# Patient Record
Sex: Female | Born: 1945 | Race: White | Hispanic: No | Smoking: Never smoker
Health system: Southern US, Community
[De-identification: ages and names within clinical notes are randomized; demographics above are authoritative.]

## PROBLEM LIST (undated history)

## (undated) DIAGNOSIS — M1990 Unspecified osteoarthritis, unspecified site: Secondary | ICD-10-CM

## (undated) DIAGNOSIS — I517 Cardiomegaly: Secondary | ICD-10-CM

## (undated) DIAGNOSIS — I10 Essential (primary) hypertension: Secondary | ICD-10-CM

## (undated) DIAGNOSIS — I509 Heart failure, unspecified: Secondary | ICD-10-CM

## (undated) DIAGNOSIS — G4730 Sleep apnea, unspecified: Secondary | ICD-10-CM

## (undated) DIAGNOSIS — T7840XA Allergy, unspecified, initial encounter: Secondary | ICD-10-CM

## (undated) DIAGNOSIS — E785 Hyperlipidemia, unspecified: Secondary | ICD-10-CM

## (undated) DIAGNOSIS — D649 Anemia, unspecified: Secondary | ICD-10-CM

## (undated) HISTORY — DX: Hyperlipidemia, unspecified: E78.5

## (undated) HISTORY — DX: Anemia, unspecified: D64.9

## (undated) HISTORY — DX: Allergy, unspecified, initial encounter: T78.40XA

## (undated) HISTORY — DX: Heart failure, unspecified: I50.9

## (undated) HISTORY — DX: Essential (primary) hypertension: I10

---

## 1974-09-23 HISTORY — PX: BREAST MASS EXCISION: SHX1267

## 1974-09-23 HISTORY — PX: BREAST EXCISIONAL BIOPSY: SUR124

## 2005-03-28 ENCOUNTER — Ambulatory Visit: Payer: Self-pay | Admitting: Family Medicine

## 2005-03-29 ENCOUNTER — Ambulatory Visit: Payer: Self-pay | Admitting: General Surgery

## 2005-03-29 HISTORY — PX: COLONOSCOPY: SHX174

## 2005-04-09 ENCOUNTER — Ambulatory Visit: Payer: Self-pay | Admitting: Family Medicine

## 2005-07-25 HISTORY — PX: OTHER SURGICAL HISTORY: SHX169

## 2006-04-10 ENCOUNTER — Ambulatory Visit: Payer: Self-pay | Admitting: Family Medicine

## 2007-02-19 DIAGNOSIS — R9431 Abnormal electrocardiogram [ECG] [EKG]: Secondary | ICD-10-CM | POA: Insufficient documentation

## 2007-04-14 ENCOUNTER — Ambulatory Visit: Payer: Self-pay | Admitting: Family Medicine

## 2007-04-16 ENCOUNTER — Ambulatory Visit: Payer: Self-pay | Admitting: Family Medicine

## 2007-06-12 DIAGNOSIS — G4700 Insomnia, unspecified: Secondary | ICD-10-CM | POA: Insufficient documentation

## 2008-04-15 DIAGNOSIS — D649 Anemia, unspecified: Secondary | ICD-10-CM | POA: Insufficient documentation

## 2008-04-26 ENCOUNTER — Ambulatory Visit: Payer: Self-pay | Admitting: Family Medicine

## 2009-02-21 ENCOUNTER — Ambulatory Visit: Payer: Self-pay | Admitting: Internal Medicine

## 2009-03-07 ENCOUNTER — Ambulatory Visit: Payer: Self-pay | Admitting: Internal Medicine

## 2009-03-23 ENCOUNTER — Ambulatory Visit: Payer: Self-pay | Admitting: Internal Medicine

## 2009-04-23 ENCOUNTER — Ambulatory Visit: Payer: Self-pay | Admitting: Internal Medicine

## 2009-04-25 ENCOUNTER — Ambulatory Visit: Payer: Self-pay | Admitting: Internal Medicine

## 2009-05-18 ENCOUNTER — Ambulatory Visit: Payer: Self-pay | Admitting: Family Medicine

## 2009-05-24 ENCOUNTER — Ambulatory Visit: Payer: Self-pay | Admitting: Internal Medicine

## 2009-06-23 ENCOUNTER — Ambulatory Visit: Payer: Self-pay | Admitting: Internal Medicine

## 2009-07-24 ENCOUNTER — Ambulatory Visit: Payer: Self-pay | Admitting: Internal Medicine

## 2010-08-01 ENCOUNTER — Ambulatory Visit: Payer: Self-pay | Admitting: Family Medicine

## 2011-10-29 ENCOUNTER — Ambulatory Visit: Payer: Self-pay | Admitting: Family Medicine

## 2012-09-28 LAB — HM PAP SMEAR: HM PAP: NEGATIVE

## 2012-10-01 ENCOUNTER — Ambulatory Visit: Payer: Self-pay | Admitting: Family Medicine

## 2012-10-13 ENCOUNTER — Ambulatory Visit: Payer: Self-pay | Admitting: Family Medicine

## 2012-10-29 ENCOUNTER — Ambulatory Visit: Payer: Self-pay | Admitting: Family Medicine

## 2012-11-04 ENCOUNTER — Ambulatory Visit: Payer: Self-pay | Admitting: Family Medicine

## 2013-12-16 ENCOUNTER — Ambulatory Visit: Payer: Self-pay | Admitting: Family Medicine

## 2014-05-02 DIAGNOSIS — E1169 Type 2 diabetes mellitus with other specified complication: Secondary | ICD-10-CM | POA: Insufficient documentation

## 2014-05-02 DIAGNOSIS — I152 Hypertension secondary to endocrine disorders: Secondary | ICD-10-CM | POA: Insufficient documentation

## 2014-05-02 DIAGNOSIS — I10 Essential (primary) hypertension: Secondary | ICD-10-CM | POA: Insufficient documentation

## 2014-05-02 DIAGNOSIS — G4730 Sleep apnea, unspecified: Secondary | ICD-10-CM | POA: Insufficient documentation

## 2014-05-02 DIAGNOSIS — E785 Hyperlipidemia, unspecified: Secondary | ICD-10-CM | POA: Insufficient documentation

## 2014-05-02 DIAGNOSIS — E1159 Type 2 diabetes mellitus with other circulatory complications: Secondary | ICD-10-CM | POA: Insufficient documentation

## 2014-05-02 DIAGNOSIS — E782 Mixed hyperlipidemia: Secondary | ICD-10-CM | POA: Insufficient documentation

## 2014-05-02 DIAGNOSIS — I429 Cardiomyopathy, unspecified: Secondary | ICD-10-CM | POA: Insufficient documentation

## 2014-09-06 DIAGNOSIS — I5022 Chronic systolic (congestive) heart failure: Secondary | ICD-10-CM | POA: Insufficient documentation

## 2014-12-20 ENCOUNTER — Ambulatory Visit: Payer: Self-pay | Admitting: Family Medicine

## 2014-12-21 ENCOUNTER — Ambulatory Visit: Admit: 2014-12-21 | Disposition: A | Discharge status: Home / Self Care | Payer: Self-pay | Admitting: Family Medicine

## 2015-01-17 ENCOUNTER — Ambulatory Visit (INDEPENDENT_AMBULATORY_CARE_PROVIDER_SITE_OTHER): Payer: Commercial Managed Care - HMO | Admitting: General Surgery

## 2015-01-17 ENCOUNTER — Encounter: Payer: Self-pay | Admitting: General Surgery

## 2015-01-17 VITALS — BP 122/70 | HR 68 | Resp 12 | Ht 66.0 in | Wt 139.0 lb

## 2015-01-17 DIAGNOSIS — Z1211 Encounter for screening for malignant neoplasm of colon: Secondary | ICD-10-CM | POA: Diagnosis not present

## 2015-01-17 MED ORDER — POLYETHYLENE GLYCOL 3350 17 GM/SCOOP PO POWD: 1.0000 | Freq: Once | ORAL | Status: DC

## 2015-01-17 NOTE — Progress Notes (Signed)
Patient ID: Dawn Roberts, female   DOB: 24-Feb-1946, 69 y.o.   MRN: [id number]  Chief Complaint  Patient presents with  . Colonoscopy    HPI Dawn Roberts is a 69 y.o. female. Here today for colonoscopy discussion. Her last colonoscopy was 2006. Denies family history of colon cancer. Bowels move daily and are regular. Denies any gastrointestinal issues.    HPI  Past Medical History  Diagnosis Date  . Hypertension   . Hyperlipidemia     Past Surgical History  Procedure Laterality Date  . Colonoscopy  03-29-05    Dr [name]  . Breast mass excision Right 1976    benign  . Venous closure procedure Right 07-25-05    Dr. [name]    Family History  Problem Relation Age of Onset  . Diabetes Father     Social History History  Substance Use Topics  . Smoking status: Never Smoker   . Smokeless tobacco: Never Used  . Alcohol Use: No    No Known Allergies  Current Outpatient Prescriptions  Medication Sig Dispense Refill  . Calcium-Vitamin D 600-200 MG-UNIT per tablet Take by mouth.    . carvedilol (COREG) 6.25 MG tablet Take 6.25 mg by mouth 2 (two) times daily.  5  . losartan (COZAAR) 50 MG tablet     . Multiple Vitamin (MULTI-VITAMINS) TABS Take by mouth.    . spironolactone (ALDACTONE) 25 MG tablet Take 25 mg by mouth daily.  5   No current facility-administered medications for this visit.    Review of Systems Review of Systems  Constitutional: Negative.   Respiratory: Negative.   Cardiovascular: Negative.   Gastrointestinal: Negative for diarrhea, constipation and blood in stool.    Blood pressure 122/70, pulse 68, resp. rate 12, height 5\' 6"  (1.676 m), weight 63.05 kg (139 lb).  Physical Exam Physical Exam  Constitutional: She is oriented to person, place, and time. She appears well-developed and well-nourished.  Eyes: Conjunctivae are normal. No scleral icterus.  Neck: Neck supple.  Cardiovascular: Normal rate, regular rhythm and normal heart sounds.    Pulmonary/Chest: Effort normal and breath sounds normal.  Abdominal: Soft. Normal appearance. There is no hepatomegaly. There is no tenderness.  Lymphadenopathy:    She has no cervical adenopathy.  Neurological: She is alert and oriented to person, place, and time.  Skin: Skin is warm and dry.    Data Reviewed Office notes, labs and prior colonoscopy.  Assessment    Stable physical exam.    Plan    Colonoscopy with possible biopsy/polypectomy prn: Information regarding the procedure, including its potential risks and complications (including but not limited to perforation of the bowel, which may require emergency surgery to repair, and bleeding) was verbally given to the patient. Educational information regarding lower instestinal endoscopy was given to the patient. Written instructions for how to complete the bowel prep using Miralax were provided. The importance of drinking ample fluids to avoid dehydration as a result of the prep emphasized.  Patient understands and is agreeable.     Patient is scheduled for a colonoscopy at [facility] on 02/01/15. She is aware to pre register with the hospital at least 2 days prior. She will only take her Carvedilol at 6 am with a small sip of water the morning of. Miralax prescription has been sent into her pharmacy. The patient is aware of date and all instructions.   PCP:  Otho Darner 01/17/2015, 9:50 AM

## 2015-01-17 NOTE — Patient Instructions (Addendum)
Colonoscopy A colonoscopy is an exam to look at the entire large intestine (colon). This exam can help find problems such as tumors, polyps, inflammation, and areas of bleeding. The exam takes about 1 hour.  LET [facility] CARE PROVIDER KNOW ABOUT:   Any allergies you have.  All medicines you are taking, including vitamins, herbs, eye drops, creams, and over-the-counter medicines.  Previous problems you or members of your family have had with the use of anesthetics.  Any blood disorders you have.  Previous surgeries you have had.  Medical conditions you have. RISKS AND COMPLICATIONS  Generally, this is a safe procedure. However, as with any procedure, complications can occur. Possible complications include:  Bleeding.  Tearing or rupture of the colon wall.  Reaction to medicines given during the exam.  Infection (rare). BEFORE THE PROCEDURE   Ask your health care provider about changing or stopping your regular medicines.  You may be prescribed an oral bowel prep. This involves drinking a large amount of medicated liquid, starting the day before your procedure. The liquid will cause you to have multiple loose stools until your stool is almost clear or light green. This cleans out your colon in preparation for the procedure.  Do not eat or drink anything else once you have started the bowel prep, unless your health care provider tells you it is safe to do so.  Arrange for someone to drive you home after the procedure. PROCEDURE   You will be given medicine to help you relax (sedative).  You will lie on your side with your knees bent.  A long, flexible tube with a light and camera on the end (colonoscope) will be inserted through the rectum and into the colon. The camera sends video back to a computer screen as it moves through the colon. The colonoscope also releases carbon dioxide gas to inflate the colon. This helps your health care provider see the area better.  During  the exam, your health care provider may take a small tissue sample (biopsy) to be examined under a microscope if any abnormalities are found.  The exam is finished when the entire colon has been viewed. AFTER THE PROCEDURE   Do not drive for 24 hours after the exam.  You may have a small amount of blood in your stool.  You may pass moderate amounts of gas and have mild abdominal cramping or bloating. This is caused by the gas used to inflate your colon during the exam.  Ask when your test results will be ready and how you will get your results. Make sure you get your test results. Document Released: 09/06/2000 Document Revised: 06/30/2013 Document Reviewed: 05/17/2013 [facility] Patient Information 2015 [place], [place]. This information is not intended to replace advice given to you by your health care provider. Make sure you discuss any questions you have with your health care provider.  Patient is scheduled for a colonoscopy at [facility] on 02/01/15. She is aware to pre register with the hospital at least 2 days prior. She will only take her Carvedilol at 6 am with a small sip of water the morning of. Miralax prescription has been sent into her pharmacy. The patient is aware of date and all instructions.

## 2015-01-18 DIAGNOSIS — J309 Allergic rhinitis, unspecified: Secondary | ICD-10-CM | POA: Insufficient documentation

## 2015-01-18 DIAGNOSIS — R7309 Other abnormal glucose: Secondary | ICD-10-CM | POA: Insufficient documentation

## 2015-01-18 DIAGNOSIS — Z6823 Body mass index (BMI) 23.0-23.9, adult: Secondary | ICD-10-CM | POA: Insufficient documentation

## 2015-01-18 DIAGNOSIS — R748 Abnormal levels of other serum enzymes: Secondary | ICD-10-CM | POA: Insufficient documentation

## 2015-01-23 ENCOUNTER — Other Ambulatory Visit: Payer: Self-pay | Admitting: General Surgery

## 2015-01-23 DIAGNOSIS — Z1211 Encounter for screening for malignant neoplasm of colon: Secondary | ICD-10-CM

## 2015-02-01 ENCOUNTER — Ambulatory Visit: Payer: Commercial Managed Care - HMO | Admitting: Anesthesiology

## 2015-02-01 ENCOUNTER — Ambulatory Visit
Admission: RE | Admit: 2015-02-01 | Discharge: 2015-02-01 | Disposition: A | Discharge status: Home / Self Care | Payer: Commercial Managed Care - HMO | Source: Ambulatory Visit | Attending: General Surgery | Admitting: General Surgery

## 2015-02-01 ENCOUNTER — Encounter
Admission: RE | Disposition: A | Discharge status: Home / Self Care | Payer: Self-pay | Source: Ambulatory Visit | Attending: General Surgery

## 2015-02-01 ENCOUNTER — Encounter: Payer: Self-pay | Admitting: General Surgery

## 2015-02-01 DIAGNOSIS — E785 Hyperlipidemia, unspecified: Secondary | ICD-10-CM | POA: Insufficient documentation

## 2015-02-01 DIAGNOSIS — Z1211 Encounter for screening for malignant neoplasm of colon: Secondary | ICD-10-CM | POA: Insufficient documentation

## 2015-02-01 DIAGNOSIS — Z79899 Other long term (current) drug therapy: Secondary | ICD-10-CM | POA: Diagnosis not present

## 2015-02-01 DIAGNOSIS — Z833 Family history of diabetes mellitus: Secondary | ICD-10-CM | POA: Insufficient documentation

## 2015-02-01 DIAGNOSIS — K5730 Diverticulosis of large intestine without perforation or abscess without bleeding: Secondary | ICD-10-CM | POA: Insufficient documentation

## 2015-02-01 DIAGNOSIS — I10 Essential (primary) hypertension: Secondary | ICD-10-CM | POA: Insufficient documentation

## 2015-02-01 DIAGNOSIS — Z9889 Other specified postprocedural states: Secondary | ICD-10-CM | POA: Insufficient documentation

## 2015-02-01 HISTORY — PX: COLONOSCOPY: SHX5424

## 2015-02-01 SURGERY — COLONOSCOPY
Anesthesia: General

## 2015-02-01 MED ORDER — PROPOFOL INFUSION 10 MG/ML OPTIME
INTRAVENOUS | Status: DC | PRN
  Administered 2015-02-01: 200 ug/kg/min via INTRAVENOUS

## 2015-02-01 MED ORDER — LACTATED RINGERS IV SOLN
INTRAVENOUS | Status: DC
  Administered 2015-02-01: 1000 mL via INTRAVENOUS

## 2015-02-01 MED ORDER — LIDOCAINE HCL (CARDIAC) 20 MG/ML IV SOLN
INTRAVENOUS | Status: DC | PRN
  Administered 2015-02-01: 60 mg via INTRAVENOUS

## 2015-02-01 MED ORDER — PROPOFOL 10 MG/ML IV BOLUS
INTRAVENOUS | Status: DC | PRN
  Administered 2015-02-01: 60 mg via INTRAVENOUS

## 2015-02-01 NOTE — Anesthesia Preprocedure Evaluation (Signed)
Anesthesia Evaluation  Patient identified by MRN, date of birth, ID band Patient awake    Reviewed: Allergy & Precautions, NPO status , Patient's Chart, lab work & pertinent test results  Airway Mallampati: II  TM Distance: >3 FB Neck ROM: Full    Dental  (+) Teeth Intact   Pulmonary sleep apnea (Pt Denies) ,          Cardiovascular hypertension, Pt. on medications and Pt. on home beta blockers +CHF (Pt with history of cardiomiopathy. Back to normal now.)     Neuro/Psych    GI/Hepatic   Endo/Other  diabetes (Pt denies)  Renal/GU      Musculoskeletal   Abdominal   Peds  Hematology  (+) anemia ,   Anesthesia Other Findings   Reproductive/Obstetrics                             Anesthesia Physical Anesthesia Plan  ASA: III  Anesthesia Plan: General   Post-op Pain Management:    Induction:   Airway Management Planned: Nasal Cannula  Additional Equipment:   Intra-op Plan:   Post-operative Plan:   Informed Consent: I have reviewed the patients History and Physical, chart, labs and discussed the procedure including the risks, benefits and alternatives for the proposed anesthesia with the patient or authorized representative who has indicated his/her understanding and acceptance.     Plan Discussed with: Anesthesiologist and Surgeon  Anesthesia Plan Comments:         Anesthesia Quick Evaluation

## 2015-02-01 NOTE — Interval H&P Note (Signed)
History and Physical Interval Note:  02/01/2015 8:02 AM  Dawn Roberts  has presented today for surgery, with the diagnosis of SCREENING  The various methods of treatment have been discussed with the patient and family. After consideration of risks, benefits and other options for treatment, the patient has consented to  Procedure(s): COLONOSCOPY (N/A) as a screening procedure.  The patient's history has been reviewed, patient examined, no change in status, stable for surgery.  I have reviewed the patient's chart and labs.  Questions were answered to the patient's satisfaction.     SANKAR,SEEPLAPUTHUR G

## 2015-02-01 NOTE — Op Note (Signed)
[facility] Patient Name: Dawn Roberts Procedure Date: 02/01/2015 8:08 AM MRN: [id number] Account #: [id number] Date of Birth: 04-28-1946 Admit Type: Outpatient Age: 69 Room: [facility] ENDO ROOM 1 Gender: Female Note Status: Finalized Procedure:         Colonoscopy Indications:       Screening for colorectal malignant neoplasm Providers:         Orlie Pollen, MD Referring MD:      Jerrell Belfast, MD (Referring MD) Medicines:         General Anesthesia Complications:     No immediate complications. Procedure:         Pre-Anesthesia Assessment:                    - General anesthesia under the supervision of an                     anesthesiologist was determined to be medically necessary                     for this procedure based on review of the patient's                     medical history, medications, and prior anesthesia history.                    After obtaining informed consent, the colonoscope was                     passed under direct vision. Throughout the procedure, the                     patient's blood pressure, pulse, and oxygen saturations                     were monitored continuously. The Colonoscope was                     introduced through the anus and advanced to the the cecum,                     identified by the ileocecal valve. The colonoscopy was                     performed without difficulty. The patient tolerated the                     procedure well. The quality of the bowel preparation was                     good. Findings:      The perianal and digital rectal examinations were normal.      Many small and large-mouthed diverticula were found in the sigmoid colon       and in the ascending colon.      The exam was otherwise without abnormality. Impression:        - Diverticulosis in the sigmoid colon and in the ascending                     colon.                    - The examination was otherwise  normal.                    -  No specimens collected. Recommendation:    - Use fiber, for example Citrucel, Fibercon, Konsyl or                     Metamucil. Procedure Code(s): --- Professional ---                    [phone], Colonoscopy, flexible; diagnostic, including                     collection of specimen(s) by brushing or washing, when                     performed (separate procedure) Diagnosis Code(s): --- Professional ---                    Z12.11, Encounter for screening for malignant neoplasm of                     colon                    K57.30, Diverticulosis of large intestine without                     perforation or abscess without bleeding CPT copyright 2014 American Medical Association. All rights reserved. The codes documented in this report are preliminary and upon coder review may  be revised to meet current compliance requirements. Orlie Pollen, MD 02/01/2015 8:53:30 AM This report has been signed electronically. Number of Addenda: 0 Note Initiated On: 02/01/2015 8:08 AM Scope Withdrawal Time: 0 hours 3 minutes 27 seconds  Total Procedure Duration: 0 hours 31 minutes 21 seconds       [facility]

## 2015-02-01 NOTE — H&P (View-Only) (Signed)
Patient ID: Dawn Roberts, female   DOB: 11-19-1945, 69 y.o.   MRN: [id number]  Chief Complaint  Patient presents with  . Colonoscopy    HPI Dawn Roberts is a 69 y.o. female. Here today for colonoscopy discussion. Her last colonoscopy was 2006. Denies family history of colon cancer. Bowels move daily and are regular. Denies any gastrointestinal issues.    HPI  Past Medical History  Diagnosis Date  . Hypertension   . Hyperlipidemia     Past Surgical History  Procedure Laterality Date  . Colonoscopy  03-29-05    Dr [name]  . Breast mass excision Right 1976    benign  . Venous closure procedure Right 07-25-05    Dr. [name]    Family History  Problem Relation Age of Onset  . Diabetes Father     Social History History  Substance Use Topics  . Smoking status: Never Smoker   . Smokeless tobacco: Never Used  . Alcohol Use: No    No Known Allergies  Current Outpatient Prescriptions  Medication Sig Dispense Refill  . Calcium-Vitamin D 600-200 MG-UNIT per tablet Take by mouth.    . carvedilol (COREG) 6.25 MG tablet Take 6.25 mg by mouth 2 (two) times daily.  5  . losartan (COZAAR) 50 MG tablet     . Multiple Vitamin (MULTI-VITAMINS) TABS Take by mouth.    . spironolactone (ALDACTONE) 25 MG tablet Take 25 mg by mouth daily.  5   No current facility-administered medications for this visit.    Review of Systems Review of Systems  Constitutional: Negative.   Respiratory: Negative.   Cardiovascular: Negative.   Gastrointestinal: Negative for diarrhea, constipation and blood in stool.    Blood pressure 122/70, pulse 68, resp. rate 12, height 5\' 6"  (1.676 m), weight 63.05 kg (139 lb).  Physical Exam Physical Exam  Constitutional: She is oriented to person, place, and time. She appears well-developed and well-nourished.  Eyes: Conjunctivae are normal. No scleral icterus.  Neck: Neck supple.  Cardiovascular: Normal rate, regular rhythm and normal heart sounds.    Pulmonary/Chest: Effort normal and breath sounds normal.  Abdominal: Soft. Normal appearance. There is no hepatomegaly. There is no tenderness.  Lymphadenopathy:    She has no cervical adenopathy.  Neurological: She is alert and oriented to person, place, and time.  Skin: Skin is warm and dry.    Data Reviewed Office notes, labs and prior colonoscopy.  Assessment    Stable physical exam.    Plan    Colonoscopy with possible biopsy/polypectomy prn: Information regarding the procedure, including its potential risks and complications (including but not limited to perforation of the bowel, which may require emergency surgery to repair, and bleeding) was verbally given to the patient. Educational information regarding lower instestinal endoscopy was given to the patient. Written instructions for how to complete the bowel prep using Miralax were provided. The importance of drinking ample fluids to avoid dehydration as a result of the prep emphasized.  Patient understands and is agreeable.     Patient is scheduled for a colonoscopy at [facility] on 02/01/15. She is aware to pre register with the hospital at least 2 days prior. She will only take her Carvedilol at 6 am with a small sip of water the morning of. Miralax prescription has been sent into her pharmacy. The patient is aware of date and all instructions.   PCP:  Otho Darner 01/17/2015, 9:50 AM

## 2015-02-01 NOTE — Anesthesia Postprocedure Evaluation (Signed)
  Anesthesia Post-op Note  Patient: Dawn Roberts  Procedure(s) Performed: Procedure(s): COLONOSCOPY (N/A)  Anesthesia type:General  Patient location: PACU  Post pain: Pain level controlled  Post assessment: Post-op Vital signs reviewed, Patient's Cardiovascular Status Stable, Respiratory Function Stable, Patent Airway and No signs of Nausea or vomiting  Post vital signs: Reviewed and stable  Last Vitals:  Filed Vitals:   02/01/15 0930  BP: 140/64  Pulse: 58  Temp:   Resp: 11    Level of consciousness: awake, alert  and patient cooperative  Complications: No apparent anesthesia complications

## 2015-02-01 NOTE — Transfer of Care (Signed)
Immediate Anesthesia Transfer of Care Note  Patient: Dawn Roberts  Procedure(s) Performed: Procedure(s): COLONOSCOPY (N/A)  Patient Location: PACU  Anesthesia Type:General  Level of Consciousness: sedated and responds to stimulation  Airway & Oxygen Therapy: Patient Spontanous Breathing and Patient connected to nasal cannula oxygen  Post-op Assessment: Report given to RN and Post -op Vital signs reviewed and stable  Post vital signs: stable  Last Vitals:  Filed Vitals:   02/01/15 0856  BP: 122/51  Pulse:   Temp: 36 C  Resp: 14    Complications: No apparent anesthesia complications

## 2015-02-01 NOTE — Anesthesia Postprocedure Evaluation (Signed)
  Anesthesia Post-op Note  Patient: Dawn Roberts  Procedure(s) Performed: Procedure(s): COLONOSCOPY (N/A)  Anesthesia type:General  Patient location: PACU  Post pain: Pain level controlled  Post assessment: Post-op Vital signs reviewed, Patient's Cardiovascular Status Stable, Respiratory Function Stable, Patent Airway and No signs of Nausea or vomiting  Post vital signs: Reviewed and stable  Last Vitals:  Filed Vitals:   02/01/15 0856  BP: 122/51  Pulse:   Temp: 36 C  Resp: 14    Level of consciousness: awake, alert  and patient cooperative  Complications: No apparent anesthesia complications

## 2015-02-15 ENCOUNTER — Other Ambulatory Visit: Payer: Self-pay

## 2015-03-06 ENCOUNTER — Encounter: Payer: Self-pay | Admitting: Family Medicine

## 2015-03-06 ENCOUNTER — Ambulatory Visit (INDEPENDENT_AMBULATORY_CARE_PROVIDER_SITE_OTHER): Payer: Commercial Managed Care - HMO | Admitting: Family Medicine

## 2015-03-06 VITALS — BP 108/60 | HR 60 | Temp 98.3°F | Resp 16 | Ht 65.0 in | Wt 131.0 lb

## 2015-03-06 DIAGNOSIS — E119 Type 2 diabetes mellitus without complications: Secondary | ICD-10-CM | POA: Diagnosis not present

## 2015-03-06 DIAGNOSIS — I10 Essential (primary) hypertension: Secondary | ICD-10-CM | POA: Diagnosis not present

## 2015-03-06 NOTE — Progress Notes (Signed)
Subjective:    Patient ID: Dawn Roberts, female    DOB: 10-17-1945, 69 y.o.   MRN: [id number]  HPI Comments: Pt's last A1C was on 12/07/2014, and the result was 6.7%.  Has given up her sweet tea.   Diabetes She presents for her follow-up diabetic visit. She has type 2 diabetes mellitus. The initial diagnosis of diabetes was made 3 months ago. There are no hypoglycemic associated symptoms. Pertinent negatives for hypoglycemia include no headaches or sweats. Pertinent negatives for diabetes include no blurred vision, no chest pain, no fatigue, no foot ulcerations, no polydipsia, no polyphagia, no polyuria, no visual change, no weakness and no weight loss. Current diabetic treatment includes diet (exercise). She is compliant with treatment all of the time (Pt has lost almost 20 lbs). She participates in exercise three times a week (walking). There is no compliance with monitoring of blood glucose. She does not see a podiatrist.Eye exam is not current.  Hypertension Pertinent negatives include no anxiety, blurred vision, chest pain, headaches, malaise/fatigue, neck pain, orthopnea, palpitations, peripheral edema, shortness of breath or sweats. Past treatments include alpha 1 blockers and angiotensin blockers (Carvedilol 6.25mg  bid, Cozaar 50mg ). Hypertensive end-organ damage includes heart failure.      Review of Systems  Constitutional: Negative for weight loss, malaise/fatigue and fatigue.  Eyes: Negative for blurred vision.  Respiratory: Negative for shortness of breath.   Cardiovascular: Negative for chest pain, palpitations and orthopnea.  Endocrine: Negative for polydipsia, polyphagia and polyuria.  Musculoskeletal: Negative for neck pain.  Neurological: Negative for weakness and headaches.     Patient Active Problem List   Diagnosis Date Noted  . Abnormal blood sugar 01/18/2015  . Allergic rhinitis 01/18/2015  . Body mass index (BMI) of 23.0-23.9 in adult 01/18/2015  .  Controlled diabetes mellitus type II without complication 09/47/0962  . Abnormal liver enzymes 01/18/2015  . Chronic systolic heart failure 83/66/2947  . Cardiomyopathy 05/02/2014  . Essential (primary) hypertension 05/02/2014  . Apnea, sleep 05/02/2014  . Combined fat and carbohydrate induced hyperlipemia 05/02/2014  . Absolute anemia 04/15/2008  . Cannot sleep 06/12/2007  . CCF (congestive cardiac failure) 05/11/2007  . Abnormal ECG 02/19/2007   Past Medical History  Diagnosis Date  . Hypertension   . Hyperlipidemia   . CHF (congestive heart failure)   . Allergy   . Anemia    Current Outpatient Prescriptions on File Prior to Visit  Medication Sig  . Calcium-Vitamin D 600-200 MG-UNIT per tablet Take 1 tablet by mouth 2 (two) times daily.   . carvedilol (COREG) 6.25 MG tablet Take 6.25 mg by mouth 2 (two) times daily.  . cyanocobalamin 1000 MCG tablet Take 1,000 mcg by mouth daily.   Marland Kitchen losartan (COZAAR) 50 MG tablet Take 50 mg by mouth daily.   . Multiple Vitamin (MULTI-VITAMINS) TABS Take 1 tablet by mouth daily.   Marland Kitchen spironolactone (ALDACTONE) 25 MG tablet Take 25 mg by mouth daily.   No current facility-administered medications on file prior to visit.   No Known Allergies Past Surgical History  Procedure Laterality Date  . Colonoscopy  03-29-05    Dr [name]  . Breast mass excision Right 1976    benign  . Venous closure procedure Right 07-25-05    Dr. [name]  . Colonoscopy N/A 02/01/2015    Procedure: COLONOSCOPY;  Surgeon: [name], MD;  Location: [facility] ENDOSCOPY;  Service: Endoscopy;  Laterality: N/A;   History   Social History  . Marital Status: Divorced  Spouse Name: N/A  . Number of Children: N/A  . Years of Education: N/A   Occupational History  . Not on file.   Social History Main Topics  . Smoking status: Never Smoker   . Smokeless tobacco: Never Used  . Alcohol Use: No  . Drug Use: No  . Sexual Activity: Not on file   Other Topics  Concern  . Not on file   Social History Narrative   Family History  Problem Relation Age of Onset  . Diabetes Father   . Ovarian cancer Sister           Objective:   Physical Exam  Constitutional: She is oriented to person, place, and time. She appears well-developed and well-nourished.  Neurological: She is alert and oriented to person, place, and time.  Psychiatric: She has a normal mood and affect. Her behavior is normal. Judgment and thought content normal.    BP 108/60 mmHg  Pulse 60  Temp(Src) 98.3 F (36.8 C) (Oral)  Resp 16  Ht 5\' 5"  (1.651 m)  Wt 131 lb (59.421 kg)  BMI 21.80 kg/m2       Assessment & Plan:  1. Controlled diabetes mellitus type II without complication Has lost 20 pounds. Too early for A1c.  Continue current lifestyle changes and recheck in 3 months.   2. Essential (primary) hypertension Condition is stable. Please continue current medication and  plan of care as noted.  Follow up with cardiology tomorrow.   Margarita Rana, MD

## 2015-03-07 DIAGNOSIS — R001 Bradycardia, unspecified: Secondary | ICD-10-CM | POA: Insufficient documentation

## 2015-03-16 ENCOUNTER — Encounter: Payer: Self-pay | Admitting: General Surgery

## 2015-06-12 ENCOUNTER — Encounter: Payer: Self-pay | Admitting: Family Medicine

## 2015-06-12 ENCOUNTER — Ambulatory Visit (INDEPENDENT_AMBULATORY_CARE_PROVIDER_SITE_OTHER): Payer: Commercial Managed Care - HMO | Admitting: Family Medicine

## 2015-06-12 VITALS — BP 116/60 | HR 60 | Temp 97.9°F | Resp 16 | Ht 66.0 in | Wt 129.0 lb

## 2015-06-12 DIAGNOSIS — Z23 Encounter for immunization: Secondary | ICD-10-CM | POA: Diagnosis not present

## 2015-06-12 DIAGNOSIS — E119 Type 2 diabetes mellitus without complications: Secondary | ICD-10-CM | POA: Diagnosis not present

## 2015-06-12 DIAGNOSIS — R7309 Other abnormal glucose: Secondary | ICD-10-CM | POA: Diagnosis not present

## 2015-06-12 DIAGNOSIS — I10 Essential (primary) hypertension: Secondary | ICD-10-CM | POA: Diagnosis not present

## 2015-06-12 LAB — POCT UA - MICROALBUMIN: MICROALBUMIN (UR) POC: 20 mg/L

## 2015-06-12 LAB — POCT GLYCOSYLATED HEMOGLOBIN (HGB A1C): HEMOGLOBIN A1C: 6.2

## 2015-06-12 NOTE — Progress Notes (Signed)
Patient ID: Dawn Roberts, female   DOB: 11-11-1945, 69 y.o.   MRN: [id number]        Patient: Dawn Roberts Female    DOB: [birth year]   69 y.o.   MRN: [id number] Visit Date: 06/12/2015  Today's Provider: Margarita Rana, MD   Chief Complaint  Patient presents with  . Diabetes  . Hypertension   Subjective:    HPI  Diabetes Mellitus Type II, Follow-up:   Lab Results  Component Value Date   HGBA1C 6.2 06/12/2015    Last seen for diabetes 3 months ago.  Management since then includes checking A1c 6.7% on 12/07/2014. She reports excellent compliance with treatment. She is not having side effects.  Current symptoms include none and have been stable. Home blood sugar records: are not being checked.  Episodes of hypoglycemia? no   Current Insulin Regimen: none Most Recent Eye Exam: over a yr ago. AEC Weight trend: decreasing steadily Prior visit with dietician: no Current diet: in general, a "healthy" diet   Current exercise: walking  Pertinent Labs: No results found for: CHOL, TRIG, CHOLHDL, CREATININE  Wt Readings from Last 3 Encounters:  06/12/15 129 lb (58.514 kg)  03/06/15 131 lb (59.421 kg)  02/01/15 136 lb (61.689 kg)    ------------------------------------------------------------------------      Hypertension, follow-up:  BP Readings from Last 3 Encounters:  06/12/15 116/60  03/06/15 108/60  02/01/15 140/64    She was last seen for hypertension 3 months ago.  BP at that visit was 108/60. Management changes since that visit include none. She reports excellent compliance with treatment. She is not having side effects.  She is exercising. She is adherent to low salt diet.   Outside blood pressures are are not being checked. She is experiencing none.  Patient denies chest pain.   Cardiovascular risk factors include none.   ------------------------------------------------------------------------       No Known Allergies Previous  Medications   CALCIUM-VITAMIN D 600-200 MG-UNIT PER TABLET    Take 1 tablet by mouth 2 (two) times daily.    CARVEDILOL (COREG) 6.25 MG TABLET    Take 6.25 mg by mouth 2 (two) times daily.   CYANOCOBALAMIN 1000 MCG TABLET    Take 1,000 mcg by mouth daily.    LOSARTAN (COZAAR) 50 MG TABLET    Take 50 mg by mouth daily.    MULTIPLE VITAMIN (MULTI-VITAMINS) TABS    Take 1 tablet by mouth daily.    SPIRONOLACTONE (ALDACTONE) 25 MG TABLET    Take 25 mg by mouth daily.   TRETINOIN (RETIN-A) 0.1 % CREAM    APPLY 1 APPLICATION ON THE SKIN AT BEDTIME    Review of Systems  Constitutional: Negative.   HENT: Negative.   Respiratory: Negative.   Cardiovascular: Negative.   Gastrointestinal: Negative.   Endocrine: Negative.   Genitourinary: Negative.   Skin: Negative.   Neurological: Negative.   Psychiatric/Behavioral: Negative.     Social History  Substance Use Topics  . Smoking status: Never Smoker   . Smokeless tobacco: Never Used  . Alcohol Use: No   Objective:   BP 116/60 mmHg  Pulse 60  Temp(Src) 97.9 F (36.6 C) (Oral)  Resp 16  Ht 5\' 6"  (1.676 m)  Wt 129 lb (58.514 kg)  BMI 20.83 kg/m2  Physical Exam  Constitutional: She appears well-developed and well-nourished.  Cardiovascular: Normal rate, regular rhythm, normal heart sounds and intact distal pulses.   Pulmonary/Chest: Effort normal and breath sounds normal.  Diabetic Foot Exam - Simple   Simple Foot Form  Diabetic Foot exam was performed with the following findings:  Yes 06/12/2015 11:58 AM  Visual Inspection  No deformities, no ulcerations, no other skin breakdown bilaterally:  Yes  Sensation Testing  Pulse Check  Posterior Tibialis and Dorsalis pulse intact bilaterally:  Yes  Comments           Assessment & Plan:     1. Abnormal blood sugar As below.   - POCT glycosylated hemoglobin (Hb A1C)  2. Essential (primary) hypertension Stable. Continue current medication.    3. Diabetes mellitus with no  complication Greatly improved. Continue current plan of care.  Monitor for signs and symptoms of type 1. Recheck in 4 months.  - POCT UA - Microalbumin  4. Need for influenza vaccination - Flu vaccine HIGH DOSE PF     Patient was seen and examined by Jerrell Belfast, MD, and note scribed by Lynford Humphrey, [place].   I have reviewed the document for accuracy and completeness and I agree with above. - Jerrell Belfast, MD     Margarita Rana, MD  [facility]

## 2015-10-17 ENCOUNTER — Ambulatory Visit: Payer: Commercial Managed Care - HMO | Admitting: Family Medicine

## 2015-10-24 ENCOUNTER — Encounter: Payer: Self-pay | Admitting: Family Medicine

## 2015-10-24 ENCOUNTER — Ambulatory Visit (INDEPENDENT_AMBULATORY_CARE_PROVIDER_SITE_OTHER): Payer: PPO | Admitting: Family Medicine

## 2015-10-24 VITALS — BP 136/60 | HR 68 | Temp 98.1°F | Resp 16 | Wt 129.0 lb

## 2015-10-24 DIAGNOSIS — I10 Essential (primary) hypertension: Secondary | ICD-10-CM

## 2015-10-24 DIAGNOSIS — E119 Type 2 diabetes mellitus without complications: Secondary | ICD-10-CM

## 2015-10-24 LAB — POCT GLYCOSYLATED HEMOGLOBIN (HGB A1C)
Est. average glucose Bld gHb Est-mCnc: 137
Hemoglobin A1C: 6.4

## 2015-10-24 NOTE — Progress Notes (Signed)
Subjective:    Patient ID: Dawn Roberts, female    DOB: 17-Dec-1945, 70 y.o.   MRN: [id number]  Diabetes She presents for her follow-up (Last A1C 06/12/2015 and was 6.2%) diabetic visit. There are no hypoglycemic associated symptoms. Pertinent negatives for diabetes include no blurred vision, no chest pain, no fatigue, no foot paresthesias, no foot ulcerations, no polydipsia, no polyphagia, no polyuria, no visual change and no weakness. Risk factors for coronary artery disease include diabetes mellitus, hypertension, post-menopausal and family history. When asked about current treatments, none were reported. She is following a generally healthy diet. She participates in exercise three times a week (joined a gym). Home blood sugar record trend: BS not being checked. An ACE inhibitor/angiotensin II receptor blocker is being taken. Eye exam is not current.  Hypertension This is a chronic problem. The problem is controlled. Pertinent negatives include no anxiety, blurred vision, chest pain, malaise/fatigue, neck pain, orthopnea, palpitations, peripheral edema or shortness of breath. Treatments tried: Carvedilol 6.25 mg BID, Losartan 50 mg, Spironolactone 25 mg. There are no compliance problems.     Review of Systems  Constitutional: Negative for malaise/fatigue and fatigue.  Eyes: Negative for blurred vision.  Respiratory: Negative for shortness of breath.   Cardiovascular: Negative for chest pain, palpitations and orthopnea.  Endocrine: Negative for polydipsia, polyphagia and polyuria.  Musculoskeletal: Negative for neck pain.  Neurological: Negative for weakness.   BP 136/60 mmHg  Pulse 68  Temp(Src) 98.1 F (36.7 C) (Oral)  Resp 16  Wt 129 lb (58.514 kg)   Patient Active Problem List   Diagnosis Date Noted  . Diabetes mellitus with no complication ([place]) [id number]  . Bradycardia 03/07/2015  . Abnormal blood sugar 01/18/2015  . Allergic rhinitis 01/18/2015  . Body mass index (BMI)  of 23.0-23.9 in adult 01/18/2015  . Controlled diabetes mellitus type II without complication ([place]) [id number]  . Abnormal liver enzymes 01/18/2015  . Chronic systolic heart failure ([place]) 09/06/2014  . Cardiomyopathy ([place]) 05/02/2014  . Essential (primary) hypertension 05/02/2014  . Apnea, sleep 05/02/2014  . Combined fat and carbohydrate induced hyperlipemia 05/02/2014  . Absolute anemia 04/15/2008  . Cannot sleep 06/12/2007  . CCF (congestive cardiac failure) ([place]) 05/11/2007  . Abnormal ECG 02/19/2007   Past Medical History  Diagnosis Date  . Hypertension   . Hyperlipidemia   . CHF (congestive heart failure) ([place])   . Allergy   . Anemia    Current Outpatient Prescriptions on File Prior to Visit  Medication Sig  . Calcium-Vitamin D 600-200 MG-UNIT per tablet Take 1 tablet by mouth 2 (two) times daily.   . carvedilol (COREG) 6.25 MG tablet Take 6.25 mg by mouth 2 (two) times daily.  . cyanocobalamin 1000 MCG tablet Take 1,000 mcg by mouth daily.   Marland Kitchen losartan (COZAAR) 50 MG tablet Take 50 mg by mouth daily.   . Multiple Vitamin (MULTI-VITAMINS) TABS Take 1 tablet by mouth daily.   Marland Kitchen spironolactone (ALDACTONE) 25 MG tablet Take 25 mg by mouth daily.  Marland Kitchen tretinoin (RETIN-A) 0.1 % cream APPLY 1 APPLICATION ON THE SKIN AT BEDTIME   No current facility-administered medications on file prior to visit.   No Known Allergies Past Surgical History  Procedure Laterality Date  . Colonoscopy  03-29-05    Dr [name]  . Breast mass excision Right 1976    benign  . Venous closure procedure Right 07-25-05    Dr. [name]  . Colonoscopy N/A 02/01/2015    Procedure: COLONOSCOPY;  Surgeon:  Seeplaputhur Robinette Haines, MD;  Location: [facility] ENDOSCOPY;  Service: Endoscopy;  Laterality: N/A;   Social History   Social History  . Marital Status: Divorced    Spouse Name: N/A  . Number of Children: N/A  . Years of Education: N/A   Occupational History  . Not on file.   Social History Main Topics  .  Smoking status: Never Smoker   . Smokeless tobacco: Never Used  . Alcohol Use: No  . Drug Use: No  . Sexual Activity: Not on file   Other Topics Concern  . Not on file   Social History Narrative   Family History  Problem Relation Age of Onset  . Diabetes Father   . Ovarian cancer Sister       Objective:   Physical Exam  Constitutional: She appears well-developed and well-nourished.  Cardiovascular: Normal rate and regular rhythm.   Pulmonary/Chest: Effort normal and breath sounds normal.  Psychiatric: She has a normal mood and affect. Her behavior is normal.  BP 136/60 mmHg  Pulse 68  Temp(Src) 98.1 F (36.7 C) (Oral)  Resp 16  Wt 129 lb (58.514 kg)     Assessment & Plan:  1. Controlled type 2 diabetes mellitus without complication, without long-term current use of insulin (HCC) Controlled. Continue current plan of care. Will recheck at CPE in May. - POCT glycosylated hemoglobin (Hb A1C) Results for orders placed or performed in visit on 10/24/15  POCT glycosylated hemoglobin (Hb A1C)  Result Value Ref Range   Hemoglobin A1C 6.4    Est. average glucose Bld gHb Est-mCnc 137     2. Essential (primary) hypertension Stable. Continue current medications and plan of care.   Patient seen and examined by Jerrell Belfast, MD, and note scribed by Renaldo Fiddler, CMA.  I have reviewed the document for accuracy and completeness and I agree with above. Jerrell Belfast, MD   Margarita Rana, MD

## 2015-12-25 ENCOUNTER — Encounter: Payer: Commercial Managed Care - HMO | Admitting: Family Medicine

## 2016-01-24 ENCOUNTER — Ambulatory Visit (INDEPENDENT_AMBULATORY_CARE_PROVIDER_SITE_OTHER): Payer: PPO | Admitting: Family Medicine

## 2016-01-24 ENCOUNTER — Encounter: Payer: PPO | Admitting: Family Medicine

## 2016-01-24 ENCOUNTER — Encounter: Payer: Self-pay | Admitting: Family Medicine

## 2016-01-24 VITALS — BP 120/60 | HR 60 | Temp 97.5°F | Resp 16 | Ht 65.0 in | Wt 127.0 lb

## 2016-01-24 DIAGNOSIS — E782 Mixed hyperlipidemia: Secondary | ICD-10-CM

## 2016-01-24 DIAGNOSIS — I5022 Chronic systolic (congestive) heart failure: Secondary | ICD-10-CM

## 2016-01-24 DIAGNOSIS — E119 Type 2 diabetes mellitus without complications: Secondary | ICD-10-CM

## 2016-01-24 DIAGNOSIS — Z0000 Encounter for general adult medical examination without abnormal findings: Secondary | ICD-10-CM | POA: Diagnosis not present

## 2016-01-24 DIAGNOSIS — I10 Essential (primary) hypertension: Secondary | ICD-10-CM

## 2016-01-24 DIAGNOSIS — I429 Cardiomyopathy, unspecified: Secondary | ICD-10-CM

## 2016-01-24 DIAGNOSIS — Z1239 Encounter for other screening for malignant neoplasm of breast: Secondary | ICD-10-CM

## 2016-01-24 NOTE — Progress Notes (Signed)
Patient ID: ELLYSE FAILLE, female   DOB: 12-26-1945, 70 y.o.   MRN: [id number]       Patient: Dawn Roberts, Female    DOB: 06/09/1946, 70 y.o.   MRN: [id number] Visit Date: 01/24/2016  Today's Provider: Margarita Rana, MD   Chief Complaint  Patient presents with  . Medicare Wellness   Subjective:    Annual wellness visit Dawn Roberts is a 70 y.o. female. She feels well. She reports exercising 3 times a week. She reports she is sleeping well.  12/07/14 AWE 10/08/12 Pap-neg; HPV-neg 12/21/14 Mammogram-BI-RADS 1 12/21/14 BMD-NORMAL 02/01/15 Colonoscopy-diverticulosis -----------------------------------------------------------  Review of Systems  Constitutional: Negative.   HENT: Negative.   Eyes: Negative.   Respiratory: Negative.   Cardiovascular: Negative.   Gastrointestinal: Negative.   Endocrine: Negative.   Genitourinary: Negative.   Musculoskeletal: Negative.   Skin: Negative.   Allergic/Immunologic: Negative.   Neurological: Negative.   Hematological: Negative.   Psychiatric/Behavioral: Negative.    Social History   Social History  . Marital Status: Divorced    Spouse Name: N/A  . Number of Children: N/A  . Years of Education: N/A   Occupational History  . Not on file.   Social History Main Topics  . Smoking status: Never Smoker   . Smokeless tobacco: Never Used  . Alcohol Use: No  . Drug Use: No  . Sexual Activity: Not on file   Other Topics Concern  . Not on file   Social History Narrative    Past Medical History  Diagnosis Date  . Hypertension   . Hyperlipidemia   . CHF (congestive heart failure) ([place])   . Allergy   . Anemia      Patient Active Problem List   Diagnosis Date Noted  . Diabetes mellitus with no complication ([place]) [id number]  . Bradycardia 03/07/2015  . Abnormal blood sugar 01/18/2015  . Allergic rhinitis 01/18/2015  . Body mass index (BMI) of 23.0-23.9 in adult 01/18/2015  . Controlled diabetes mellitus type II  without complication ([place]) [id number]  . Abnormal liver enzymes 01/18/2015  . Chronic systolic heart failure ([place]) 09/06/2014  . Cardiomyopathy ([place]) 05/02/2014  . Essential (primary) hypertension 05/02/2014  . Apnea, sleep 05/02/2014  . Combined fat and carbohydrate induced hyperlipemia 05/02/2014  . Absolute anemia 04/15/2008  . Cannot sleep 06/12/2007  . CCF (congestive cardiac failure) ([place]) 05/11/2007  . Abnormal ECG 02/19/2007    Past Surgical History  Procedure Laterality Date  . Colonoscopy  03-29-05    Dr [name]  . Breast mass excision Right 1976    benign  . Venous closure procedure Right 07-25-05    Dr. [name]  . Colonoscopy N/A 02/01/2015    Procedure: COLONOSCOPY;  Surgeon: [name], MD;  Location: [facility] ENDOSCOPY;  Service: Endoscopy;  Laterality: N/A;    Her family history includes Diabetes in her father; Ovarian cancer in her sister.    Previous Medications   CALCIUM-VITAMIN D 600-200 MG-UNIT PER TABLET    Take 1 tablet by mouth 2 (two) times daily.    CARVEDILOL (COREG) 6.25 MG TABLET    Take 6.25 mg by mouth 2 (two) times daily.   CYANOCOBALAMIN 1000 MCG TABLET    Take 1,000 mcg by mouth daily.    LOSARTAN (COZAAR) 50 MG TABLET    Take 50 mg by mouth daily.    MULTIPLE VITAMIN (MULTI-VITAMINS) TABS    Take 1 tablet by mouth daily.    SPIRONOLACTONE (ALDACTONE) 25 MG TABLET  Take 25 mg by mouth daily.   TRETINOIN (RETIN-A) 0.1 % CREAM    APPLY 1 APPLICATION ON THE SKIN AT BEDTIME    Patient Care Team: Margarita Rana, MD as PCP - General (Family Medicine) Seeplaputhur Robinette Haines, MD (General Surgery) Margarita Rana, MD as Referring Physician (Family Medicine)     Objective:   Vitals: BP 120/60 mmHg  Pulse 60  Temp(Src) 97.5 F (36.4 C) (Oral)  Resp 16  Ht 5\' 5"  (1.651 m)  Wt 127 lb (57.607 kg)  BMI 21.13 kg/m2  Physical Exam  Constitutional: She is oriented to person, place, and time. She appears well-developed and well-nourished.  HENT:    Head: Normocephalic and atraumatic.  Right Ear: Tympanic membrane, external ear and ear canal normal.  Left Ear: Tympanic membrane, external ear and ear canal normal.  Nose: Nose normal.  Mouth/Throat: Uvula is midline, oropharynx is clear and moist and mucous membranes are normal.  Eyes: Conjunctivae, EOM and lids are normal. Pupils are equal, round, and reactive to light.  Neck: Trachea normal and normal range of motion. Neck supple. Carotid bruit is not present. No thyroid mass and no thyromegaly present.  Cardiovascular: Normal rate, regular rhythm and normal heart sounds.   Pulmonary/Chest: Effort normal and breath sounds normal.  Abdominal: Soft. Normal appearance and bowel sounds are normal. There is no hepatosplenomegaly. There is no tenderness.  Genitourinary: No breast swelling, tenderness or discharge.  Musculoskeletal: Normal range of motion.  Lymphadenopathy:    She has no cervical adenopathy.    She has no axillary adenopathy.  Neurological: She is alert and oriented to person, place, and time. She has normal strength. No cranial nerve deficit.  Skin: Skin is warm, dry and intact.  Psychiatric: She has a normal mood and affect. Her speech is normal and behavior is normal. Judgment and thought content normal. Cognition and memory are normal.    Activities of Daily Living In your present state of health, do you have any difficulty performing the following activities: 01/24/2016 03/06/2015  Hearing? N N  Vision? N N  Difficulty concentrating or making decisions? N N  Walking or climbing stairs? N N  Dressing or bathing? N N  Doing errands, shopping? N N    Fall Risk Assessment Fall Risk  01/24/2016 03/06/2015  Falls in the past year? No No     Depression Screen PHQ 2/9 Scores 01/24/2016 03/06/2015  PHQ - 2 Score 0 0    Cognitive Testing - 6-CIT  Correct? Score   What year is it? yes 0 0 or 4  What month is it? yes 0 0 or 3  Memorize:    Dawn Roberts,  41,  [address],   [place],      What time is it? (within 1 hour) yes 0 0 or 3  Count backwards from 20 yes 0 0, 2, or 4  Name the months of the year yes 0 0, 2, or 4  Repeat name & address above yes 2 0, 2, 4, 6, 8, or 10       TOTAL SCORE  2/28   Interpretation:  Normal  Normal (0-7) Abnormal (8-28)     Assessment & Plan:     Annual Wellness Visit  Reviewed patient's Family Medical History Reviewed and updated list of patient's medical providers Assessment of cognitive impairment was done Assessed patient's functional ability Established a written schedule for health screening [place] Completed and Reviewed  Exercise Activities and Dietary  recommendations Goals    . HEMOGLOBIN A1C < 7.0       Immunization History  Administered Date(s) Administered  . Influenza, High Dose Seasonal PF 06/12/2015  . Pneumococcal Conjugate-13 12/07/2014  . Pneumococcal Polysaccharide-23 09/26/2011  . Tdap 02/10/2006      1. Medicare annual wellness visit, subsequent As above.  2. Breast cancer screening - MM DIGITAL SCREENING BILATERAL; Future  3. Essential (primary) hypertension - CBC with Differential/Platelet - Comprehensive metabolic panel - TSH  4. Diabetes mellitus with no complication (HCC) - Hemoglobin A1c  5. Combined fat and carbohydrate induced hyperlipemia - Lipid Panel With LDL/HDL Ratio - TSH  6. Chronic systolic heart failure (HCC) Stable. Patient advised to keep follow-up with Dr. Nehemiah Massed.  7. Cardiomyopathy ([place]) Stable. Patient advised to keep follow-up with Dr. Nehemiah Massed.    Patient seen and examined by Dr. Jerrell Belfast, and note scribed by Philbert Riser. [place], CMA.  I have reviewed the document for accuracy and completeness and I agree with above. Jerrell Belfast, MD   Margarita Rana, MD   ------------------------------------------------------------------------------------------------------------

## 2016-01-25 ENCOUNTER — Telehealth: Payer: Self-pay

## 2016-01-25 LAB — COMPREHENSIVE METABOLIC PANEL
A/G RATIO: 2.2 (ref 1.2–2.2)
ALK PHOS: 33 IU/L — AB (ref 39–117)
ALT: 17 IU/L (ref 0–32)
AST: 17 IU/L (ref 0–40)
Albumin: 4.3 g/dL (ref 3.5–4.8)
BILIRUBIN TOTAL: 0.3 mg/dL (ref 0.0–1.2)
BUN/Creatinine Ratio: 35 — ABNORMAL HIGH (ref 12–28)
BUN: 30 mg/dL — AB (ref 8–27)
CO2: 24 mmol/L (ref 18–29)
CREATININE: 0.85 mg/dL (ref 0.57–1.00)
Calcium: 9.8 mg/dL (ref 8.7–10.3)
Chloride: 103 mmol/L (ref 96–106)
GFR calc Af Amer: 80 mL/min/{1.73_m2} (ref >59)
GFR, EST NON AFRICAN AMERICAN: 70 mL/min/{1.73_m2} (ref >59)
Globulin, Total: 2 g/dL (ref 1.5–4.5)
Glucose: 99 mg/dL (ref 65–99)
Potassium: 5.2 mmol/L (ref 3.5–5.2)
SODIUM: 144 mmol/L (ref 134–144)
TOTAL PROTEIN: 6.3 g/dL (ref 6.0–8.5)

## 2016-01-25 LAB — CBC WITH DIFFERENTIAL/PLATELET
BASOS: 1 %
Basophils Absolute: 0 10*3/uL (ref 0.0–0.2)
EOS (ABSOLUTE): 0 10*3/uL (ref 0.0–0.4)
Eos: 1 %
Hematocrit: 36.2 % (ref 34.0–46.6)
Hemoglobin: 11.7 g/dL (ref 11.1–15.9)
IMMATURE GRANS (ABS): 0 10*3/uL (ref 0.0–0.1)
Immature Granulocytes: 0 %
Lymphocytes Absolute: 2 10*3/uL (ref 0.7–3.1)
Lymphs: 46 %
MCH: 29.1 pg (ref 26.6–33.0)
MCHC: 32.3 g/dL (ref 31.5–35.7)
MCV: 90 fL (ref 79–97)
MONOS ABS: 0.2 10*3/uL (ref 0.1–0.9)
Monocytes: 6 %
NEUTROS ABS: 1.9 10*3/uL (ref 1.4–7.0)
Neutrophils: 46 %
PLATELETS: 251 10*3/uL (ref 150–379)
RBC: 4.02 x10E6/uL (ref 3.77–5.28)
RDW: 13.6 % (ref 12.3–15.4)
WBC: 4.1 10*3/uL (ref 3.4–10.8)

## 2016-01-25 LAB — HEMOGLOBIN A1C
ESTIMATED AVERAGE GLUCOSE: 134 mg/dL
HEMOGLOBIN A1C: 6.3 % — AB (ref 4.8–5.6)

## 2016-01-25 LAB — LIPID PANEL WITH LDL/HDL RATIO
Cholesterol, Total: 144 mg/dL (ref 100–199)
HDL: 51 mg/dL (ref >39)
LDL Calculated: 85 mg/dL (ref 0–99)
LDL/HDL RATIO: 1.7 ratio (ref 0.0–3.2)
TRIGLYCERIDES: 41 mg/dL (ref 0–149)
VLDL CHOLESTEROL CAL: 8 mg/dL (ref 5–40)

## 2016-01-25 LAB — TSH: TSH: 0.939 u[IU]/mL (ref 0.450–4.500)

## 2016-01-25 NOTE — Telephone Encounter (Signed)
-----   Message from Margarita Rana, MD sent at 01/25/2016 10:36 AM EDT ----- Labs stable. Please notify patient. Thanks.

## 2016-01-25 NOTE — Telephone Encounter (Signed)
Patient advised as directed below. Patient verbalized understanding.  

## 2016-01-29 ENCOUNTER — Telehealth: Payer: Self-pay | Admitting: Family Medicine

## 2016-01-31 ENCOUNTER — Ambulatory Visit
Admission: RE | Admit: 2016-01-31 | Discharge: 2016-01-31 | Disposition: A | Discharge status: Home / Self Care | Payer: PPO | Source: Ambulatory Visit | Attending: Family Medicine | Admitting: Family Medicine

## 2016-01-31 ENCOUNTER — Other Ambulatory Visit: Payer: Self-pay | Admitting: Family Medicine

## 2016-01-31 DIAGNOSIS — Z1239 Encounter for other screening for malignant neoplasm of breast: Secondary | ICD-10-CM

## 2016-01-31 DIAGNOSIS — Z1231 Encounter for screening mammogram for malignant neoplasm of breast: Secondary | ICD-10-CM | POA: Diagnosis not present

## 2016-03-06 DIAGNOSIS — I5022 Chronic systolic (congestive) heart failure: Secondary | ICD-10-CM | POA: Diagnosis not present

## 2016-03-06 DIAGNOSIS — E782 Mixed hyperlipidemia: Secondary | ICD-10-CM | POA: Diagnosis not present

## 2016-03-06 DIAGNOSIS — I10 Essential (primary) hypertension: Secondary | ICD-10-CM | POA: Diagnosis not present

## 2016-03-06 DIAGNOSIS — G4730 Sleep apnea, unspecified: Secondary | ICD-10-CM | POA: Diagnosis not present

## 2016-03-06 DIAGNOSIS — R072 Precordial pain: Secondary | ICD-10-CM | POA: Diagnosis not present

## 2016-03-06 DIAGNOSIS — I429 Cardiomyopathy, unspecified: Secondary | ICD-10-CM | POA: Diagnosis not present

## 2016-03-06 DIAGNOSIS — R001 Bradycardia, unspecified: Secondary | ICD-10-CM | POA: Diagnosis not present

## 2016-03-27 ENCOUNTER — Encounter: Payer: Self-pay | Admitting: Family Medicine

## 2016-03-27 ENCOUNTER — Ambulatory Visit (INDEPENDENT_AMBULATORY_CARE_PROVIDER_SITE_OTHER): Payer: PPO | Admitting: Family Medicine

## 2016-03-27 VITALS — BP 100/58 | HR 72 | Temp 97.8°F | Resp 16 | Ht 65.0 in | Wt 126.0 lb

## 2016-03-27 DIAGNOSIS — L259 Unspecified contact dermatitis, unspecified cause: Secondary | ICD-10-CM | POA: Diagnosis not present

## 2016-03-27 MED ORDER — PREDNISONE 20 MG PO TABS: ORAL_TABLET | ORAL | Status: DC

## 2016-03-27 NOTE — Progress Notes (Signed)
Subjective:     Patient ID: Dawn Roberts, female   DOB: [birth year], 70 y.o.   MRN: [id number]  HPI  Chief Complaint  Patient presents with  . Rash    on arms x's 3  States she was pulling out weeds and vines in her yard on 7/1. Rash appeared on her arms and subsequently on her right side of her face on 7/3.   Review of Systems     Objective:   Physical Exam  Constitutional: She appears well-developed and well-nourished. No distress.  Skin:  Right face with patch of rash. Upper extremities with vesicles and papules (right > left).       Assessment:    1. Contact dermatitis - predniSONE (DELTASONE) 20 MG tablet; Taper as follows: 3 pills for 4 days, two pills for 4 days, one pill for four days  Dispense: 24 tablet; Refill: 0    Plan:   Discussed use of calamine and oral Benadryl.

## 2016-03-27 NOTE — Patient Instructions (Signed)
Use plain calamine lotion for itching and reducing blisters. May take Benadryl to night to help with itching and sleep.

## 2016-05-15 DIAGNOSIS — L578 Other skin changes due to chronic exposure to nonionizing radiation: Secondary | ICD-10-CM | POA: Diagnosis not present

## 2016-05-15 DIAGNOSIS — I788 Other diseases of capillaries: Secondary | ICD-10-CM | POA: Diagnosis not present

## 2016-05-15 DIAGNOSIS — D1800 Hemangioma unspecified site: Secondary | ICD-10-CM | POA: Diagnosis not present

## 2016-05-15 DIAGNOSIS — I8393 Asymptomatic varicose veins of bilateral lower extremities: Secondary | ICD-10-CM | POA: Diagnosis not present

## 2016-05-15 DIAGNOSIS — L720 Epidermal cyst: Secondary | ICD-10-CM | POA: Diagnosis not present

## 2016-05-15 DIAGNOSIS — L821 Other seborrheic keratosis: Secondary | ICD-10-CM | POA: Diagnosis not present

## 2016-05-15 DIAGNOSIS — D692 Other nonthrombocytopenic purpura: Secondary | ICD-10-CM | POA: Diagnosis not present

## 2016-05-15 DIAGNOSIS — L814 Other melanin hyperpigmentation: Secondary | ICD-10-CM | POA: Diagnosis not present

## 2016-05-15 DIAGNOSIS — Z1283 Encounter for screening for malignant neoplasm of skin: Secondary | ICD-10-CM | POA: Diagnosis not present

## 2016-05-15 DIAGNOSIS — B351 Tinea unguium: Secondary | ICD-10-CM | POA: Diagnosis not present

## 2016-05-15 DIAGNOSIS — D229 Melanocytic nevi, unspecified: Secondary | ICD-10-CM | POA: Diagnosis not present

## 2016-05-15 DIAGNOSIS — R21 Rash and other nonspecific skin eruption: Secondary | ICD-10-CM | POA: Diagnosis not present

## 2016-05-21 DIAGNOSIS — H43813 Vitreous degeneration, bilateral: Secondary | ICD-10-CM | POA: Diagnosis not present

## 2016-08-30 DIAGNOSIS — I5022 Chronic systolic (congestive) heart failure: Secondary | ICD-10-CM | POA: Diagnosis not present

## 2016-08-30 DIAGNOSIS — I10 Essential (primary) hypertension: Secondary | ICD-10-CM | POA: Diagnosis not present

## 2016-08-30 DIAGNOSIS — E782 Mixed hyperlipidemia: Secondary | ICD-10-CM | POA: Diagnosis not present

## 2016-08-30 DIAGNOSIS — R072 Precordial pain: Secondary | ICD-10-CM | POA: Diagnosis not present

## 2016-09-25 DIAGNOSIS — R072 Precordial pain: Secondary | ICD-10-CM | POA: Diagnosis not present

## 2016-09-25 DIAGNOSIS — I5022 Chronic systolic (congestive) heart failure: Secondary | ICD-10-CM | POA: Diagnosis not present

## 2016-10-01 DIAGNOSIS — G4730 Sleep apnea, unspecified: Secondary | ICD-10-CM | POA: Diagnosis not present

## 2016-10-01 DIAGNOSIS — I5022 Chronic systolic (congestive) heart failure: Secondary | ICD-10-CM | POA: Diagnosis not present

## 2016-10-01 DIAGNOSIS — I10 Essential (primary) hypertension: Secondary | ICD-10-CM | POA: Diagnosis not present

## 2016-10-01 DIAGNOSIS — E782 Mixed hyperlipidemia: Secondary | ICD-10-CM | POA: Diagnosis not present

## 2016-12-26 DIAGNOSIS — L578 Other skin changes due to chronic exposure to nonionizing radiation: Secondary | ICD-10-CM | POA: Diagnosis not present

## 2016-12-26 DIAGNOSIS — L570 Actinic keratosis: Secondary | ICD-10-CM | POA: Diagnosis not present

## 2017-01-06 ENCOUNTER — Encounter: Payer: Self-pay | Admitting: Physician Assistant

## 2017-01-06 ENCOUNTER — Ambulatory Visit (INDEPENDENT_AMBULATORY_CARE_PROVIDER_SITE_OTHER): Payer: PPO | Admitting: Physician Assistant

## 2017-01-06 VITALS — BP 130/62 | HR 60 | Temp 98.1°F | Resp 16 | Ht 66.0 in | Wt 140.8 lb

## 2017-01-06 DIAGNOSIS — M62830 Muscle spasm of back: Secondary | ICD-10-CM

## 2017-01-06 DIAGNOSIS — S39012A Strain of muscle, fascia and tendon of lower back, initial encounter: Secondary | ICD-10-CM | POA: Diagnosis not present

## 2017-01-06 MED ORDER — PREDNISONE 20 MG PO TABS: ORAL_TABLET | ORAL | 0 refills | Status: DC

## 2017-01-06 MED ORDER — CYCLOBENZAPRINE HCL 5 MG PO TABS: 5.0000 mg | ORAL_TABLET | Freq: Every day | ORAL | 0 refills | Status: DC

## 2017-01-06 NOTE — Progress Notes (Signed)
Patient: Dawn Roberts Female    DOB: 1945-12-30   71 y.o.   MRN: [id number] Visit Date: 01/06/2017  Today's Provider: Mar Daring, PA-C   Chief Complaint  Patient presents with  . Spasms   Subjective:    HPI Patient here today C/O of lower back pain on left side x's months, patient reports that she was having pain first thing in the morning and during the night. Patient reports that her pain would usually improve with activity but today it has not improved. Patient reports she took 3 tablets of Advil, reports no improvement. Patient denies any injury. She did do a lot of yard work on Saturday. Sunday she noticed her back was tight but not painful so she did her normal activities. Then overnight she developed what she describes as a spasm in the left lower back that has not subsided. Patient reports that a few years ago she was told by chiropractic that she has scoliosis in lower back.    No Known Allergies   Current Outpatient Prescriptions:  .  aspirin EC 81 MG tablet, Take 1 tablet by mouth daily., Disp: , Rfl:  .  Calcium-Vitamin D 600-200 MG-UNIT per tablet, Take 1 tablet by mouth 2 (two) times daily. , Disp: , Rfl:  .  carvedilol (COREG) 6.25 MG tablet, Take 6.25 mg by mouth 2 (two) times daily., Disp: , Rfl: 5 .  cyanocobalamin 1000 MCG tablet, Take 1,000 mcg by mouth daily. , Disp: , Rfl:  .  losartan (COZAAR) 50 MG tablet, Take 50 mg by mouth daily. , Disp: , Rfl:  .  Multiple Vitamin (MULTI-VITAMINS) TABS, Take 1 tablet by mouth daily. , Disp: , Rfl:  .  spironolactone (ALDACTONE) 25 MG tablet, Take 25 mg by mouth daily., Disp: , Rfl: 5 .  tretinoin (RETIN-A) 0.1 % cream, APPLY 1 APPLICATION ON THE SKIN AT BEDTIME, Disp: , Rfl: 6  Review of Systems  Constitutional: Negative.   Respiratory: Negative.   Cardiovascular: Negative.   Gastrointestinal: Negative.   Musculoskeletal: Positive for back pain and myalgias. Negative for gait problem.  Neurological:  Negative for dizziness, weakness, light-headedness and numbness.    Social History  Substance Use Topics  . Smoking status: Never Smoker  . Smokeless tobacco: Never Used  . Alcohol use No   Objective:   BP 130/62 (BP Location: Right Arm, Patient Position: Sitting, Cuff Size: Large)   Pulse 60   Temp 98.1 F (36.7 C) (Oral)   Resp 16   Ht 5\' 6"  (1.676 m)   Wt 140 lb 12.8 oz (63.9 kg)   SpO2 98%   BMI 22.73 kg/m  Vitals:   01/06/17 1554  BP: 130/62  Pulse: 60  Resp: 16  Temp: 98.1 F (36.7 C)  TempSrc: Oral  SpO2: 98%  Weight: 140 lb 12.8 oz (63.9 kg)  Height: 5\' 6"  (1.676 m)     Physical Exam  Constitutional: She appears well-developed and well-nourished. No distress.  Neck: Normal range of motion. Neck supple.  Cardiovascular: Normal rate, regular rhythm and normal heart sounds.  Exam reveals no gallop and no friction rub.   No murmur heard. Pulmonary/Chest: Effort normal and breath sounds normal. No respiratory distress. She has no wheezes. She has no rales.  Musculoskeletal:       Thoracic back: Normal.       Lumbar back: She exhibits decreased range of motion (forward flexion and lateral flexion bother her the most),  tenderness (left paraspinal muscles) and spasm (left sided). She exhibits no bony tenderness and no swelling.  Skin: She is not diaphoretic.  Vitals reviewed.     Assessment & Plan:     1. Muscle spasm of back Worsening. Prednisone and flexeril given as below. She may apply moist heat to the area. Epsom salt soaks can help as well. She is to call if symptoms worsen or fail to improve.  - predniSONE (DELTASONE) 20 MG tablet; Taper as follows: 3 pills for 4 days, two pills for 4 days, one pill for four days  Dispense: 24 tablet; Refill: 0 - cyclobenzaprine (FLEXERIL) 5 MG tablet; Take 1 tablet (5 mg total) by mouth at bedtime.  Dispense: 15 tablet; Refill: 0  2. Strain of lumbar region, initial encounter See above medical treatment plan. -  predniSONE (DELTASONE) 20 MG tablet; Taper as follows: 3 pills for 4 days, two pills for 4 days, one pill for four days  Dispense: 24 tablet; Refill: 0 - cyclobenzaprine (FLEXERIL) 5 MG tablet; Take 1 tablet (5 mg total) by mouth at bedtime.  Dispense: 15 tablet; Refill: 0       Mar Daring, PA-C  [facility]

## 2017-01-06 NOTE — Patient Instructions (Signed)
Low Back Strain A strain is a stretch or tear in a muscle or the strong cords of tissue that attach muscle to bone (tendons). Strains of the lower back (lumbar spine) are a common cause of low back pain. A strain occurs when muscles or tendons are torn or are stretched beyond their limits. The muscles may become inflamed, resulting in pain and sudden muscle tightening (spasms). A strain can happen suddenly due to an injury (trauma), or it can develop gradually due to overuse. There are three types of strains:  Grade 1 is a mild strain involving a minor tear of the muscle fibers or tendons. This may cause some pain but no loss of muscle strength.  Grade 2 is a moderate strain involving a partial tear of the muscle fibers or tendons. This causes more severe pain and some loss of muscle strength.  Grade 3 is a severe strain involving a complete tear of the muscle or tendon. This causes severe pain and complete or nearly complete loss of muscle strength. What are the causes? This condition may be caused by:  Trauma, such as a fall or a hit to the body.  Twisting or overstretching the back. This may result from doing activities that require a lot of energy, such as lifting heavy objects. What increases the risk? The following factors may increase your risk of getting this condition:  Playing contact sports.  Participating in sports or activities that put excessive stress on the back and require a lot of bending and twisting, including:  Lifting weights or heavy objects.  Gymnastics.  Soccer.  Figure skating.  Snowboarding.  Being overweight or obese.  Having poor strength and flexibility. What are the signs or symptoms? Symptoms of this condition may include:  Sharp or dull pain in the lower back that does not go away. Pain may extend to the buttocks.  Stiffness.  Limited range of motion.  Inability to stand up straight due to stiffness or pain.  Muscle spasms. How is this  diagnosed?   This condition may be diagnosed based on:  Your symptoms.  Your medical history.  A physical exam.  Your health care provider may push on certain areas of your back to determine the source of your pain.  You may be asked to bend forward, backward, and side to side to assess the severity of your pain and your range of motion.  Imaging tests, such as:  X-rays.  MRI. How is this treated? Treatment for this condition may include:  Applying heat and cold to the affected area.  Medicines to help relieve pain and to relax your muscles (muscle relaxants).  NSAIDs to help reduce swelling and discomfort.  Physical therapy. When your symptoms improve, it is important to gradually return to your normal routine as soon as possible to reduce pain, avoid stiffness, and avoid loss of muscle strength. Generally, symptoms should improve within 6 weeks of treatment. However, recovery time varies. Follow these instructions at home: Managing pain, stiffness, and swelling  If directed, apply ice to the injured area during the first 24 hours after your injury.  Put ice in a plastic bag.  Place a towel between your skin and the bag.  Leave the ice on for 20 minutes, 2-3 times a day.  If directed, apply heat to the affected area as often as told by your health care provider. Use the heat source that your health care provider recommends, such as a moist heat pack or a heating pad.    Place a towel between your skin and the heat source.  Leave the heat on for 20-30 minutes.  Remove the heat if your skin turns bright red. This is especially important if you are unable to feel pain, heat, or cold. You may have a greater risk of getting burned. Activity   Rest and return to your normal activities as told by your health care provider. Ask your health care provider what activities are safe for you.  Avoid activities that take a lot of effort (are strenuous) for as long as told by your  health care provider.  Do exercises as told by your health care provider. General instructions    Take over-the-counter and prescription medicines only as told by your health care provider.  If you have questions or concerns about safety while taking pain medicine, talk with your health care provider.  Do not drive or operate heavy machinery until you know how your pain medicine affects you.  Do not use any tobacco products, such as cigarettes, chewing tobacco, and e-cigarettes. Tobacco can delay bone healing. If you need help quitting, ask your health care provider.  Keep all follow-up visits as told by your health care provider. This is important. How is this prevented?  Warm up and stretch before being active.  Cool down and stretch after being active.  Give your body time to rest between periods of activity.  Avoid:  Being physically inactive for long periods at a time.  Exercising or playing sports when you are tired or in pain.  Use correct form when playing sports and lifting heavy objects.  Use good posture when sitting and standing.  Maintain a healthy weight.  Sleep on a mattress with medium firmness to support your back.  Make sure to use equipment that fits you, including shoes that fit well.  Be safe and responsible while being active to avoid falls.  Do at least 150 minutes of moderate-intensity exercise each week, such as brisk walking or water aerobics. Try a form of exercise that takes stress off your back, such as swimming or stationary cycling.  Maintain physical fitness, including:  Strength.  Flexibility.  Cardiovascular fitness.  Endurance. Contact a health care provider if:  Your back pain does not improve after 6 weeks of treatment.  Your symptoms get worse. Get help right away if:  Your back pain is severe.  You are unable to stand or walk.  You develop pain in your legs.  You develop weakness in your buttocks or legs.  You  have difficulty controlling when you urinate or when you have a bowel movement. This information is not intended to replace advice given to you by your health care provider. Make sure you discuss any questions you have with your health care provider. Document Released: 09/09/2005 Document Revised: 05/16/2016 Document Reviewed: 06/21/2015 Elsevier Interactive Patient Education  2017 [place].   Back Exercises If you have pain in your back, do these exercises 2-3 times each day or as told by your doctor. When the pain goes away, do the exercises once each day, but repeat the steps more times for each exercise (do more repetitions). If you do not have pain in your back, do these exercises once each day or as told by your doctor. Exercises Single Knee to Chest   Do these steps 3-5 times in a row for each leg: 1. Lie on your back on a firm bed or the floor with your legs stretched out. 2. Bring one  knee to your chest. 3. Hold your knee to your chest by grabbing your knee or thigh. 4. Pull on your knee until you feel a gentle stretch in your lower back. 5. Keep doing the stretch for 10-30 seconds. 6. Slowly let go of your leg and straighten it. Pelvic Tilt   Do these steps 5-10 times in a row: 1. Lie on your back on a firm bed or the floor with your legs stretched out. 2. Bend your knees so they point up to the ceiling. Your feet should be flat on the floor. 3. Tighten your lower belly (abdomen) muscles to press your lower back against the floor. This will make your tailbone point up to the ceiling instead of pointing down to your feet or the floor. 4. Stay in this position for 5-10 seconds while you gently tighten your muscles and breathe evenly. Cat-Cow   Do these steps until your lower back bends more easily: 1. Get on your hands and knees on a firm surface. Keep your hands under your shoulders, and keep your knees under your hips. You may put padding under your knees. 2. Let your head  hang down, and make your tailbone point down to the floor so your lower back is round like the back of a cat. 3. Stay in this position for 5 seconds. 4. Slowly lift your head and make your tailbone point up to the ceiling so your back hangs low (sags) like the back of a cow. 5. Stay in this position for 5 seconds. Press-Ups   Do these steps 5-10 times in a row: 1. Lie on your belly (face-down) on the floor. 2. Place your hands near your head, about shoulder-width apart. 3. While you keep your back relaxed and keep your hips on the floor, slowly straighten your arms to raise the top half of your body and lift your shoulders. Do not use your back muscles. To make yourself more comfortable, you may change where you place your hands. 4. Stay in this position for 5 seconds. 5. Slowly return to lying flat on the floor. Bridges   Do these steps 10 times in a row: 1. Lie on your back on a firm surface. 2. Bend your knees so they point up to the ceiling. Your feet should be flat on the floor. 3. Tighten your butt muscles and lift your butt off of the floor until your waist is almost as high as your knees. If you do not feel the muscles working in your butt and the back of your thighs, slide your feet 1-2 inches farther away from your butt. 4. Stay in this position for 3-5 seconds. 5. Slowly lower your butt to the floor, and let your butt muscles relax. If this exercise is too easy, try doing it with your arms crossed over your chest. Belly Crunches   Do these steps 5-10 times in a row: 1. Lie on your back on a firm bed or the floor with your legs stretched out. 2. Bend your knees so they point up to the ceiling. Your feet should be flat on the floor. 3. Cross your arms over your chest. 4. Tip your chin a little bit toward your chest but do not bend your neck. 5. Tighten your belly muscles and slowly raise your chest just enough to lift your shoulder blades a tiny bit off of the floor. 6. Slowly  lower your chest and your head to the floor. Back Lifts  Do these steps  5-10 times in a row: 1. Lie on your belly (face-down) with your arms at your sides, and rest your forehead on the floor. 2. Tighten the muscles in your legs and your butt. 3. Slowly lift your chest off of the floor while you keep your hips on the floor. Keep the back of your head in line with the curve in your back. Look at the floor while you do this. 4. Stay in this position for 3-5 seconds. 5. Slowly lower your chest and your face to the floor. Contact a doctor if:  Your back pain gets a lot worse when you do an exercise.  Your back pain does not lessen 2 hours after you exercise. If you have any of these problems, stop doing the exercises. Do not do them again unless your doctor says it is okay. Get help right away if:  You have sudden, very bad back pain. If this happens, stop doing the exercises. Do not do them again unless your doctor says it is okay. This information is not intended to replace advice given to you by your health care provider. Make sure you discuss any questions you have with your health care provider. Document Released: 10/12/2010 Document Revised: 02/15/2016 Document Reviewed: 11/03/2014 Elsevier Interactive Patient Education  2017 [employer].

## 2017-01-08 ENCOUNTER — Other Ambulatory Visit: Payer: Self-pay | Admitting: Physician Assistant

## 2017-01-08 DIAGNOSIS — Z1231 Encounter for screening mammogram for malignant neoplasm of breast: Secondary | ICD-10-CM

## 2017-01-28 ENCOUNTER — Ambulatory Visit (INDEPENDENT_AMBULATORY_CARE_PROVIDER_SITE_OTHER): Payer: PPO | Admitting: Physician Assistant

## 2017-01-28 ENCOUNTER — Encounter: Payer: Self-pay | Admitting: Physician Assistant

## 2017-01-28 VITALS — BP 122/66 | HR 64 | Temp 97.5°F | Resp 16 | Ht 64.5 in | Wt 140.0 lb

## 2017-01-28 DIAGNOSIS — E119 Type 2 diabetes mellitus without complications: Secondary | ICD-10-CM

## 2017-01-28 DIAGNOSIS — E782 Mixed hyperlipidemia: Secondary | ICD-10-CM

## 2017-01-28 DIAGNOSIS — I10 Essential (primary) hypertension: Secondary | ICD-10-CM

## 2017-01-28 DIAGNOSIS — Z0000 Encounter for general adult medical examination without abnormal findings: Secondary | ICD-10-CM | POA: Diagnosis not present

## 2017-01-28 LAB — POCT UA - MICROALBUMIN: MICROALBUMIN (UR) POC: 20 mg/L

## 2017-01-28 NOTE — Progress Notes (Signed)
Patient: Dawn Roberts, Female    DOB: November 02, 1945, 71 y.o.   MRN: [id number] Visit Date: 01/28/2017  Today's Provider: Mar Daring, PA-C   Chief Complaint  Patient presents with  . Medicare Wellness  . Diabetes   Subjective:    Subsequent Medicare Wellness Visit Dawn Roberts is a 71 y.o. female who presents today for health maintenance and complete physical. She feels well. She reports exercising 4 days a week; pt walks 2 times a week and goes to [employer] 2 times a week.. She reports she is sleeping well.  Last AWE: 01/24/16 Mammogram;01/30/13-BI-RADS 1-Has mammogram scheduled for 02/18/17; Patient;s sister just had a biopsy of a lump yesterday, awaiting pathology. Last pap:09/28/12 Negative, HPV-Negative Last Colonoscopy; 02/01/15 Dr.Sankar. Diverticulosis otherwise WNL Last BMD:12/21/14-WNL. -----------------------------------------------------------------  Diabetes Mellitus Type II, Follow-up:   Lab Results  Component Value Date   HGBA1C 6.3 (H) 01/24/2016   HGBA1C 6.4 10/24/2015   HGBA1C 6.2 06/12/2015    Last seen for diabetes 1 years ago.  Management since then includes none. She reports excellent compliance with treatment. She is not having side effects.  Current symptoms include none and have been stable. Home blood sugar records: not being checked  Episodes of hypoglycemia? no   Current Insulin Regimen: N/A Most Recent Eye Exam: last year Weight trend: increasing steadily Prior visit with dietician: no Current diet: in general, a "healthy" diet   Current exercise: walking and [employer] classes  Pertinent Labs:    Component Value Date/Time   CHOL 144 01/24/2016 1014   TRIG 41 01/24/2016 1014   HDL 51 01/24/2016 1014   LDLCALC 85 01/24/2016 1014   CREATININE 0.85 01/24/2016 1014    Wt Readings from Last 3 Encounters:  01/28/17 140 lb (63.5 kg)  01/06/17 140 lb 12.8 oz (63.9 kg)  03/27/16 126 lb (57.2 kg)     ------------------------------------------------------------------------   Review of Systems  Constitutional: Negative.   HENT: Negative.   Eyes: Negative.   Respiratory: Positive for cough. Negative for apnea, choking, chest tightness, shortness of breath, wheezing and stridor.   Cardiovascular: Negative.   Gastrointestinal: Negative.   Endocrine: Negative.   Genitourinary: Negative.   Musculoskeletal: Positive for back pain (still present from strain 01/06/2017). Negative for arthralgias, gait problem, joint swelling, myalgias, neck pain and neck stiffness.  Skin: Negative.   Allergic/Immunologic: Negative.   Neurological: Negative.   Hematological: Negative.   Psychiatric/Behavioral: Negative.     Social History      She  reports that she has never smoked. She has never used smokeless tobacco. She reports that she does not drink alcohol or use drugs.       Social History   Social History  . Marital status: Divorced    Spouse name: N/A  . Number of children: 4  . Years of education: HS   Occupational History  . retired    Social History Main Topics  . Smoking status: Never Smoker  . Smokeless tobacco: Never Used  . Alcohol use No  . Drug use: No  . Sexual activity: Yes    Birth control/ protection: Post-menopausal     Comment: pt states, "I have a friend"   Other Topics Concern  . None   Social History Narrative  . None    Past Medical History:  Diagnosis Date  . Allergy   . Anemia   . CHF (congestive heart failure) ([place])   . Hyperlipidemia   . Hypertension  Patient Active Problem List   Diagnosis Date Noted  . Diabetes mellitus with no complication ([place]) 09/32/6712  . Bradycardia 03/07/2015  . Abnormal blood sugar 01/18/2015  . Allergic rhinitis 01/18/2015  . Body mass index (BMI) of 23.0-23.9 in adult 01/18/2015  . Abnormal liver enzymes 01/18/2015  . Chronic systolic heart failure ([place]) 09/06/2014  . Cardiomyopathy ([place]) 05/02/2014   . Essential (primary) hypertension 05/02/2014  . Apnea, sleep 05/02/2014  . Combined fat and carbohydrate induced hyperlipemia 05/02/2014  . Absolute anemia 04/15/2008  . Cannot sleep 06/12/2007  . Abnormal ECG 02/19/2007    Past Surgical History:  Procedure Laterality Date  . BREAST BIOPSY Right 1976  . BREAST MASS EXCISION Right 1976   benign  . COLONOSCOPY  03-29-05   Dr [name]  . COLONOSCOPY N/A 02/01/2015   Procedure: COLONOSCOPY;  Surgeon: [name], MD;  Location: [facility] ENDOSCOPY;  Service: Endoscopy;  Laterality: N/A;  . venous closure procedure Right 07-25-05   Dr. [name]    Family History        Family Status  Relation Status  . Father Deceased at age 61  . Sister Deceased at age 88   ovarian cancer  . Mother Deceased at age 74  . Sister Alive  . Brother Alive  . Sister Alive  . Maternal Aunt         Her family history includes Breast cancer in her maternal aunt; Diabetes in her father; Healthy in her brother, sister, and sister; Ovarian cancer in her sister.     No Known Allergies   Current Outpatient Prescriptions:  .  aspirin EC 81 MG tablet, Take 1 tablet by mouth daily., Disp: , Rfl:  .  Calcium-Vitamin D 600-200 MG-UNIT per tablet, Take 1 tablet by mouth 2 (two) times daily. , Disp: , Rfl:  .  carvedilol (COREG) 6.25 MG tablet, Take 6.25 mg by mouth 2 (two) times daily., Disp: , Rfl: 5 .  cyanocobalamin 1000 MCG tablet, Take 1,000 mcg by mouth daily. , Disp: , Rfl:  .  losartan (COZAAR) 50 MG tablet, Take 50 mg by mouth daily. , Disp: , Rfl:  .  Multiple Vitamin (MULTI-VITAMINS) TABS, Take 1 tablet by mouth daily. , Disp: , Rfl:  .  spironolactone (ALDACTONE) 25 MG tablet, Take 25 mg by mouth daily., Disp: , Rfl: 5 .  tretinoin (RETIN-A) 0.1 % cream, APPLY 1 APPLICATION ON THE SKIN AT BEDTIME, Disp: , Rfl: 6   Patient Care Team: Mar Daring, PA-C as PCP - General (Family Medicine) [name], MD (General  Surgery) Margarita Rana, MD as Referring Physician (Family Medicine)      Objective:   Vitals: BP 122/66 (BP Location: Left Arm, Patient Position: Sitting, Cuff Size: Normal)   Pulse 64   Temp 97.5 F (36.4 C) (Oral)   Resp 16   Ht 5' 4.5" (1.638 m)   Wt 140 lb (63.5 kg)   BMI 23.66 kg/m     Physical Exam  Constitutional: She is oriented to person, place, and time. She appears well-developed and well-nourished. No distress.  HENT:  Head: Normocephalic and atraumatic.  Right Ear: Hearing, tympanic membrane, external ear and ear canal normal.  Left Ear: Hearing, tympanic membrane, external ear and ear canal normal.  Nose: Nose normal.  Mouth/Throat: Uvula is midline, oropharynx is clear and moist and mucous membranes are normal. Dental caries present. No oropharyngeal exudate.  Eyes: Conjunctivae and EOM are normal. Pupils are equal, round, and reactive  to light. Right eye exhibits no discharge. Left eye exhibits no discharge. No scleral icterus.  Neck: Normal range of motion. Neck supple. No JVD present. Carotid bruit is not present. No tracheal deviation present. No thyromegaly present.  Cardiovascular: Normal rate, regular rhythm, normal heart sounds and intact distal pulses.  Exam reveals no gallop and no friction rub.   No murmur heard. Pulmonary/Chest: Effort normal and breath sounds normal. No respiratory distress. She has no wheezes. She has no rales. She exhibits no tenderness. Right breast exhibits no inverted nipple, no mass, no nipple discharge, no skin change and no tenderness. Left breast exhibits no inverted nipple, no mass, no nipple discharge, no skin change and no tenderness. Breasts are symmetrical.  Abdominal: Soft. Bowel sounds are normal. She exhibits no distension and no mass. There is no tenderness. There is no rebound and no guarding.  Musculoskeletal: Normal range of motion. She exhibits no edema or tenderness.  Lymphadenopathy:    She has no cervical  adenopathy.  Neurological: She is alert and oriented to person, place, and time.  Skin: Skin is warm and dry. No rash noted. She is not diaphoretic.     Psychiatric: She has a normal mood and affect. Her behavior is normal. Judgment and thought content normal.  Vitals reviewed.  Diabetic Foot Exam - Simple   Simple Foot Form Diabetic Foot exam was performed with the following findings:  Yes 01/28/2017 11:40 AM  Visual Inspection No deformities, no ulcerations, no other skin breakdown bilaterally:  Yes See comments:  Yes Sensation Testing Intact to touch and monofilament testing bilaterally:  Yes Pulse Check Posterior Tibialis and Dorsalis pulse intact bilaterally:  Yes Comments Patient did have a bruise on the top of the left foot from where she dropped a water bucket on her foot while she was watering her flowers.     Depression Screen PHQ 2/9 Scores 01/28/2017 01/24/2016 03/06/2015  PHQ - 2 Score 0 0 0  PHQ- 9 Score 0 - -   Cognitive Testing - 6-CIT  Correct? Score   What year is it? yes 0 0 or 4  What month is it? yes 0 0 or 3  Memorize:    Pia Mau,  41,  [place],      What time is it? (within 1 hour) yes 0 0 or 3  Count backwards from 20 yes 0 0, 2, or 4  Name the months of the year yes 0 0, 2, or 4  Repeat name & address above no 2 0, 2, 4, 6, 8, or 10       TOTAL SCORE  2/28   Interpretation:  Normal  Normal (0-7) Abnormal (8-28)   Audit-C Alcohol Use Screening  Question Answer Points  How often do you have alcoholic drink? never 0  On days you do drink alcohol, how many drinks do you typically consume? Never 0  How oftey will you drink 6 or more in a total? never 0  Total Score:  0   A score of 3 or more in women, and 4 or more in men indicates increased risk for alcohol abuse, EXCEPT if all of the points are from question 1.  Fall Risk  01/28/2017 01/24/2016 03/06/2015  Falls in the past year? No No No    Assessment & Plan:     Routine Health  Maintenance and Physical Exam  Exercise Activities and Dietary recommendations Goals    . HEMOGLOBIN A1C < 7.0  Immunization History  Administered Date(s) Administered  . Influenza, High Dose Seasonal PF 06/12/2015  . Pneumococcal Conjugate-13 12/07/2014  . Pneumococcal Polysaccharide-23 09/26/2011  . Tdap 02/10/2006    Health Maintenance  Topic Date Due  . OPHTHALMOLOGY EXAM  07/26/1955  . TETANUS/TDAP  02/11/2016  . FOOT EXAM  06/11/2016  . HEMOGLOBIN A1C  07/26/2016  . INFLUENZA VACCINE  04/23/2017  . MAMMOGRAM  01/30/2018  . COLONOSCOPY  01/31/2025  . DEXA SCAN  Completed  . Hepatitis C Screening  Completed  . PNA vac Low Risk Adult  Completed     Discussed health benefits of physical activity, and encouraged her to engage in regular exercise appropriate for her age and condition.    1. Medicare annual wellness visit, subsequent Normal physical exam for age. Patient declined Td vaccine.   2. Diabetes mellitus with no complication (HCC) UA microalbumin normal and unchanged. Foot exam normal. Advised to call for ophthalmology exam. Will check labs as below and f/u pending results. Continue healthy diet and lifestyle modifications.  - POCT UA - Microalbumin - CBC w/Diff/Platelet - Comprehensive Metabolic Panel (CMET) - HgB A1c - Lipid Profile  3. Essential (primary) hypertension Stable. Continue carvedilol 6.25mg , losartan 50mg , and spironolactone 25mg . Will check labs as below and f/u pending results. - CBC w/Diff/Platelet - Comprehensive Metabolic Panel (CMET) - HgB A1c - Lipid Profile  4. Combined fat and carbohydrate induced hyperlipemia Diet controlled. Will check labs as below and f/u pending results. - CBC w/Diff/Platelet - Comprehensive Metabolic Panel (CMET) - HgB A1c - Lipid Profile  --------------------------------------------------------------------    Mar Daring, PA-C  [facility]

## 2017-01-28 NOTE — Patient Instructions (Signed)
Health Maintenance for Postmenopausal Women Menopause is a normal process in which your reproductive ability comes to an end. This process happens gradually over a span of months to years, usually between the ages of 33 and 38. Menopause is complete when you have missed 12 consecutive menstrual periods. It is important to talk with your health care provider about some of the most common conditions that affect postmenopausal women, such as heart disease, cancer, and bone loss (osteoporosis). Adopting a healthy lifestyle and getting preventive care can help to promote your health and wellness. Those actions can also lower your chances of developing some of these common conditions. What should I know about menopause? During menopause, you may experience a number of symptoms, such as:  Moderate-to-severe hot flashes.  Night sweats.  Decrease in sex drive.  Mood swings.  Headaches.  Tiredness.  Irritability.  Memory problems.  Insomnia. Choosing to treat or not to treat menopausal changes is an individual decision that you make with your health care provider. What should I know about hormone replacement therapy and supplements? Hormone therapy products are effective for treating symptoms that are associated with menopause, such as hot flashes and night sweats. Hormone replacement carries certain risks, especially as you become older. If you are thinking about using estrogen or estrogen with progestin treatments, discuss the benefits and risks with your health care provider. What should I know about heart disease and stroke? Heart disease, heart attack, and stroke become more likely as you age. This may be due, in part, to the hormonal changes that your body experiences during menopause. These can affect how your body processes dietary fats, triglycerides, and cholesterol. Heart attack and stroke are both medical emergencies. There are many things that you can do to help prevent heart disease  and stroke:  Have your blood pressure checked at least every 1-2 years. High blood pressure causes heart disease and increases the risk of stroke.  If you are 48-61 years old, ask your health care provider if you should take aspirin to prevent a heart attack or a stroke.  Do not use any tobacco products, including cigarettes, chewing tobacco, or electronic cigarettes. If you need help quitting, ask your health care provider.  It is important to eat a healthy diet and maintain a healthy weight.  Be sure to include plenty of vegetables, fruits, low-fat dairy products, and lean protein.  Avoid eating foods that are high in solid fats, added sugars, or salt (sodium).  Get regular exercise. This is one of the most important things that you can do for your health.  Try to exercise for at least 150 minutes each week. The type of exercise that you do should increase your heart rate and make you sweat. This is known as moderate-intensity exercise.  Try to do strengthening exercises at least twice each week. Do these in addition to the moderate-intensity exercise.  Know your numbers.Ask your health care provider to check your cholesterol and your blood glucose. Continue to have your blood tested as directed by your health care provider. What should I know about cancer screening? There are several types of cancer. Take the following steps to reduce your risk and to catch any cancer development as early as possible. Breast Cancer  Practice breast self-awareness.  This means understanding how your breasts normally appear and feel.  It also means doing regular breast self-exams. Let your health care provider know about any changes, no matter how small.  If you are 40 or older,  have a clinician do a breast exam (clinical breast exam or CBE) every year. Depending on your age, family history, and medical history, it may be recommended that you also have a yearly breast X-ray (mammogram).  If you  have a family history of breast cancer, talk with your health care provider about genetic screening.  If you are at high risk for breast cancer, talk with your health care provider about having an MRI and a mammogram every year.  Breast cancer (BRCA) gene test is recommended for women who have family members with BRCA-related cancers. Results of the assessment will determine the need for genetic counseling and BRCA1 and for BRCA2 testing. BRCA-related cancers include these types:  Breast. This occurs in males or females.  Ovarian.  Tubal. This may also be called fallopian tube cancer.  Cancer of the abdominal or pelvic lining (peritoneal cancer).  Prostate.  Pancreatic. Cervical, Uterine, and Ovarian Cancer  Your health care provider may recommend that you be screened regularly for cancer of the pelvic organs. These include your ovaries, uterus, and vagina. This screening involves a pelvic exam, which includes checking for microscopic changes to the surface of your cervix (Pap test).  For women ages 21-65, health care providers may recommend a pelvic exam and a Pap test every three years. For women ages 23-65, they may recommend the Pap test and pelvic exam, combined with testing for human papilloma virus (HPV), every five years. Some types of HPV increase your risk of cervical cancer. Testing for HPV may also be done on women of any age who have unclear Pap test results.  Other health care providers may not recommend any screening for nonpregnant women who are considered low risk for pelvic cancer and have no symptoms. Ask your health care provider if a screening pelvic exam is right for you.  If you have had past treatment for cervical cancer or a condition that could lead to cancer, you need Pap tests and screening for cancer for at least 20 years after your treatment. If Pap tests have been discontinued for you, your risk factors (such as having a new sexual partner) need to be reassessed  to determine if you should start having screenings again. Some women have medical problems that increase the chance of getting cervical cancer. In these cases, your health care provider may recommend that you have screening and Pap tests more often.  If you have a family history of uterine cancer or ovarian cancer, talk with your health care provider about genetic screening.  If you have vaginal bleeding after reaching menopause, tell your health care provider.  There are currently no reliable tests available to screen for ovarian cancer. Lung Cancer  Lung cancer screening is recommended for adults 99-83 years old who are at high risk for lung cancer because of a history of smoking. A yearly low-dose CT scan of the lungs is recommended if you:  Currently smoke.  Have a history of at least 30 pack-years of smoking and you currently smoke or have quit within the past 15 years. A pack-year is smoking an average of one pack of cigarettes per day for one year. Yearly screening should:  Continue until it has been 15 years since you quit.  Stop if you develop a health problem that would prevent you from having lung cancer treatment. Colorectal Cancer  This type of cancer can be detected and can often be prevented.  Routine colorectal cancer screening usually begins at age 72 and continues  through age 75.  If you have risk factors for colon cancer, your health care provider may recommend that you be screened at an earlier age.  If you have a family history of colorectal cancer, talk with your health care provider about genetic screening.  Your health care provider may also recommend using home test kits to check for hidden blood in your stool.  A small camera at the end of a tube can be used to examine your colon directly (sigmoidoscopy or colonoscopy). This is done to check for the earliest forms of colorectal cancer.  Direct examination of the colon should be repeated every 5-10 years until  age 75. However, if early forms of precancerous polyps or small growths are found or if you have a family history or genetic risk for colorectal cancer, you may need to be screened more often. Skin Cancer  Check your skin from head to toe regularly.  Monitor any moles. Be sure to tell your health care provider:  About any new moles or changes in moles, especially if there is a change in a mole's shape or color.  If you have a mole that is larger than the size of a pencil eraser.  If any of your family members has a history of skin cancer, especially at a young age, talk with your health care provider about genetic screening.  Always use sunscreen. Apply sunscreen liberally and repeatedly throughout the day.  Whenever you are outside, protect yourself by wearing long sleeves, pants, a wide-brimmed hat, and sunglasses. What should I know about osteoporosis? Osteoporosis is a condition in which bone destruction happens more quickly than new bone creation. After menopause, you may be at an increased risk for osteoporosis. To help prevent osteoporosis or the bone fractures that can happen because of osteoporosis, the following is recommended:  If you are 19-50 years old, get at least 1,000 mg of calcium and at least 600 mg of vitamin D per day.  If you are older than age 50 but younger than age 70, get at least 1,200 mg of calcium and at least 600 mg of vitamin D per day.  If you are older than age 70, get at least 1,200 mg of calcium and at least 800 mg of vitamin D per day. Smoking and excessive alcohol intake increase the risk of osteoporosis. Eat foods that are rich in calcium and vitamin D, and do weight-bearing exercises several times each week as directed by your health care provider. What should I know about how menopause affects my mental health? Depression may occur at any age, but it is more common as you become older. Common symptoms of depression include:  Low or sad  mood.  Changes in sleep patterns.  Changes in appetite or eating patterns.  Feeling an overall lack of motivation or enjoyment of activities that you previously enjoyed.  Frequent crying spells. Talk with your health care provider if you think that you are experiencing depression. What should I know about immunizations? It is important that you get and maintain your immunizations. These include:  Tetanus, diphtheria, and pertussis (Tdap) booster vaccine.  Influenza every year before the flu season begins.  Pneumonia vaccine.  Shingles vaccine. Your health care provider may also recommend other immunizations. This information is not intended to replace advice given to you by your health care provider. Make sure you discuss any questions you have with your health care provider. Document Released: 11/01/2005 Document Revised: 03/29/2016 Document Reviewed: 06/13/2015 Elsevier Interactive Patient   Education  2017 [employer]  

## 2017-01-29 ENCOUNTER — Telehealth: Payer: Self-pay

## 2017-01-29 LAB — LIPID PANEL
CHOL/HDL RATIO: 3 ratio (ref 0.0–4.4)
Cholesterol, Total: 161 mg/dL (ref 100–199)
HDL: 54 mg/dL (ref >39)
LDL CALC: 94 mg/dL (ref 0–99)
TRIGLYCERIDES: 65 mg/dL (ref 0–149)
VLDL Cholesterol Cal: 13 mg/dL (ref 5–40)

## 2017-01-29 LAB — CBC WITH DIFFERENTIAL/PLATELET
BASOS ABS: 0 10*3/uL (ref 0.0–0.2)
Basos: 1 %
EOS (ABSOLUTE): 0.1 10*3/uL (ref 0.0–0.4)
Eos: 2 %
HEMOGLOBIN: 11.7 g/dL (ref 11.1–15.9)
Hematocrit: 37.2 % (ref 34.0–46.6)
Immature Grans (Abs): 0 10*3/uL (ref 0.0–0.1)
Immature Granulocytes: 0 %
Lymphocytes Absolute: 2 10*3/uL (ref 0.7–3.1)
Lymphs: 39 %
MCH: 28.5 pg (ref 26.6–33.0)
MCHC: 31.5 g/dL (ref 31.5–35.7)
MCV: 91 fL (ref 79–97)
MONOCYTES: 6 %
Monocytes Absolute: 0.3 10*3/uL (ref 0.1–0.9)
NEUTROS PCT: 52 %
Neutrophils Absolute: 2.7 10*3/uL (ref 1.4–7.0)
Platelets: 227 10*3/uL (ref 150–379)
RBC: 4.1 x10E6/uL (ref 3.77–5.28)
RDW: 13.9 % (ref 12.3–15.4)
WBC: 5.2 10*3/uL (ref 3.4–10.8)

## 2017-01-29 LAB — COMPREHENSIVE METABOLIC PANEL
ALK PHOS: 33 IU/L — AB (ref 39–117)
ALT: 21 IU/L (ref 0–32)
AST: 19 IU/L (ref 0–40)
Albumin/Globulin Ratio: 2 (ref 1.2–2.2)
Albumin: 4.5 g/dL (ref 3.5–4.8)
BUN/Creatinine Ratio: 21 (ref 12–28)
BUN: 20 mg/dL (ref 8–27)
Bilirubin Total: 0.3 mg/dL (ref 0.0–1.2)
CO2: 28 mmol/L (ref 18–29)
Calcium: 9.7 mg/dL (ref 8.7–10.3)
Chloride: 101 mmol/L (ref 96–106)
Creatinine, Ser: 0.97 mg/dL (ref 0.57–1.00)
GFR calc Af Amer: 68 mL/min/{1.73_m2} (ref >59)
GFR calc non Af Amer: 59 mL/min/{1.73_m2} — ABNORMAL LOW (ref >59)
GLUCOSE: 107 mg/dL — AB (ref 65–99)
Globulin, Total: 2.3 g/dL (ref 1.5–4.5)
Potassium: 4.2 mmol/L (ref 3.5–5.2)
Sodium: 142 mmol/L (ref 134–144)
Total Protein: 6.8 g/dL (ref 6.0–8.5)

## 2017-01-29 LAB — HEMOGLOBIN A1C
ESTIMATED AVERAGE GLUCOSE: 134 mg/dL
HEMOGLOBIN A1C: 6.3 % — AB (ref 4.8–5.6)

## 2017-01-29 NOTE — Telephone Encounter (Signed)
[id number] 

## 2017-01-29 NOTE — Telephone Encounter (Signed)
Patient has been advised. KW 

## 2017-01-29 NOTE — Telephone Encounter (Signed)
-----   Message from Mar Daring, PA-C sent at 01/29/2017  8:11 AM EDT ----- Cholesterol is normal. A1c stable at 6.3. All other labs are WNL.

## 2017-02-18 ENCOUNTER — Ambulatory Visit
Admission: RE | Admit: 2017-02-18 | Discharge: 2017-02-18 | Disposition: A | Discharge status: Home / Self Care | Payer: PPO | Source: Ambulatory Visit | Attending: Physician Assistant | Admitting: Physician Assistant

## 2017-02-18 ENCOUNTER — Telehealth: Payer: Self-pay

## 2017-02-18 DIAGNOSIS — Z1231 Encounter for screening mammogram for malignant neoplasm of breast: Secondary | ICD-10-CM | POA: Diagnosis not present

## 2017-02-18 NOTE — Telephone Encounter (Signed)
-----   Message from Mar Daring, [place] sent at 02/18/2017  2:08 PM EDT ----- Normal mammogram. Repeat screening in one year.

## 2017-02-18 NOTE — Telephone Encounter (Signed)
Left message advising pt. Ok per DPR. Omar Orrego Drozdowski, CMA  

## 2017-03-14 DIAGNOSIS — L821 Other seborrheic keratosis: Secondary | ICD-10-CM | POA: Diagnosis not present

## 2017-03-14 DIAGNOSIS — L570 Actinic keratosis: Secondary | ICD-10-CM | POA: Diagnosis not present

## 2017-03-31 DIAGNOSIS — I10 Essential (primary) hypertension: Secondary | ICD-10-CM | POA: Diagnosis not present

## 2017-03-31 DIAGNOSIS — E782 Mixed hyperlipidemia: Secondary | ICD-10-CM | POA: Diagnosis not present

## 2017-03-31 DIAGNOSIS — G4730 Sleep apnea, unspecified: Secondary | ICD-10-CM | POA: Diagnosis not present

## 2017-03-31 DIAGNOSIS — I5022 Chronic systolic (congestive) heart failure: Secondary | ICD-10-CM | POA: Diagnosis not present

## 2017-03-31 DIAGNOSIS — I429 Cardiomyopathy, unspecified: Secondary | ICD-10-CM | POA: Diagnosis not present

## 2017-06-24 DIAGNOSIS — L720 Epidermal cyst: Secondary | ICD-10-CM | POA: Diagnosis not present

## 2017-06-24 DIAGNOSIS — Z1283 Encounter for screening for malignant neoplasm of skin: Secondary | ICD-10-CM | POA: Diagnosis not present

## 2017-06-24 DIAGNOSIS — L570 Actinic keratosis: Secondary | ICD-10-CM | POA: Diagnosis not present

## 2017-06-24 DIAGNOSIS — L821 Other seborrheic keratosis: Secondary | ICD-10-CM | POA: Diagnosis not present

## 2017-06-24 DIAGNOSIS — L814 Other melanin hyperpigmentation: Secondary | ICD-10-CM | POA: Diagnosis not present

## 2017-06-24 DIAGNOSIS — D1800 Hemangioma unspecified site: Secondary | ICD-10-CM | POA: Diagnosis not present

## 2017-06-24 DIAGNOSIS — D229 Melanocytic nevi, unspecified: Secondary | ICD-10-CM | POA: Diagnosis not present

## 2017-07-09 DIAGNOSIS — H2512 Age-related nuclear cataract, left eye: Secondary | ICD-10-CM | POA: Diagnosis not present

## 2017-07-22 DIAGNOSIS — H2512 Age-related nuclear cataract, left eye: Secondary | ICD-10-CM | POA: Diagnosis not present

## 2017-07-28 ENCOUNTER — Encounter: Payer: Self-pay | Admitting: *Deleted

## 2017-07-29 ENCOUNTER — Encounter
Admission: RE | Disposition: A | Discharge status: Home / Self Care | Payer: Self-pay | Source: Ambulatory Visit | Attending: Ophthalmology

## 2017-07-29 ENCOUNTER — Ambulatory Visit: Payer: PPO | Admitting: Certified Registered"

## 2017-07-29 ENCOUNTER — Encounter: Payer: Self-pay | Admitting: *Deleted

## 2017-07-29 ENCOUNTER — Ambulatory Visit
Admission: RE | Admit: 2017-07-29 | Discharge: 2017-07-29 | Disposition: A | Discharge status: Home / Self Care | Payer: PPO | Source: Ambulatory Visit | Attending: Ophthalmology | Admitting: Ophthalmology

## 2017-07-29 ENCOUNTER — Other Ambulatory Visit: Payer: Self-pay

## 2017-07-29 DIAGNOSIS — I081 Rheumatic disorders of both mitral and tricuspid valves: Secondary | ICD-10-CM | POA: Diagnosis not present

## 2017-07-29 DIAGNOSIS — H2512 Age-related nuclear cataract, left eye: Secondary | ICD-10-CM | POA: Insufficient documentation

## 2017-07-29 DIAGNOSIS — Z79899 Other long term (current) drug therapy: Secondary | ICD-10-CM | POA: Diagnosis not present

## 2017-07-29 DIAGNOSIS — I110 Hypertensive heart disease with heart failure: Secondary | ICD-10-CM | POA: Diagnosis not present

## 2017-07-29 DIAGNOSIS — E785 Hyperlipidemia, unspecified: Secondary | ICD-10-CM | POA: Insufficient documentation

## 2017-07-29 DIAGNOSIS — Z7982 Long term (current) use of aspirin: Secondary | ICD-10-CM | POA: Diagnosis not present

## 2017-07-29 DIAGNOSIS — M1990 Unspecified osteoarthritis, unspecified site: Secondary | ICD-10-CM | POA: Insufficient documentation

## 2017-07-29 DIAGNOSIS — G4730 Sleep apnea, unspecified: Secondary | ICD-10-CM | POA: Insufficient documentation

## 2017-07-29 DIAGNOSIS — I509 Heart failure, unspecified: Secondary | ICD-10-CM | POA: Diagnosis not present

## 2017-07-29 HISTORY — PX: CATARACT EXTRACTION W/PHACO: SHX586

## 2017-07-29 HISTORY — DX: Cardiomegaly: I51.7

## 2017-07-29 HISTORY — DX: Unspecified osteoarthritis, unspecified site: M19.90

## 2017-07-29 HISTORY — DX: Sleep apnea, unspecified: G47.30

## 2017-07-29 SURGERY — PHACOEMULSIFICATION, CATARACT, WITH IOL INSERTION
Anesthesia: Monitor Anesthesia Care | Site: Eye | Laterality: Left | Wound class: Clean

## 2017-07-29 MED ORDER — SODIUM CHLORIDE 0.9 % IV SOLN
INTRAVENOUS | Status: DC
  Administered 2017-07-29: 10:00:00 via INTRAVENOUS

## 2017-07-29 MED ORDER — EPINEPHRINE PF 1 MG/ML IJ SOLN
INTRAMUSCULAR | Status: AC
  Filled 2017-07-29: qty 1

## 2017-07-29 MED ORDER — CARBACHOL 0.01 % IO SOLN
INTRAOCULAR | Status: DC | PRN
  Administered 2017-07-29: .5 mL via INTRAOCULAR

## 2017-07-29 MED ORDER — FENTANYL CITRATE (PF) 100 MCG/2ML IJ SOLN
INTRAMUSCULAR | Status: AC
  Filled 2017-07-29: qty 2

## 2017-07-29 MED ORDER — LIDOCAINE HCL (PF) 4 % IJ SOLN
INTRAMUSCULAR | Status: AC
Start: 2017-07-29 — End: ?
  Filled 2017-07-29: qty 5

## 2017-07-29 MED ORDER — NA CHONDROIT SULF-NA HYALURON 40-17 MG/ML IO SOLN
INTRAOCULAR | Status: DC | PRN
  Administered 2017-07-29: 1 mL via INTRAOCULAR

## 2017-07-29 MED ORDER — ARMC OPHTHALMIC DILATING DROPS
1.0000 | OPHTHALMIC | Status: AC
Start: 2017-07-29 — End: 2017-07-29
  Administered 2017-07-29 (×3): 1 via OPHTHALMIC

## 2017-07-29 MED ORDER — POVIDONE-IODINE 5 % OP SOLN
OPHTHALMIC | Status: DC | PRN
  Administered 2017-07-29: 1 via OPHTHALMIC

## 2017-07-29 MED ORDER — MOXIFLOXACIN HCL 0.5 % OP SOLN: 1.0000 [drp] | OPHTHALMIC | Status: DC | PRN

## 2017-07-29 MED ORDER — MOXIFLOXACIN HCL 0.5 % OP SOLN
OPHTHALMIC | Status: DC | PRN
  Administered 2017-07-29: .2 mL via OPHTHALMIC

## 2017-07-29 MED ORDER — ARMC OPHTHALMIC DILATING DROPS
OPHTHALMIC | Status: AC
  Administered 2017-07-29: 1 via OPHTHALMIC
  Filled 2017-07-29: qty 0.4

## 2017-07-29 MED ORDER — EPINEPHRINE PF 1 MG/ML IJ SOLN
INTRAOCULAR | Status: DC | PRN
  Administered 2017-07-29: 1 mL via OPHTHALMIC

## 2017-07-29 MED ORDER — MIDAZOLAM HCL 2 MG/2ML IJ SOLN
INTRAMUSCULAR | Status: DC | PRN
  Administered 2017-07-29: 1 mg via INTRAVENOUS

## 2017-07-29 MED ORDER — FENTANYL CITRATE (PF) 100 MCG/2ML IJ SOLN
INTRAMUSCULAR | Status: DC | PRN
  Administered 2017-07-29: 50 ug via INTRAVENOUS

## 2017-07-29 MED ORDER — MIDAZOLAM HCL 2 MG/2ML IJ SOLN
INTRAMUSCULAR | Status: AC
  Filled 2017-07-29: qty 2

## 2017-07-29 MED ORDER — MOXIFLOXACIN HCL 0.5 % OP SOLN
OPHTHALMIC | Status: AC
  Filled 2017-07-29: qty 3

## 2017-07-29 MED ORDER — NA CHONDROIT SULF-NA HYALURON 40-17 MG/ML IO SOLN
INTRAOCULAR | Status: AC
  Filled 2017-07-29: qty 1

## 2017-07-29 MED ORDER — BSS IO SOLN
INTRAOCULAR | Status: DC | PRN
  Administered 2017-07-29: 2 mL via OPHTHALMIC

## 2017-07-29 MED ORDER — POVIDONE-IODINE 5 % OP SOLN
OPHTHALMIC | Status: AC
  Filled 2017-07-29: qty 30

## 2017-07-29 SURGICAL SUPPLY — 16 items
GLOVE BIO SURGEON STRL SZ8 (GLOVE) ×2 IMPLANT
GLOVE BIOGEL M 6.5 STRL (GLOVE) ×2 IMPLANT
GLOVE SURG LX 8.0 MICRO (GLOVE) ×1
GLOVE SURG LX STRL 8.0 MICRO (GLOVE) ×1 IMPLANT
GOWN STRL REUS W/ TWL LRG LVL3 (GOWN DISPOSABLE) ×2 IMPLANT
GOWN STRL REUS W/TWL LRG LVL3 (GOWN DISPOSABLE) ×2
LABEL CATARACT MEDS ST (LABEL) ×2 IMPLANT
LENS IOL TECNIS ITEC 20.0 (Intraocular Lens) ×2 IMPLANT
PACK CATARACT (MISCELLANEOUS) ×2 IMPLANT
PACK CATARACT BRASINGTON LX (MISCELLANEOUS) ×2 IMPLANT
PACK EYE AFTER SURG (MISCELLANEOUS) ×2 IMPLANT
SOL BSS BAG (MISCELLANEOUS) ×2
SOLUTION BSS BAG (MISCELLANEOUS) ×1 IMPLANT
SYR 5ML LL (SYRINGE) ×2 IMPLANT
WATER STERILE IRR 250ML POUR (IV SOLUTION) ×2 IMPLANT
WIPE NON LINTING 3.25X3.25 (MISCELLANEOUS) ×2 IMPLANT

## 2017-07-29 NOTE — Discharge Instructions (Signed)
Eye Surgery Discharge Instructions  Expect mild scratchy sensation or mild soreness. DO NOT RUB YOUR EYE!  The day of surgery:  Minimal physical activity, but bed rest is not required  No reading, computer work, or close hand work  No bending, lifting, or straining.  May watch TV  For 24 hours:  No driving, legal decisions, or alcoholic beverages  Safety precautions  Eat anything you prefer: It is better to start with liquids, then soup then solid foods.  _____ Eye patch should be worn until postoperative exam tomorrow.  ____ Solar shield eyeglasses should be worn for comfort in the sunlight/patch while sleeping  Resume all regular medications including aspirin or Coumadin if these were discontinued prior to surgery. You may shower, bathe, shave, or wash your hair. Tylenol may be taken for mild discomfort.  Call your doctor if you experience significant pain, nausea, or vomiting, fever > 101 or other signs of infection. [phone] or [phone] Specific instructions:  Follow-up Information    Birder Robson, MD Follow up.   Specialty:  Ophthalmology Why:  November 7 at 9:35am Contact information: [address] [place] [address] [phone]

## 2017-07-29 NOTE — Transfer of Care (Signed)
Immediate Anesthesia Transfer of Care Note  Patient: Dawn Roberts  Procedure(s) Performed: CATARACT EXTRACTION PHACO AND INTRAOCULAR LENS PLACEMENT (IOC)-LEFT (Left Eye)  Patient Location: PACU  Anesthesia Type:MAC  Level of Consciousness: awake, alert  and oriented  Airway & Oxygen Therapy: Patient Spontanous Breathing  Post-op Assessment: Report given to RN and Post -op Vital signs reviewed and stable  Post vital signs: Reviewed and stable  Last Vitals:  Vitals:   07/29/17 0928 07/29/17 1108  BP: (!) 140/54 107/60  Pulse: 63   Resp: 16 13  Temp: 36.7 C   SpO2: 100% 99%    Last Pain:  Vitals:   07/29/17 0928  TempSrc: Oral         Complications: No apparent anesthesia complications

## 2017-07-29 NOTE — Anesthesia Postprocedure Evaluation (Signed)
Anesthesia Post Note  Patient: Dawn Roberts  Procedure(s) Performed: CATARACT EXTRACTION PHACO AND INTRAOCULAR LENS PLACEMENT (IOC)-LEFT (Left Eye)  Patient location during evaluation: PACU Anesthesia Type: MAC Level of consciousness: awake, awake and alert and oriented Pain management: pain level controlled Vital Signs Assessment: post-procedure vital signs reviewed and stable Respiratory status: spontaneous breathing, nonlabored ventilation and respiratory function stable Cardiovascular status: stable Anesthetic complications: no     Last Vitals:  Vitals:   07/29/17 0928 07/29/17 1108  BP: (!) 140/54 107/60  Pulse: 63   Resp: 16 13  Temp: 36.7 C   SpO2: 100% 99%    Last Pain:  Vitals:   07/29/17 0928  TempSrc: Oral                 Lance Muss

## 2017-07-29 NOTE — H&P (Signed)
All labs reviewed. Abnormal studies sent to patients PCP when indicated.  Previous H&P reviewed, patient examined, there are NO CHANGES.  Dawn Umland LOUIS11/6/201810:41 AM

## 2017-07-29 NOTE — Anesthesia Post-op Follow-up Note (Signed)
Anesthesia QCDR form completed.        

## 2017-07-29 NOTE — Anesthesia Preprocedure Evaluation (Signed)
Anesthesia Evaluation  Patient identified by MRN, date of birth, ID band Patient awake    Reviewed: Allergy & Precautions, NPO status , Patient's Chart, lab work & pertinent test results  History of Anesthesia Complications Negative for: history of anesthetic complications  Airway Mallampati: II  TM Distance: >3 FB Neck ROM: Full    Dental no notable dental hx.    Pulmonary neg sleep apnea, neg COPD,    breath sounds clear to auscultation- rhonchi (-) wheezing      Cardiovascular Exercise Tolerance: Good hypertension, Pt. on medications +CHF  (-) CAD, (-) Past MI and (-) Cardiac Stents  Rhythm:Regular Rate:Normal - Systolic murmurs and - Diastolic murmurs 11/24/72: MODERATE LV SYSTOLIC DYSFUNCTION NORMAL RIGHT VENTRICULAR SYSTOLIC FUNCTION MILD VALVULAR REGURGITATION (trivial MR, mild TR) NO VALVULAR STENOSIS EF 35-40%   Neuro/Psych negative neurological ROS  negative psych ROS   GI/Hepatic negative GI ROS, Neg liver ROS,   Endo/Other  negative endocrine ROSneg diabetes  Renal/GU negative Renal ROS     Musculoskeletal  (+) Arthritis ,   Abdominal (+) - obese,   Peds  Hematology  (+) anemia ,   Anesthesia Other Findings Past Medical History: No date: Allergy No date: Anemia No date: Arthritis No date: Cardiomegaly No date: CHF (congestive heart failure) (HCC) No date: Hyperlipidemia No date: Hypertension No date: Sleep apnea     Comment:  MILD   Reproductive/Obstetrics                             Anesthesia Physical Anesthesia Plan  ASA: III  Anesthesia Plan: MAC   Post-op Pain Management:    Induction: Intravenous  PONV Risk Score and Plan: 2 and Midazolam  Airway Management Planned: Natural Airway  Additional Equipment:   Intra-op Plan:   Post-operative Plan:   Informed Consent: I have reviewed the patients History and Physical, chart, labs and discussed the  procedure including the risks, benefits and alternatives for the proposed anesthesia with the patient or authorized representative who has indicated his/her understanding and acceptance.     Plan Discussed with: CRNA and Anesthesiologist  Anesthesia Plan Comments:         Anesthesia Quick Evaluation

## 2017-07-29 NOTE — Op Note (Signed)
PREOPERATIVE DIAGNOSIS:  Nuclear sclerotic cataract of the left eye.   POSTOPERATIVE DIAGNOSIS:  Nuclear sclerotic cataract of the left eye.   OPERATIVE PROCEDURE: Procedure(s): CATARACT EXTRACTION PHACO AND INTRAOCULAR LENS PLACEMENT (IOC)-LEFT   SURGEON:  Birder Robson, MD.   ANESTHESIA:  Anesthesiologist: Emmie Niemann, MD CRNA: Lance Muss, CRNA  1.      Managed anesthesia care. 2.     0.41ml of Shugarcaine was instilled following the paracentesis   COMPLICATIONS:  None.   TECHNIQUE:   Stop and chop   DESCRIPTION OF PROCEDURE:  The patient was examined and consented in the preoperative holding area where the aforementioned topical anesthesia was applied to the left eye and then brought back to the Operating Room where the left eye was prepped and draped in the usual sterile ophthalmic fashion and a lid speculum was placed. A paracentesis was created with the side port blade and the anterior chamber was filled with viscoelastic. A near clear corneal incision was performed with the steel keratome. A continuous curvilinear capsulorrhexis was performed with a cystotome followed by the capsulorrhexis forceps. Hydrodissection and hydrodelineation were carried out with BSS on a blunt cannula. The lens was removed in a stop and chop  technique and the remaining cortical material was removed with the irrigation-aspiration handpiece. The capsular bag was inflated with viscoelastic and the Technis [id number] lens was placed in the capsular bag without complication. The remaining viscoelastic was removed from the eye with the irrigation-aspiration handpiece. The wounds were hydrated. The anterior chamber was flushed with Miostat and the eye was inflated to physiologic pressure. 0.61ml Vigamox was placed in the anterior chamber. The wounds were found to be water tight. The eye was dressed with Vigamox. The patient was given protective glasses to wear throughout the day and a shield with which to sleep  tonight. The patient was also given drops with which to begin a drop regimen today and will follow-up with me in one day. Implant Name Type Inv. Item Serial No. Manufacturer Lot No. LRB No. Used  LENS IOL DIOP 20.0 - [id number] Intraocular Lens LENS IOL DIOP 20.0 [id number] AMO  Left 1    Procedure(s) with comments: CATARACT EXTRACTION PHACO AND INTRAOCULAR LENS PLACEMENT (IOC)-LEFT (Left) - [place] 00:49 AP% 15.6 CDE 7.78 Fluid pack lot # [id number]  Electronically signed: [place] 07/29/2017 11:07 AM

## 2017-08-17 DIAGNOSIS — Z1379 Encounter for other screening for genetic and chromosomal anomalies: Secondary | ICD-10-CM | POA: Insufficient documentation

## 2017-08-17 NOTE — Progress Notes (Signed)
[place]  [phone] [phone]  ID: Dawn Roberts OB: 22-Feb-1946  MR#: [id number]  SPQ#:[id number]  Patient Care Team: [name] as PCP - General (Family Medicine) [name], MD (General Surgery) Margarita Rana, MD as Referring Physician (Family Medicine)  CHIEF COMPLAINT: Genetetic testing and counseling.  INTERVAL HISTORY: Patient is a 71 year old female with no personal history of malignancy, but has 1 sister with breast cancer, one sister deceased from ovarian cancer, and several aunts also with breast cancer.  She currently feels well and is asymptomatic.  She has no neurologic complaints.  She denies any recent fevers or illnesses.  She has a good appetite and denies weight loss.  She has no chest pain or shortness of breath.  She denies any nausea, vomiting, constipation, or diarrhea.  She has no urinary complaints.  Patient feels at her baseline and offers no specific complaints today.  REVIEW OF SYSTEMS:   Review of Systems  Constitutional: Negative.  Negative for fever, malaise/fatigue and weight loss.  Respiratory: Negative.  Negative for cough and shortness of breath.   Cardiovascular: Negative.  Negative for chest pain and leg swelling.  Gastrointestinal: Negative.  Negative for abdominal pain.  Genitourinary: Negative.   Musculoskeletal: Negative.   Skin: Negative.  Negative for rash.  Neurological: Negative.  Negative for sensory change and weakness.  Psychiatric/Behavioral: Negative.  The patient is not nervous/anxious.     As per HPI. Otherwise, a complete review of systems is negative.  PAST MEDICAL HISTORY: Past Medical History:  Diagnosis Date  . Allergy   . Anemia   . Arthritis   . Cardiomegaly   . CHF (congestive heart failure) ([place])   . Hyperlipidemia   . Hypertension   . Sleep apnea    MILD    PAST SURGICAL HISTORY: Past Surgical History:  Procedure Laterality Date  .  BREAST BIOPSY Right 1976   neg  . BREAST MASS EXCISION Right 1976   benign  . CATARACT EXTRACTION W/PHACO Left 07/29/2017   Procedure: CATARACT EXTRACTION PHACO AND INTRAOCULAR LENS PLACEMENT ([facility])-LEFT;  Surgeon: Birder Robson, MD;  Location: [facility] ORS;  Service: Ophthalmology;  Laterality: Left;  [place] 00:49 AP% 15.6 CDE 7.78 Fluid pack lot # [id number]  . COLONOSCOPY  03-29-05   Dr [name]  . COLONOSCOPY N/A 02/01/2015   Procedure: COLONOSCOPY;  Surgeon: [name], MD;  Location: [facility] ENDOSCOPY;  Service: Endoscopy;  Laterality: N/A;  . venous closure procedure Right 07-25-05   Dr. [name]    FAMILY HISTORY: Family History  Problem Relation Age of Onset  . Diabetes Father   . Ovarian cancer Sister   . Breast cancer Sister 54  . Healthy Sister   . Healthy Brother   . Healthy Sister   . Breast cancer Maternal Aunt     ADVANCED DIRECTIVES (Y/N):  N  HEALTH MAINTENANCE: Social History   Tobacco Use  . Smoking status: Never Smoker  . Smokeless tobacco: Never Used  Substance Use Topics  . Alcohol use: No    Alcohol/week: 0.0 oz  . Drug use: No     Colonoscopy:  PAP:  Bone density:  Lipid panel:  No Known Allergies  Current Outpatient Medications  Medication Sig Dispense Refill  . carvedilol (COREG) 6.25 MG tablet Take 6.25 mg by mouth 2 (two) times daily.  5  . losartan (COZAAR) 50 MG tablet Take 50 mg by mouth daily.     Marland Kitchen spironolactone (ALDACTONE) 25 MG tablet  Take 25 mg by mouth daily.  5  . Ascorbic Acid (VITAMIN C) 1000 MG tablet Take 1,000 mg by mouth daily.    . Calcium-Vitamin D 600-200 MG-UNIT per tablet Take 1 tablet by mouth 2 (two) times daily.     . cyanocobalamin 1000 MCG tablet Take 1,000 mcg by mouth daily.     . Misc Natural Products (OSTEO BI-FLEX ADV JOINT SHIELD PO) Take 1 tablet by mouth daily.    . Multiple Vitamin (MULTI-VITAMINS) TABS Take 1 tablet by mouth daily.     Marland Kitchen tretinoin (RETIN-A) 0.1 % cream APPLY 1 APPLICATION ON THE SKIN  AT BEDTIME  6   No current facility-administered medications for this visit.     OBJECTIVE: Vitals:   08/19/17 1523  BP: (!) 148/83  Pulse: 70  Resp: 20  Temp: (!) 96.7 F (35.9 C)     Body mass index is 22.89 kg/m.    ECOG FS:0 - Asymptomatic  General: Well-developed, well-nourished, no acute distress. Eyes: Pink conjunctiva, anicteric sclera. Musculoskeletal: No edema, cyanosis, or clubbing. Neuro: Alert, answering all questions appropriately. Cranial nerves grossly intact. Skin: No rashes or petechiae noted. Psych: Normal affect.   LAB RESULTS:  Lab Results  Component Value Date   NA 142 01/28/2017   K 4.2 01/28/2017   CL 101 01/28/2017   CO2 28 01/28/2017   GLUCOSE 107 (H) 01/28/2017   BUN 20 01/28/2017   CREATININE 0.97 01/28/2017   CALCIUM 9.7 01/28/2017   PROT 6.8 01/28/2017   ALBUMIN 4.5 01/28/2017   AST 19 01/28/2017   ALT 21 01/28/2017   ALKPHOS 33 (L) 01/28/2017   BILITOT 0.3 01/28/2017   GFRNONAA 59 (L) 01/28/2017   GFRAA 68 01/28/2017    Lab Results  Component Value Date   WBC 5.2 01/28/2017   NEUTROABS 2.7 01/28/2017   HGB 11.7 01/28/2017   HCT 37.2 01/28/2017   MCV 91 01/28/2017   PLT 227 01/28/2017     STUDIES: No results found.  ASSESSMENT: Genetetic testing and counseling  PLAN:  1. Genetetic testing and counseling: Although patient has no personal history of malignancy, she has a significant family history with one sister with breast cancer and another sister deceased from ovarian cancer.  She also has several aunts with breast cancer.  Patient will benefit from genetic testing today.  If her test returns positive, patient will return to clinic to further discuss the mutation as well as prophylactic options that she may undertake.  If her mutation is negative, she expressed understanding that she is at higher risk for developing malignancy than the general population and to continue with cancer screening as she is already  doing.  Approximately 60 minutes was spent in discussion of which greater than 50% was consultation.  Patient expressed understanding and was in agreement with this plan. She also understands that She can call clinic at any time with any questions, concerns, or complaints.    Lloyd Huger, MD   08/22/2017 12:58 PM

## 2017-08-19 ENCOUNTER — Inpatient Hospital Stay: Payer: PPO | Attending: Oncology | Admitting: Oncology

## 2017-08-19 ENCOUNTER — Other Ambulatory Visit: Payer: Self-pay

## 2017-08-19 DIAGNOSIS — E785 Hyperlipidemia, unspecified: Secondary | ICD-10-CM | POA: Insufficient documentation

## 2017-08-19 DIAGNOSIS — Z803 Family history of malignant neoplasm of breast: Secondary | ICD-10-CM | POA: Diagnosis not present

## 2017-08-19 DIAGNOSIS — Z79899 Other long term (current) drug therapy: Secondary | ICD-10-CM | POA: Diagnosis not present

## 2017-08-19 DIAGNOSIS — G4730 Sleep apnea, unspecified: Secondary | ICD-10-CM | POA: Insufficient documentation

## 2017-08-19 DIAGNOSIS — Z8041 Family history of malignant neoplasm of ovary: Secondary | ICD-10-CM | POA: Diagnosis not present

## 2017-08-19 DIAGNOSIS — I110 Hypertensive heart disease with heart failure: Secondary | ICD-10-CM | POA: Insufficient documentation

## 2017-08-19 DIAGNOSIS — I509 Heart failure, unspecified: Secondary | ICD-10-CM | POA: Insufficient documentation

## 2017-08-19 DIAGNOSIS — Z9842 Cataract extraction status, left eye: Secondary | ICD-10-CM | POA: Insufficient documentation

## 2017-08-19 DIAGNOSIS — Z1379 Encounter for other screening for genetic and chromosomal anomalies: Secondary | ICD-10-CM | POA: Diagnosis not present

## 2017-08-19 NOTE — Progress Notes (Signed)
Patient here today for genetic counseling.

## 2017-08-21 DIAGNOSIS — H2511 Age-related nuclear cataract, right eye: Secondary | ICD-10-CM | POA: Diagnosis not present

## 2017-09-02 ENCOUNTER — Ambulatory Visit: Payer: PPO | Admitting: Registered Nurse

## 2017-09-02 ENCOUNTER — Other Ambulatory Visit: Payer: Self-pay

## 2017-09-02 ENCOUNTER — Ambulatory Visit
Admission: RE | Admit: 2017-09-02 | Discharge: 2017-09-02 | Disposition: A | Discharge status: Home / Self Care | Payer: PPO | Source: Ambulatory Visit | Attending: Ophthalmology | Admitting: Ophthalmology

## 2017-09-02 ENCOUNTER — Encounter
Admission: RE | Disposition: A | Discharge status: Home / Self Care | Payer: Self-pay | Source: Ambulatory Visit | Attending: Ophthalmology

## 2017-09-02 DIAGNOSIS — H2511 Age-related nuclear cataract, right eye: Secondary | ICD-10-CM | POA: Diagnosis not present

## 2017-09-02 DIAGNOSIS — Z7982 Long term (current) use of aspirin: Secondary | ICD-10-CM | POA: Insufficient documentation

## 2017-09-02 DIAGNOSIS — I110 Hypertensive heart disease with heart failure: Secondary | ICD-10-CM | POA: Insufficient documentation

## 2017-09-02 DIAGNOSIS — G4730 Sleep apnea, unspecified: Secondary | ICD-10-CM | POA: Insufficient documentation

## 2017-09-02 DIAGNOSIS — Z79899 Other long term (current) drug therapy: Secondary | ICD-10-CM | POA: Diagnosis not present

## 2017-09-02 DIAGNOSIS — I509 Heart failure, unspecified: Secondary | ICD-10-CM | POA: Insufficient documentation

## 2017-09-02 HISTORY — PX: CATARACT EXTRACTION W/PHACO: SHX586

## 2017-09-02 SURGERY — PHACOEMULSIFICATION, CATARACT, WITH IOL INSERTION
Anesthesia: Monitor Anesthesia Care | Site: Eye | Laterality: Right | Wound class: Clean

## 2017-09-02 MED ORDER — LIDOCAINE HCL (PF) 4 % IJ SOLN
INTRAMUSCULAR | Status: DC | PRN
  Administered 2017-09-02: 4 mL via OPHTHALMIC

## 2017-09-02 MED ORDER — MIDAZOLAM HCL 2 MG/2ML IJ SOLN
INTRAMUSCULAR | Status: AC
  Filled 2017-09-02: qty 2

## 2017-09-02 MED ORDER — CARBACHOL 0.01 % IO SOLN
INTRAOCULAR | Status: DC | PRN
  Administered 2017-09-02: 0.5 mL via INTRAOCULAR

## 2017-09-02 MED ORDER — MOXIFLOXACIN HCL 0.5 % OP SOLN
OPHTHALMIC | Status: DC | PRN
  Administered 2017-09-02: 0.2 mL via OPHTHALMIC

## 2017-09-02 MED ORDER — EPINEPHRINE PF 1 MG/ML IJ SOLN
INTRAMUSCULAR | Status: DC | PRN
  Administered 2017-09-02: 10:00:00 via OPHTHALMIC

## 2017-09-02 MED ORDER — MIDAZOLAM HCL 2 MG/2ML IJ SOLN
INTRAMUSCULAR | Status: DC | PRN
  Administered 2017-09-02: 1 mg via INTRAVENOUS

## 2017-09-02 MED ORDER — POVIDONE-IODINE 5 % OP SOLN
OPHTHALMIC | Status: DC | PRN
  Administered 2017-09-02: 1 via OPHTHALMIC

## 2017-09-02 MED ORDER — SODIUM CHLORIDE 0.9 % IV SOLN
INTRAVENOUS | Status: DC
  Administered 2017-09-02: 09:00:00 via INTRAVENOUS

## 2017-09-02 MED ORDER — NA CHONDROIT SULF-NA HYALURON 40-17 MG/ML IO SOLN
INTRAOCULAR | Status: DC | PRN
  Administered 2017-09-02: 1 mL via INTRAOCULAR

## 2017-09-02 MED ORDER — ARMC OPHTHALMIC DILATING DROPS
OPHTHALMIC | Status: AC
  Administered 2017-09-02: 1 via OPHTHALMIC
  Filled 2017-09-02: qty 0.4

## 2017-09-02 MED ORDER — FENTANYL CITRATE (PF) 100 MCG/2ML IJ SOLN
INTRAMUSCULAR | Status: AC
  Filled 2017-09-02: qty 2

## 2017-09-02 MED ORDER — MOXIFLOXACIN HCL 0.5 % OP SOLN
OPHTHALMIC | Status: AC
  Filled 2017-09-02: qty 3

## 2017-09-02 MED ORDER — ARMC OPHTHALMIC DILATING DROPS
1.0000 "application " | OPHTHALMIC | Status: AC
  Administered 2017-09-02 (×3): 1 via OPHTHALMIC

## 2017-09-02 MED ORDER — FENTANYL CITRATE (PF) 100 MCG/2ML IJ SOLN
INTRAMUSCULAR | Status: DC | PRN
  Administered 2017-09-02: 50 ug via INTRAVENOUS

## 2017-09-02 MED ORDER — MOXIFLOXACIN HCL 0.5 % OP SOLN: 1.0000 [drp] | OPHTHALMIC | Status: DC | PRN

## 2017-09-02 SURGICAL SUPPLY — 16 items

## 2017-09-02 NOTE — Anesthesia Post-op Follow-up Note (Signed)
Anesthesia QCDR form completed.        

## 2017-09-02 NOTE — H&P (Signed)
All labs reviewed. Abnormal studies sent to patients PCP when indicated.  Previous H&P reviewed, patient examined, there are NO CHANGES.  Dawn Cicero LOUIS12/11/201810:13 AM

## 2017-09-02 NOTE — Anesthesia Postprocedure Evaluation (Signed)
Anesthesia Post Note  Patient: Dawn Roberts  Procedure(s) Performed: CATARACT EXTRACTION PHACO AND INTRAOCULAR LENS PLACEMENT ([place]) (Right Eye)  Patient location during evaluation: PACU Anesthesia Type: MAC Level of consciousness: awake Pain management: pain level controlled Vital Signs Assessment: post-procedure vital signs reviewed and stable Respiratory status: spontaneous breathing Cardiovascular status: blood pressure returned to baseline Postop Assessment: no headache, no backache, adequate PO intake and no apparent nausea or vomiting Anesthetic complications: no     Last Vitals:  Vitals:   09/02/17 1039 09/02/17 1040  BP: (!) 107/44 (!) 107/44  Pulse: 60 61  Resp: 13   Temp: (!) 36.2 C (!) 36.2 C  SpO2: 97% 97%    Last Pain:  Vitals:   09/02/17 1039  TempSrc: Temporal  PainSc: 0-No pain                 Justiss Gerbino Lorenza Chick

## 2017-09-02 NOTE — Op Note (Signed)
PREOPERATIVE DIAGNOSIS:  Nuclear sclerotic cataract of the right eye.   POSTOPERATIVE DIAGNOSIS:   NUCLEAR SCLEROTIC CATARACT RIGHT  EYE   OPERATIVE PROCEDURE: Procedure(s): CATARACT EXTRACTION PHACO AND INTRAOCULAR LENS PLACEMENT (IOC)   SURGEON:  Birder Robson, MD.   ANESTHESIA:  Anesthesiologist: [facility], Jane Canary, MD CRNA: Hedda Slade, CRNA  1.      Managed anesthesia care. 2.      0.76ml of Shugarcaine was instilled in the eye following the paracentesis.   COMPLICATIONS:  None.   TECHNIQUE:   Stop and chop   DESCRIPTION OF PROCEDURE:  The patient was examined and consented in the preoperative holding area where the aforementioned topical anesthesia was applied to the right eye and then brought back to the Operating Room where the right eye was prepped and draped in the usual sterile ophthalmic fashion and a lid speculum was placed. A paracentesis was created with the side port blade and the anterior chamber was filled with viscoelastic. A near clear corneal incision was performed with the steel keratome. A continuous curvilinear capsulorrhexis was performed with a cystotome followed by the capsulorrhexis forceps. Hydrodissection and hydrodelineation were carried out with BSS on a blunt cannula. The lens was removed in a stop and chop  technique and the remaining cortical material was removed with the irrigation-aspiration handpiece. The capsular bag was inflated with viscoelastic and the Technis [id number]  lens was placed in the capsular bag without complication. The remaining viscoelastic was removed from the eye with the irrigation-aspiration handpiece. The wounds were hydrated. The anterior chamber was flushed with Miostat and the eye was inflated to physiologic pressure. 0.65ml of Vigamox was placed in the anterior chamber. The wounds were found to be water tight. The eye was dressed with Vigamox. The patient was given protective glasses to wear throughout the day and a shield  with which to sleep tonight. The patient was also given drops with which to begin a drop regimen today and will follow-up with me in one day. Implant Name Type Inv. Item Serial No. Manufacturer Lot No. LRB No. Used  LENS IOL DIOP 21.5 - [id number] Intraocular Lens LENS IOL DIOP 21.5 [id number]  Right 1   Procedure(s) with comments: CATARACT EXTRACTION PHACO AND INTRAOCULAR LENS PLACEMENT (I[facility]) (Right) - [place] 00:58.7 AP% 18.0 CDE 10.58 Fluid Pack lot # [id number]  Electronically signed: [place] 09/02/2017 10:38 AM

## 2017-09-02 NOTE — Discharge Instructions (Signed)
Eye Surgery Discharge Instructions  Expect mild scratchy sensation or mild soreness. DO NOT RUB YOUR EYE!  The day of surgery:  Minimal physical activity, but bed rest is not required  No reading, computer work, or close hand work  No bending, lifting, or straining.  May watch TV  For 24 hours:  No driving, legal decisions, or alcoholic beverages  Safety precautions  Eat anything you prefer: It is better to start with liquids, then soup then solid foods.  _____ Eye patch should be worn until postoperative exam tomorrow.  ____ Solar shield eyeglasses should be worn for comfort in the sunlight/patch while sleeping  Resume all regular medications including aspirin or Coumadin if these were discontinued prior to surgery. You may shower, bathe, shave, or wash your hair. Tylenol may be taken for mild discomfort.  Call your doctor if you experience significant pain, nausea, or vomiting, fever > 101 or other signs of infection. [phone] or [phone] Specific instructions:  Follow-up Information    Birder Robson, MD Follow up on 09/03/2017.   Specialty:  Ophthalmology Why:  10:10 AM Contact information: [address] [place] [address] [phone]

## 2017-09-02 NOTE — Anesthesia Preprocedure Evaluation (Signed)
Anesthesia Evaluation  Patient identified by MRN, date of birth, ID band  Reviewed: Allergy & Precautions, NPO status   Airway Mallampati: II       Dental  (+) Teeth Intact   Pulmonary sleep apnea ,    breath sounds clear to auscultation       Cardiovascular hypertension,  Rhythm:Regular Rate:Normal     Neuro/Psych negative neurological ROS     GI/Hepatic negative GI ROS, Neg liver ROS,   Endo/Other  diabetes  Renal/GU negative Renal ROS     Musculoskeletal   Abdominal Normal abdominal exam  (+)   Peds negative pediatric ROS (+)  Hematology  (+) anemia ,   Anesthesia Other Findings   Reproductive/Obstetrics                             Anesthesia Physical Anesthesia Plan  ASA: II  Anesthesia Plan: MAC   Post-op Pain Management:    Induction: Intravenous  PONV Risk Score and Plan:   Airway Management Planned: Natural Airway and Nasal Cannula  Additional Equipment:   Intra-op Plan:   Post-operative Plan:   Informed Consent: I have reviewed the patients History and Physical, chart, labs and discussed the procedure including the risks, benefits and alternatives for the proposed anesthesia with the patient or authorized representative who has indicated his/her understanding and acceptance.     Plan Discussed with: CRNA  Anesthesia Plan Comments:         Anesthesia Quick Evaluation

## 2017-09-02 NOTE — Transfer of Care (Signed)
Immediate Anesthesia Transfer of Care Note  Patient: Dawn Roberts  Procedure(s) Performed: CATARACT EXTRACTION PHACO AND INTRAOCULAR LENS PLACEMENT (I[facility]) (Right Eye)  Patient Location: PACU  Anesthesia Type:MAC  Level of Consciousness: awake, alert  and oriented  Airway & Oxygen Therapy: Patient Spontanous Breathing  Post-op Assessment: Report given to RN and Post -op Vital signs reviewed and stable  Post vital signs: Reviewed and stable  Last Vitals:  Vitals:   09/02/17 0914 09/02/17 1040  BP: (!) 118/49 (!) 107/44  Pulse:  61  Temp:  (!) 36.2 C  SpO2:  97%    Last Pain:  Vitals:   09/02/17 0913  TempSrc: Temporal         Complications: No apparent anesthesia complications

## 2017-09-03 ENCOUNTER — Encounter: Payer: Self-pay | Admitting: Ophthalmology

## 2017-10-10 DIAGNOSIS — Z961 Presence of intraocular lens: Secondary | ICD-10-CM | POA: Diagnosis not present

## 2017-10-24 DIAGNOSIS — I420 Dilated cardiomyopathy: Secondary | ICD-10-CM | POA: Diagnosis not present

## 2017-10-24 DIAGNOSIS — G4730 Sleep apnea, unspecified: Secondary | ICD-10-CM | POA: Diagnosis not present

## 2017-10-24 DIAGNOSIS — I5022 Chronic systolic (congestive) heart failure: Secondary | ICD-10-CM | POA: Diagnosis not present

## 2017-10-24 DIAGNOSIS — I10 Essential (primary) hypertension: Secondary | ICD-10-CM | POA: Diagnosis not present

## 2017-12-05 DIAGNOSIS — Z961 Presence of intraocular lens: Secondary | ICD-10-CM | POA: Diagnosis not present

## 2018-01-12 ENCOUNTER — Other Ambulatory Visit: Payer: Self-pay | Admitting: Physician Assistant

## 2018-01-26 ENCOUNTER — Other Ambulatory Visit: Payer: Self-pay | Admitting: Physician Assistant

## 2018-01-26 DIAGNOSIS — Z1231 Encounter for screening mammogram for malignant neoplasm of breast: Secondary | ICD-10-CM

## 2018-01-29 ENCOUNTER — Ambulatory Visit (INDEPENDENT_AMBULATORY_CARE_PROVIDER_SITE_OTHER): Payer: PPO

## 2018-01-29 ENCOUNTER — Encounter: Payer: Self-pay | Admitting: Physician Assistant

## 2018-01-29 ENCOUNTER — Ambulatory Visit (INDEPENDENT_AMBULATORY_CARE_PROVIDER_SITE_OTHER): Payer: PPO | Admitting: Physician Assistant

## 2018-01-29 VITALS — BP 124/62 | HR 57 | Temp 98.8°F | Ht 65.0 in | Wt 142.4 lb

## 2018-01-29 VITALS — BP 124/62 | HR 57 | Temp 98.8°F | Resp 16 | Ht 65.0 in | Wt 142.0 lb

## 2018-01-29 DIAGNOSIS — D649 Anemia, unspecified: Secondary | ICD-10-CM

## 2018-01-29 DIAGNOSIS — Z0000 Encounter for general adult medical examination without abnormal findings: Secondary | ICD-10-CM

## 2018-01-29 DIAGNOSIS — E782 Mixed hyperlipidemia: Secondary | ICD-10-CM

## 2018-01-29 DIAGNOSIS — Z1231 Encounter for screening mammogram for malignant neoplasm of breast: Secondary | ICD-10-CM

## 2018-01-29 DIAGNOSIS — Z803 Family history of malignant neoplasm of breast: Secondary | ICD-10-CM | POA: Diagnosis not present

## 2018-01-29 DIAGNOSIS — E119 Type 2 diabetes mellitus without complications: Secondary | ICD-10-CM | POA: Diagnosis not present

## 2018-01-29 DIAGNOSIS — I10 Essential (primary) hypertension: Secondary | ICD-10-CM | POA: Diagnosis not present

## 2018-01-29 DIAGNOSIS — Z1239 Encounter for other screening for malignant neoplasm of breast: Secondary | ICD-10-CM

## 2018-01-29 DIAGNOSIS — Z124 Encounter for screening for malignant neoplasm of cervix: Secondary | ICD-10-CM | POA: Diagnosis not present

## 2018-01-29 NOTE — Progress Notes (Signed)
Subjective:   Dawn Roberts is a 72 y.o. female who presents for Medicare Annual (Subsequent) preventive examination.  Review of Systems:  N/A  Cardiac Risk Factors include: advanced age (>61men, >52 women);hypertension;diabetes mellitus     Objective:     Vitals: BP 124/62 (BP Location: Right Arm)   Pulse (!) 57   Temp 98.8 F (37.1 C) (Oral)   Ht 5\' 5"  (1.651 m)   Wt 142 lb 6.4 oz (64.6 kg)   BMI 23.70 kg/m   Body mass index is 23.7 kg/m.  Advanced Directives 01/29/2018 09/02/2017 08/19/2017 01/28/2017 01/24/2016 10/24/2015 03/06/2015  Does Patient Have a Medical Advance Directive? No No No No No No No  Would patient like information on creating a medical advance directive? No - Patient declined No - Patient declined - - - - No - patient declined information    Tobacco Social History   Tobacco Use  Smoking Status Never Smoker  Smokeless Tobacco Never Used     Counseling given: Not Answered   Clinical Intake:  Pre-visit preparation completed: Yes  Pain : No/denies pain Pain Score: 0-No pain     Nutritional Status: BMI of 19-24  Normal Nutritional Risks: None Diabetes: Yes(type 2 - controlled) CBG done?: No Did pt. bring in CBG monitor from home?: No  How often do you need to have someone help you when you read instructions, pamphlets, or other written materials from your doctor or pharmacy?: 1 - Never  Interpreter Needed?: No  Information entered by :: [facility], LPN  Past Medical History:  Diagnosis Date  . Allergy   . Anemia   . Arthritis   . Cardiomegaly   . CHF (congestive heart failure) ([place])   . Hyperlipidemia   . Hypertension   . Sleep apnea    MILD   Past Surgical History:  Procedure Laterality Date  . BREAST BIOPSY Right 1976   neg  . BREAST MASS EXCISION Right 1976   benign  . CATARACT EXTRACTION W/PHACO Left 07/29/2017   Procedure: CATARACT EXTRACTION PHACO AND INTRAOCULAR LENS PLACEMENT ([facility])-LEFT;  Surgeon: Birder Robson, MD;   Location: [facility] ORS;  Service: Ophthalmology;  Laterality: Left;  [place] 00:49 AP% 15.6 CDE 7.78 Fluid pack lot # [id number]  . CATARACT EXTRACTION W/PHACO Right 09/02/2017   Procedure: CATARACT EXTRACTION PHACO AND INTRAOCULAR LENS PLACEMENT ([place]);  Surgeon: Birder Robson, MD;  Location: [facility] ORS;  Service: Ophthalmology;  Laterality: Right;  [place] 00:58.7 AP% 18.0 CDE 10.58 Fluid Pack lot # [id number]  . COLONOSCOPY  03-29-05   Dr [name]  . COLONOSCOPY N/A 02/01/2015   Procedure: COLONOSCOPY;  Surgeon: [name], MD;  Location: [facility] ENDOSCOPY;  Service: Endoscopy;  Laterality: N/A;  . venous closure procedure Right 07-25-05   Dr. [name]   Family History  Problem Relation Age of Onset  . Diabetes Father   . Ovarian cancer Sister   . Breast cancer Sister 21  . Healthy Sister   . Healthy Brother   . Healthy Sister   . Breast cancer Maternal Aunt    Social History   Socioeconomic History  . Marital status: Divorced    Spouse name: Not on file  . Number of children: 4  . Years of education: HS  . Highest education level: 12th grade  Occupational History  . Occupation: retired  Scientific laboratory technician  . Financial resource strain: Not hard at all  . Food insecurity:    Worry: Never true    Inability: Never true  .  Transportation needs:    Medical: No    Non-medical: No  Tobacco Use  . Smoking status: Never Smoker  . Smokeless tobacco: Never Used  Substance and Sexual Activity  . Alcohol use: No    Alcohol/week: 0.0 oz  . Drug use: No  . Sexual activity: Yes    Birth control/protection: Post-menopausal    Comment: pt states, "I have a friend"  Lifestyle  . Physical activity:    Days per week: Not on file    Minutes per session: Not on file  . Stress: Not at all  Relationships  . Social connections:    Talks on phone: Not on file    Gets together: Not on file    Attends religious service: Not on file    Active member of club or organization: Not on file    Attends  meetings of clubs or organizations: Not on file    Relationship status: Not on file  Other Topics Concern  . Not on file  Social History Narrative  . Not on file    Outpatient Encounter Medications as of 01/29/2018  Medication Sig  . aspirin EC 81 MG tablet Take 81 mg by mouth daily with supper.  Marland Kitchen Bioflavonoid Products (VITAMIN C) CHEW Chew 1 tablet by mouth 3 (three) times a week.  . Calcium Carb-Cholecalciferol (CALCIUM + D3 PO) Take 1 tablet by mouth 2 (two) times daily.  . carvedilol (COREG) 6.25 MG tablet Take 6.25 mg by mouth 2 (two) times daily.  Marland Kitchen losartan (COZAAR) 50 MG tablet Take 50 mg by mouth daily.   . Misc Natural Products (OSTEO BI-FLEX ADV JOINT SHIELD PO) Take 1 tablet by mouth 2 (two) times daily.   . Multiple Vitamin (MULTIVITAMIN WITH MINERALS) TABS tablet Take 1 tablet by mouth daily with lunch.  . spironolactone (ALDACTONE) 25 MG tablet Take 25 mg by mouth daily.  Marland Kitchen tretinoin (RETIN-A) 0.1 % cream APPLY 1 APPLICATION ON THE SKIN AT BEDTIME EVERY OTHER NIGHT.  Marland Kitchen vitamin B-12 (CYANOCOBALAMIN) 1000 MCG tablet Take 1,000 mcg by mouth daily.   No facility-administered encounter medications on file as of 01/29/2018.     Activities of Daily Living In your present state of health, do you have any difficulty performing the following activities: 01/29/2018  Hearing? N  Vision? N  Difficulty concentrating or making decisions? N  Walking or climbing stairs? N  Dressing or bathing? N  Doing errands, shopping? N  Preparing Food and eating ? N  Using the Toilet? N  In the past six months, have you accidently leaked urine? N  Do you have problems with loss of bowel control? N  Managing your Medications? N  Managing your Finances? N  Housekeeping or managing your Housekeeping? N  Some recent data might be hidden    Patient Care Team: [name] as PCP - General (Family Medicine) Corey Skains, MD as Consulting Physician (Cardiology) [name], MD as Consulting Physician (Ophthalmology) Birder Robson, MD as Referring Physician (Ophthalmology)    Assessment:   This is a routine wellness examination for Dawn Roberts.  Exercise Activities and Dietary recommendations Current Exercise Habits: Structured exercise class(Does yardwork too.), Type of exercise: Other - see comments;strength training/weights;stretching(statinary bicycle), Time (Minutes): 60, Frequency (Times/Week): 1, Weekly Exercise (Minutes/Week): 60, Intensity: Mild, Exercise limited by: None identified  Goals    . DIET - INCREASE WATER INTAKE     Recommend increasing water intake to 4-6 glasses a day.     Marland Kitchen  HEMOGLOBIN A1C < 7.0       Fall Risk Fall Risk  01/29/2018 01/28/2017 01/24/2016 03/06/2015  Falls in the past year? No No No No   Is the patient's home free of loose throw rugs in walkways, pet beds, electrical cords, etc?   yes      Grab bars in the bathroom? yes       Handrails on the stairs?   yes      Adequate lighting?   yes  Timed Get Up and Go performed: N/A  Depression Screen PHQ 2/9 Scores 01/29/2018 01/28/2017 01/24/2016 03/06/2015  PHQ - 2 Score 0 0 0 0  PHQ- 9 Score - 0 - -     Cognitive Function     6CIT Screen 01/29/2018  What Year? 0 points  What month? 0 points  What time? 0 points  Count back from 20 0 points  Months in reverse 0 points  Repeat phrase 2 points  Total Score 2    Immunization History  Administered Date(s) Administered  . Influenza, High Dose Seasonal PF 07/14/2014, 06/12/2015, 06/28/2017  . Pneumococcal Conjugate-13 12/07/2014  . Pneumococcal Polysaccharide-23 09/26/2011  . Tdap 02/10/2006    Qualifies for Shingles Vaccine? Due for Shingles vaccine. Declined my offer to administer today. Education has been provided regarding the importance of this vaccine. Pt has been advised to call her insurance company to determine her out of pocket expense. Advised she may also receive this vaccine at her local pharmacy or Health  Dept. Verbalized acceptance and understanding.  Screening Tests Health Maintenance  Topic Date Due  . TETANUS/TDAP  02/11/2016  . HEMOGLOBIN A1C  07/31/2017  . FOOT EXAM  01/28/2018  . INFLUENZA VACCINE  04/23/2018  . OPHTHALMOLOGY EXAM  12/04/2018  . MAMMOGRAM  02/19/2019  . COLONOSCOPY  01/31/2025  . DEXA SCAN  Completed  . Hepatitis C Screening  Completed  . PNA vac Low Risk Adult  Completed    Cancer Screenings: Lung: Low Dose CT Chest recommended if Age 12-80 years, 30 pack-year currently smoking OR have quit w/in 15years. Patient does not qualify. Breast:  Up to date on Mammogram? Yes   Up to date of Bone Density/Dexa? Yes Colorectal: Up to date  Additional Screenings:  Hepatitis C Screening: Up to date     Plan:  I have personally reviewed and addressed the Medicare Annual Wellness questionnaire and have noted the following in the patient's chart:  A. Medical and social history B. Use of alcohol, tobacco or illicit drugs  C. Current medications and supplements D. Functional ability and status E.  Nutritional status F.  Physical activity G. Advance directives H. List of other physicians I.  Hospitalizations, surgeries, and ER visits in previous 12 months J.  [place] such as hearing and vision if needed, cognitive and depression L. Referrals and appointments - none  In addition, I have reviewed and discussed with patient certain preventive protocols, quality metrics, and best practice recommendations. A written personalized care plan for preventive services as well as general preventive health recommendations were provided to patient.  See attached scanned questionnaire for additional information.   Signed,  Fabio Neighbors, LPN Nurse Health Advisor   Nurse Recommendations: Pt needs her Hgb A1c checked today. Pt declined the tetanus vaccine today.

## 2018-01-29 NOTE — Progress Notes (Signed)
Patient: Dawn Roberts, Female    DOB: 1945/11/23, 72 y.o.   MRN: [id number] Visit Date: 01/29/2018  Today's Provider: Mar Daring, PA-C   Chief Complaint  Patient presents with  . Annual Exam   Subjective:     Complete Physical Dawn Roberts is a 72 y.o. female. She feels well. She reports exercising active with daily activities. She reports she is sleeping well.  01/28/17 CPE 09/28/12 Pap-negative; HPV-negative 02/18/17 Mammogram-BI-RADS 1 02/01/15 Colonoscopy-Diverticulosis 12/21/14 BMD-normal -----------------------------------------------------------   Review of Systems  Constitutional: Negative.   HENT: Positive for congestion and sneezing.   Eyes: Negative.   Respiratory: Positive for cough.   Cardiovascular: Negative.   Gastrointestinal: Negative.   Endocrine: Negative.   Genitourinary: Negative.   Musculoskeletal: Positive for back pain.  Skin: Negative.   Allergic/Immunologic: Positive for environmental allergies.  Neurological: Negative.   Hematological: Negative.   Psychiatric/Behavioral: Negative.     Social History   Socioeconomic History  . Marital status: Divorced    Spouse name: Not on file  . Number of children: 4  . Years of education: HS  . Highest education level: 12th grade  Occupational History  . Occupation: retired  Scientific laboratory technician  . Financial resource strain: Not hard at all  . Food insecurity:    Worry: Never true    Inability: Never true  . Transportation needs:    Medical: No    Non-medical: No  Tobacco Use  . Smoking status: Never Smoker  . Smokeless tobacco: Never Used  Substance and Sexual Activity  . Alcohol use: No    Alcohol/week: 0.0 oz  . Drug use: No  . Sexual activity: Yes    Birth control/protection: Post-menopausal    Comment: pt states, "I have a friend"  Lifestyle  . Physical activity:    Days per week: Not on file    Minutes per session: Not on file  . Stress: Not at all    Relationships  . Social connections:    Talks on phone: Not on file    Gets together: Not on file    Attends religious service: Not on file    Active member of club or organization: Not on file    Attends meetings of clubs or organizations: Not on file    Relationship status: Not on file  . Intimate partner violence:    Fear of current or ex partner: Not on file    Emotionally abused: Not on file    Physically abused: Not on file    Forced sexual activity: Not on file  Other Topics Concern  . Not on file  Social History Narrative  . Not on file    Past Medical History:  Diagnosis Date  . Allergy   . Anemia   . Arthritis   . Cardiomegaly   . CHF (congestive heart failure) ([place])   . Hyperlipidemia   . Hypertension   . Sleep apnea    MILD     Patient Active Problem List   Diagnosis Date Noted  . Genetic testing 08/17/2017  . Diabetes mellitus with no complication ([place]) 03/54/6568  . Bradycardia 03/07/2015  . Allergic rhinitis 01/18/2015  . Body mass index (BMI) of 23.0-23.9 in adult 01/18/2015  . Abnormal liver enzymes 01/18/2015  . Chronic systolic heart failure ([place]) 09/06/2014  . Cardiomyopathy ([place]) 05/02/2014  . Essential (primary) hypertension 05/02/2014  . Apnea, sleep 05/02/2014  . Combined fat and carbohydrate induced hyperlipemia 05/02/2014  .  Absolute anemia 04/15/2008  . Cannot sleep 06/12/2007  . Abnormal ECG 02/19/2007    Past Surgical History:  Procedure Laterality Date  . BREAST BIOPSY Right 1976   neg  . BREAST MASS EXCISION Right 1976   benign  . CATARACT EXTRACTION W/PHACO Left 07/29/2017   Procedure: CATARACT EXTRACTION PHACO AND INTRAOCULAR LENS PLACEMENT (IOC)-LEFT;  Surgeon: Birder Robson, MD;  Location: [facility] ORS;  Service: Ophthalmology;  Laterality: Left;  [place] 00:49 AP% 15.6 CDE 7.78 Fluid pack lot # [id number]  . CATARACT EXTRACTION W/PHACO Right 09/02/2017   Procedure: CATARACT EXTRACTION PHACO AND INTRAOCULAR LENS PLACEMENT  ([place]);  Surgeon: Birder Robson, MD;  Location: [facility] ORS;  Service: Ophthalmology;  Laterality: Right;  [place] 00:58.7 AP% 18.0 CDE 10.58 Fluid Pack lot # [id number]  . COLONOSCOPY  03-29-05   Dr [name]  . COLONOSCOPY N/A 02/01/2015   Procedure: COLONOSCOPY;  Surgeon: [name], MD;  Location: [facility] ENDOSCOPY;  Service: Endoscopy;  Laterality: N/A;  . venous closure procedure Right 07-25-05   Dr. [name]    Her family history includes Breast cancer in her maternal aunt; Breast cancer (age of onset: 78) in her sister; Diabetes in her father; Healthy in her brother, sister, and sister; Ovarian cancer in her sister.      Current Outpatient Medications:  .  aspirin EC 81 MG tablet, Take 81 mg by mouth daily with supper., Disp: , Rfl:  .  Bioflavonoid Products (VITAMIN C) CHEW, Chew 1 tablet by mouth 3 (three) times a week., Disp: , Rfl:  .  Calcium Carb-Cholecalciferol (CALCIUM + D3 PO), Take 1 tablet by mouth 2 (two) times daily., Disp: , Rfl:  .  carvedilol (COREG) 6.25 MG tablet, Take 6.25 mg by mouth 2 (two) times daily., Disp: , Rfl: 5 .  losartan (COZAAR) 50 MG tablet, Take 50 mg by mouth daily. , Disp: , Rfl:  .  Misc Natural Products (OSTEO BI-FLEX ADV JOINT SHIELD PO), Take 1 tablet by mouth 2 (two) times daily. , Disp: , Rfl:  .  Multiple Vitamin (MULTIVITAMIN WITH MINERALS) TABS tablet, Take 1 tablet by mouth daily with lunch., Disp: , Rfl:  .  spironolactone (ALDACTONE) 25 MG tablet, Take 25 mg by mouth daily., Disp: , Rfl: 5 .  tretinoin (RETIN-A) 0.1 % cream, APPLY 1 APPLICATION ON THE SKIN AT BEDTIME EVERY OTHER NIGHT., Disp: , Rfl: 6 .  vitamin B-12 (CYANOCOBALAMIN) 1000 MCG tablet, Take 1,000 mcg by mouth daily., Disp: , Rfl:   Patient Care Team: Mar Daring, PA-C as PCP - General (Family Medicine) Corey Skains, MD as Consulting Physician (Cardiology) [name], MD as Consulting Physician (Ophthalmology) Birder Robson, MD as Referring Physician  (Ophthalmology)     Objective:   Vitals: BP 124/62 (BP Location: Right Arm, Patient Position: Sitting, Cuff Size: Normal)   Pulse (!) 57   Temp 98.8 F (37.1 C) (Oral)   Resp 16   Ht 5\' 5"  (1.651 m)   Wt 142 lb (64.4 kg)   BMI 23.63 kg/m   Physical Exam  Constitutional: She is oriented to person, place, and time. She appears well-developed and well-nourished. No distress.  HENT:  Head: Normocephalic and atraumatic.  Right Ear: Hearing, tympanic membrane, external ear and ear canal normal.  Left Ear: Hearing, tympanic membrane, external ear and ear canal normal.  Nose: Nose normal.  Mouth/Throat: Uvula is midline, oropharynx is clear and moist and mucous membranes are normal. No oropharyngeal exudate.  Eyes: Pupils are equal,  round, and reactive to light. Conjunctivae and EOM are normal. Right eye exhibits no discharge. Left eye exhibits no discharge. No scleral icterus.  Neck: Normal range of motion. Neck supple. No JVD present. Carotid bruit is not present. No tracheal deviation present. No thyromegaly present.  Cardiovascular: Normal rate, regular rhythm, normal heart sounds and intact distal pulses. Exam reveals no gallop and no friction rub.  No murmur heard. Pulmonary/Chest: Effort normal and breath sounds normal. No respiratory distress. She has no wheezes. She has no rales. She exhibits no tenderness. Right breast exhibits no inverted nipple, no mass, no nipple discharge, no skin change and no tenderness. Left breast exhibits no inverted nipple, no mass, no nipple discharge, no skin change and no tenderness. No breast tenderness, discharge or bleeding. Breasts are symmetrical.  Abdominal: Soft. Bowel sounds are normal. She exhibits no distension and no mass. There is no tenderness. There is no rebound and no guarding. Hernia confirmed negative in the right inguinal area and confirmed negative in the left inguinal area.  Genitourinary: Rectum normal, vagina normal and uterus  normal. No breast tenderness, discharge or bleeding. Pelvic exam was performed with patient supine. There is no rash, tenderness, lesion or injury on the right labia. There is no rash, tenderness, lesion or injury on the left labia. Cervix exhibits no motion tenderness, no discharge and no friability. Right adnexum displays no mass, no tenderness and no fullness. Left adnexum displays no mass, no tenderness and no fullness. No erythema, tenderness or bleeding in the vagina. No signs of injury around the vagina. No vaginal discharge found.  Musculoskeletal: Normal range of motion. She exhibits no edema or tenderness.  Lymphadenopathy:    She has no cervical adenopathy.       Right: No inguinal adenopathy present.       Left: No inguinal adenopathy present.  Neurological: She is alert and oriented to person, place, and time. She has normal reflexes. No cranial nerve deficit. Coordination normal.  Skin: Skin is warm and dry. No rash noted. She is not diaphoretic.  Psychiatric: She has a normal mood and affect. Her behavior is normal. Judgment and thought content normal.  Vitals reviewed.   Activities of Daily Living In your present state of health, do you have any difficulty performing the following activities: 01/29/2018  Hearing? N  Vision? N  Difficulty concentrating or making decisions? N  Walking or climbing stairs? N  Dressing or bathing? N  Doing errands, shopping? N  Preparing Food and eating ? N  Using the Toilet? N  In the past six months, have you accidently leaked urine? N  Do you have problems with loss of bowel control? N  Managing your Medications? N  Managing your Finances? N  Housekeeping or managing your Housekeeping? N  Some recent data might be hidden    Fall Risk Assessment Fall Risk  01/29/2018 01/28/2017 01/24/2016 03/06/2015  Falls in the past year? No No No No     Depression Screen PHQ 2/9 Scores 01/29/2018 01/28/2017 01/24/2016 03/06/2015  PHQ - 2 Score 0 0 0 0  PHQ- 9  Score - 0 - -    Cognitive Testing - 6-CIT  Correct? Score   What year is it? yes 0 0 or 4  What month is it? yes 0 0 or 3  Memorize:    Pia Mau,  41,  [place],      What time is it? (within 1 hour) yes 0 0 or  3  Count backwards from 20 yes 0 0, 2, or 4  Name the months of the year yes 0 0, 2, or 4  Repeat name & address above no 2 0, 2, 4, 6, 8, or 10       TOTAL SCORE  2/28   Interpretation:  Normal  Normal (0-7) Abnormal (8-28)       Assessment & Plan:    Annual Physical Reviewed patient's Family Medical History Reviewed and updated list of patient's medical providers Assessment of cognitive impairment was done Assessed patient's functional ability Established a written schedule for health screening [place] Completed and Reviewed  Exercise Activities and Dietary recommendations Goals    . DIET - INCREASE WATER INTAKE     Recommend increasing water intake to 4-6 glasses a day.     Marland Kitchen HEMOGLOBIN A1C < 7.0       Immunization History  Administered Date(s) Administered  . Influenza, High Dose Seasonal PF 07/14/2014, 06/12/2015, 06/28/2017  . Pneumococcal Conjugate-13 12/07/2014  . Pneumococcal Polysaccharide-23 09/26/2011  . Tdap 02/10/2006    Health Maintenance  Topic Date Due  . Samul Dada  02/11/2016  . HEMOGLOBIN A1C  07/31/2017  . FOOT EXAM  01/28/2018  . INFLUENZA VACCINE  04/23/2018  . OPHTHALMOLOGY EXAM  12/04/2018  . MAMMOGRAM  02/19/2019  . COLONOSCOPY  01/31/2025  . DEXA SCAN  Completed  . Hepatitis C Screening  Completed  . PNA vac Low Risk Adult  Completed    Discussed health benefits of physical activity, and encouraged her to engage in regular exercise appropriate for her age and condition.    1. Annual physical exam Normal physical exam today. Will check labs as below and f/u pending lab results. If labs are stable and WNL she will not need to have these rechecked for one year at her next annual  physical exam. She is to call the office in the meantime if she has any acute issue, questions or concerns.  2. Breast cancer screening Breast exam today was normal. There is family history of breast cancer. She does perform regular self breast exams. Mammogram was ordered as below. Information for [facility] clinic was given to patient so she may schedule her mammogram at her convenience. - MM Digital Screening; Future  3. Family history of breast cancer in first degree relative Sister - MM Digital Screening; Future  4. Cervical cancer screening Pap collected today. Will send as below and f/u pending results. - Pap IG and HPV (high risk) DNA detection  5. Diabetes mellitus with no complication (HCC) Stable. Diet controlled. Will check labs as below and f/u pending results. - TSH - Hemoglobin A1c  6. Essential (primary) hypertension Stable. Continue Spironolactone 25mg , losartan 50mg , carvedilol 6.25mg . Followed by Dr Nehemiah Massed. Will check labs as below and f/u pending results. - Comprehensive metabolic panel - TSH  7. Combined fat and carbohydrate induced hyperlipemia Stable. Diet controlled. Will check labs as below and f/u pending results. - Lipid Panel With LDL/HDL Ratio - TSH  8. Anemia, unspecified type H/O this. Will check labs as below and f/u pending results. - CBC with Differential/Platelet  ------------------------------------------------------------------------------------------------------------    Mar Daring, PA-C  [facility]

## 2018-01-29 NOTE — Patient Instructions (Signed)
Dawn Roberts , Thank you for taking time to come for your Medicare Wellness Visit. I appreciate your ongoing commitment to your health goals. Please review the following plan we discussed and let me know if I can assist you in the future.   Screening recommendations/referrals: Colonoscopy: Up to date Mammogram: Up to date Bone Density: Up to date Recommended yearly ophthalmology/optometry visit for glaucoma screening and checkup Recommended yearly dental visit for hygiene and checkup  Vaccinations: Influenza vaccine: Up to date Pneumococcal vaccine: Up to date Tdap vaccine: Pt declines today.  Shingles vaccine: Pt declines today.     Advanced directives: Advance directive discussed with you today. Even though you declined this today please call our office should you change your mind and we can give you the proper paperwork for you to fill out.  Conditions/risks identified: Recommend increasing water intake to 4-6 glasses a day.   Next appointment: 9:00 AM today with Fenton Malling.   Preventive Care 73 Years and Older, Female Preventive care refers to lifestyle choices and visits with your health care provider that can promote health and wellness. What does preventive care include?  A yearly physical exam. This is also called an annual well check.  Dental exams once or twice a year.  Routine eye exams. Ask your health care provider how often you should have your eyes checked.  Personal lifestyle choices, including:  Daily care of your teeth and gums.  Regular physical activity.  Eating a healthy diet.  Avoiding tobacco and drug use.  Limiting alcohol use.  Practicing safe sex.  Taking low-dose aspirin every day.  Taking vitamin and mineral supplements as recommended by your health care provider. What happens during an annual well check? The services and screenings done by your health care provider during your annual well check will depend on your age, overall  health, lifestyle risk factors, and family history of disease. Counseling  Your health care provider may ask you questions about your:  Alcohol use.  Tobacco use.  Drug use.  Emotional well-being.  Home and relationship well-being.  Sexual activity.  Eating habits.  History of falls.  Memory and ability to understand (cognition).  Work and work Statistician.  Reproductive health. Screening  You may have the following tests or measurements:  Height, weight, and BMI.  Blood pressure.  Lipid and cholesterol levels. These may be checked every 5 years, or more frequently if you are over 67 years old.  Skin check.  Lung cancer screening. You may have this screening every year starting at age 73 if you have a 30-pack-year history of smoking and currently smoke or have quit within the past 15 years.  Fecal occult blood test (FOBT) of the stool. You may have this test every year starting at age 73.  Flexible sigmoidoscopy or colonoscopy. You may have a sigmoidoscopy every 5 years or a colonoscopy every 10 years starting at age 73.  Hepatitis C blood test.  Hepatitis B blood test.  Sexually transmitted disease (STD) testing.  Diabetes screening. This is done by checking your blood sugar (glucose) after you have not eaten for a while (fasting). You may have this done every 1-3 years.  Bone density scan. This is done to screen for osteoporosis. You may have this done starting at age 73.  Mammogram. This may be done every 1-2 years. Talk to your health care provider about how often you should have regular mammograms. Talk with your health care provider about your test results, treatment options,  and if necessary, the need for more tests. Vaccines  Your health care provider may recommend certain vaccines, such as:  Influenza vaccine. This is recommended every year.  Tetanus, diphtheria, and acellular pertussis (Tdap, Td) vaccine. You may need a Td booster every 10  years.  Zoster vaccine. You may need this after age 73.  Pneumococcal 13-valent conjugate (PCV13) vaccine. One dose is recommended after age 73.  Pneumococcal polysaccharide (PPSV23) vaccine. One dose is recommended after age 73. Talk to your health care provider about which screenings and vaccines you need and how often you need them. This information is not intended to replace advice given to you by your health care provider. Make sure you discuss any questions you have with your health care provider. Document Released: 10/06/2015 Document Revised: 05/29/2016 Document Reviewed: 07/11/2015 Elsevier Interactive Patient Education  2017 [place] Prevention in the Home Falls can cause injuries. They can happen to people of all ages. There are many things you can do to make your home safe and to help prevent falls. What can I do on the outside of my home?  Regularly fix the edges of walkways and driveways and fix any cracks.  Remove anything that might make you trip as you walk through a door, such as a raised step or threshold.  Trim any bushes or trees on the path to your home.  Use bright outdoor lighting.  Clear any walking paths of anything that might make someone trip, such as rocks or tools.  Regularly check to see if handrails are loose or broken. Make sure that both sides of any steps have handrails.  Any raised decks and porches should have guardrails on the edges.  Have any leaves, snow, or ice cleared regularly.  Use sand or salt on walking paths during winter.  Clean up any spills in your garage right away. This includes oil or grease spills. What can I do in the bathroom?  Use night lights.  Install grab bars by the toilet and in the tub and shower. Do not use towel bars as grab bars.  Use non-skid mats or decals in the tub or shower.  If you need to sit down in the shower, use a plastic, non-slip stool.  Keep the floor dry. Clean up any water that  spills on the floor as soon as it happens.  Remove soap buildup in the tub or shower regularly.  Attach bath mats securely with double-sided non-slip rug tape.  Do not have throw rugs and other things on the floor that can make you trip. What can I do in the bedroom?  Use night lights.  Make sure that you have a light by your bed that is easy to reach.  Do not use any sheets or blankets that are too big for your bed. They should not hang down onto the floor.  Have a firm chair that has side arms. You can use this for support while you get dressed.  Do not have throw rugs and other things on the floor that can make you trip. What can I do in the kitchen?  Clean up any spills right away.  Avoid walking on wet floors.  Keep items that you use a lot in easy-to-reach places.  If you need to reach something above you, use a strong step stool that has a grab bar.  Keep electrical cords out of the way.  Do not use floor polish or wax that makes floors slippery. If  you must use wax, use non-skid floor wax.  Do not have throw rugs and other things on the floor that can make you trip. What can I do with my stairs?  Do not leave any items on the stairs.  Make sure that there are handrails on both sides of the stairs and use them. Fix handrails that are broken or loose. Make sure that handrails are as long as the stairways.  Check any carpeting to make sure that it is firmly attached to the stairs. Fix any carpet that is loose or worn.  Avoid having throw rugs at the top or bottom of the stairs. If you do have throw rugs, attach them to the floor with carpet tape.  Make sure that you have a light switch at the top of the stairs and the bottom of the stairs. If you do not have them, ask someone to add them for you. What else can I do to help prevent falls?  Wear shoes that:  Do not have high heels.  Have rubber bottoms.  Are comfortable and fit you well.  Are closed at the  toe. Do not wear sandals.  If you use a stepladder:  Make sure that it is fully opened. Do not climb a closed stepladder.  Make sure that both sides of the stepladder are locked into place.  Ask someone to hold it for you, if possible.  Clearly mark and make sure that you can see:  Any grab bars or handrails.  First and last steps.  Where the edge of each step is.  Use tools that help you move around (mobility aids) if they are needed. These include:  Canes.  Walkers.  Scooters.  Crutches.  Turn on the lights when you go into a dark area. Replace any light bulbs as soon as they burn out.  Set up your furniture so you have a clear path. Avoid moving your furniture around.  If any of your floors are uneven, fix them.  If there are any pets around you, be aware of where they are.  Review your medicines with your doctor. Some medicines can make you feel dizzy. This can increase your chance of falling. Ask your doctor what other things that you can do to help prevent falls. This information is not intended to replace advice given to you by your health care provider. Make sure you discuss any questions you have with your health care provider. Document Released: 07/06/2009 Document Revised: 02/15/2016 Document Reviewed: 10/14/2014 Elsevier Interactive Patient Education  2017 [employer].

## 2018-01-29 NOTE — Patient Instructions (Signed)
Health Maintenance for Postmenopausal Women Menopause is a normal process in which your reproductive ability comes to an end. This process happens gradually over a span of months to years, usually between the ages of 22 and 9. Menopause is complete when you have missed 12 consecutive menstrual periods. It is important to talk with your health care provider about some of the most common conditions that affect postmenopausal women, such as heart disease, cancer, and bone loss (osteoporosis). Adopting a healthy lifestyle and getting preventive care can help to promote your health and wellness. Those actions can also lower your chances of developing some of these common conditions. What should I know about menopause? During menopause, you may experience a number of symptoms, such as:  Moderate-to-severe hot flashes.  Night sweats.  Decrease in sex drive.  Mood swings.  Headaches.  Tiredness.  Irritability.  Memory problems.  Insomnia.  Choosing to treat or not to treat menopausal changes is an individual decision that you make with your health care provider. What should I know about hormone replacement therapy and supplements? Hormone therapy products are effective for treating symptoms that are associated with menopause, such as hot flashes and night sweats. Hormone replacement carries certain risks, especially as you become older. If you are thinking about using estrogen or estrogen with progestin treatments, discuss the benefits and risks with your health care provider. What should I know about heart disease and stroke? Heart disease, heart attack, and stroke become more likely as you age. This may be due, in part, to the hormonal changes that your body experiences during menopause. These can affect how your body processes dietary fats, triglycerides, and cholesterol. Heart attack and stroke are both medical emergencies. There are many things that you can do to help prevent heart disease  and stroke:  Have your blood pressure checked at least every 1-2 years. High blood pressure causes heart disease and increases the risk of stroke.  If you are 53-22 years old, ask your health care provider if you should take aspirin to prevent a heart attack or a stroke.  Do not use any tobacco products, including cigarettes, chewing tobacco, or electronic cigarettes. If you need help quitting, ask your health care provider.  It is important to eat a healthy diet and maintain a healthy weight. ? Be sure to include plenty of vegetables, fruits, low-fat dairy products, and lean protein. ? Avoid eating foods that are high in solid fats, added sugars, or salt (sodium).  Get regular exercise. This is one of the most important things that you can do for your health. ? Try to exercise for at least 150 minutes each week. The type of exercise that you do should increase your heart rate and make you sweat. This is known as moderate-intensity exercise. ? Try to do strengthening exercises at least twice each week. Do these in addition to the moderate-intensity exercise.  Know your numbers.Ask your health care provider to check your cholesterol and your blood glucose. Continue to have your blood tested as directed by your health care provider.  What should I know about cancer screening? There are several types of cancer. Take the following steps to reduce your risk and to catch any cancer development as early as possible. Breast Cancer  Practice breast self-awareness. ? This means understanding how your breasts normally appear and feel. ? It also means doing regular breast self-exams. Let your health care provider know about any changes, no matter how small.  If you are 40  or older, have a clinician do a breast exam (clinical breast exam or CBE) every year. Depending on your age, family history, and medical history, it may be recommended that you also have a yearly breast X-ray (mammogram).  If you  have a family history of breast cancer, talk with your health care provider about genetic screening.  If you are at high risk for breast cancer, talk with your health care provider about having an MRI and a mammogram every year.  Breast cancer (BRCA) gene test is recommended for women who have family members with BRCA-related cancers. Results of the assessment will determine the need for genetic counseling and BRCA1 and for BRCA2 testing. BRCA-related cancers include these types: ? Breast. This occurs in males or females. ? Ovarian. ? Tubal. This may also be called fallopian tube cancer. ? Cancer of the abdominal or pelvic lining (peritoneal cancer). ? Prostate. ? Pancreatic.  Cervical, Uterine, and Ovarian Cancer Your health care provider may recommend that you be screened regularly for cancer of the pelvic organs. These include your ovaries, uterus, and vagina. This screening involves a pelvic exam, which includes checking for microscopic changes to the surface of your cervix (Pap test).  For women ages 21-65, health care providers may recommend a pelvic exam and a Pap test every three years. For women ages 79-65, they may recommend the Pap test and pelvic exam, combined with testing for human papilloma virus (HPV), every five years. Some types of HPV increase your risk of cervical cancer. Testing for HPV may also be done on women of any age who have unclear Pap test results.  Other health care providers may not recommend any screening for nonpregnant women who are considered low risk for pelvic cancer and have no symptoms. Ask your health care provider if a screening pelvic exam is right for you.  If you have had past treatment for cervical cancer or a condition that could lead to cancer, you need Pap tests and screening for cancer for at least 20 years after your treatment. If Pap tests have been discontinued for you, your risk factors (such as having a new sexual partner) need to be  reassessed to determine if you should start having screenings again. Some women have medical problems that increase the chance of getting cervical cancer. In these cases, your health care provider may recommend that you have screening and Pap tests more often.  If you have a family history of uterine cancer or ovarian cancer, talk with your health care provider about genetic screening.  If you have vaginal bleeding after reaching menopause, tell your health care provider.  There are currently no reliable tests available to screen for ovarian cancer.  Lung Cancer Lung cancer screening is recommended for adults 69-62 years old who are at high risk for lung cancer because of a history of smoking. A yearly low-dose CT scan of the lungs is recommended if you:  Currently smoke.  Have a history of at least 30 pack-years of smoking and you currently smoke or have quit within the past 15 years. A pack-year is smoking an average of one pack of cigarettes per day for one year.  Yearly screening should:  Continue until it has been 15 years since you quit.  Stop if you develop a health problem that would prevent you from having lung cancer treatment.  Colorectal Cancer  This type of cancer can be detected and can often be prevented.  Routine colorectal cancer screening usually begins at  age 42 and continues through age 45.  If you have risk factors for colon cancer, your health care provider may recommend that you be screened at an earlier age.  If you have a family history of colorectal cancer, talk with your health care provider about genetic screening.  Your health care provider may also recommend using home test kits to check for hidden blood in your stool.  A small camera at the end of a tube can be used to examine your colon directly (sigmoidoscopy or colonoscopy). This is done to check for the earliest forms of colorectal cancer.  Direct examination of the colon should be repeated every  5-10 years until age 71. However, if early forms of precancerous polyps or small growths are found or if you have a family history or genetic risk for colorectal cancer, you may need to be screened more often.  Skin Cancer  Check your skin from head to toe regularly.  Monitor any moles. Be sure to tell your health care provider: ? About any new moles or changes in moles, especially if there is a change in a mole's shape or color. ? If you have a mole that is larger than the size of a pencil eraser.  If any of your family members has a history of skin cancer, especially at a young age, talk with your health care provider about genetic screening.  Always use sunscreen. Apply sunscreen liberally and repeatedly throughout the day.  Whenever you are outside, protect yourself by wearing long sleeves, pants, a wide-brimmed hat, and sunglasses.  What should I know about osteoporosis? Osteoporosis is a condition in which bone destruction happens more quickly than new bone creation. After menopause, you may be at an increased risk for osteoporosis. To help prevent osteoporosis or the bone fractures that can happen because of osteoporosis, the following is recommended:  If you are 46-71 years old, get at least 1,000 mg of calcium and at least 600 mg of vitamin D per day.  If you are older than age 55 but younger than age 65, get at least 1,200 mg of calcium and at least 600 mg of vitamin D per day.  If you are older than age 54, get at least 1,200 mg of calcium and at least 800 mg of vitamin D per day.  Smoking and excessive alcohol intake increase the risk of osteoporosis. Eat foods that are rich in calcium and vitamin D, and do weight-bearing exercises several times each week as directed by your health care provider. What should I know about how menopause affects my mental health? Depression may occur at any age, but it is more common as you become older. Common symptoms of depression  include:  Low or sad mood.  Changes in sleep patterns.  Changes in appetite or eating patterns.  Feeling an overall lack of motivation or enjoyment of activities that you previously enjoyed.  Frequent crying spells.  Talk with your health care provider if you think that you are experiencing depression. What should I know about immunizations? It is important that you get and maintain your immunizations. These include:  Tetanus, diphtheria, and pertussis (Tdap) booster vaccine.  Influenza every year before the flu season begins.  Pneumonia vaccine.  Shingles vaccine.  Your health care provider may also recommend other immunizations. This information is not intended to replace advice given to you by your health care provider. Make sure you discuss any questions you have with your health care provider. Document Released: 11/01/2005  Document Revised: 03/29/2016 Document Reviewed: 06/13/2015 Elsevier Interactive Patient Education  2018 [employer]  

## 2018-01-30 ENCOUNTER — Telehealth: Payer: Self-pay

## 2018-01-30 LAB — LIPID PANEL WITH LDL/HDL RATIO
Cholesterol, Total: 153 mg/dL (ref 100–199)
HDL: 47 mg/dL (ref >39)
LDL CALC: 94 mg/dL (ref 0–99)
LDL/HDL RATIO: 2 ratio (ref 0.0–3.2)
Triglycerides: 59 mg/dL (ref 0–149)
VLDL CHOLESTEROL CAL: 12 mg/dL (ref 5–40)

## 2018-01-30 LAB — CBC WITH DIFFERENTIAL/PLATELET
BASOS ABS: 0.1 10*3/uL (ref 0.0–0.2)
Basos: 1 %
EOS (ABSOLUTE): 0.1 10*3/uL (ref 0.0–0.4)
Eos: 2 %
HEMATOCRIT: 35.3 % (ref 34.0–46.6)
Hemoglobin: 11.5 g/dL (ref 11.1–15.9)
Immature Grans (Abs): 0 10*3/uL (ref 0.0–0.1)
Immature Granulocytes: 0 %
LYMPHS ABS: 2.2 10*3/uL (ref 0.7–3.1)
Lymphs: 41 %
MCH: 29.3 pg (ref 26.6–33.0)
MCHC: 32.6 g/dL (ref 31.5–35.7)
MCV: 90 fL (ref 79–97)
Monocytes Absolute: 0.3 10*3/uL (ref 0.1–0.9)
Monocytes: 6 %
NEUTROS ABS: 2.6 10*3/uL (ref 1.4–7.0)
Neutrophils: 50 %
Platelets: 288 10*3/uL (ref 150–379)
RBC: 3.93 x10E6/uL (ref 3.77–5.28)
RDW: 13.3 % (ref 12.3–15.4)
WBC: 5.2 10*3/uL (ref 3.4–10.8)

## 2018-01-30 LAB — COMPREHENSIVE METABOLIC PANEL
A/G RATIO: 2 (ref 1.2–2.2)
ALT: 19 IU/L (ref 0–32)
AST: 21 IU/L (ref 0–40)
Albumin: 4.5 g/dL (ref 3.5–4.8)
Alkaline Phosphatase: 39 IU/L (ref 39–117)
BUN/Creatinine Ratio: 21 (ref 12–28)
BUN: 21 mg/dL (ref 8–27)
Bilirubin Total: 0.3 mg/dL (ref 0.0–1.2)
CALCIUM: 10 mg/dL (ref 8.7–10.3)
CO2: 23 mmol/L (ref 20–29)
CREATININE: 1 mg/dL (ref 0.57–1.00)
Chloride: 104 mmol/L (ref 96–106)
GFR, EST AFRICAN AMERICAN: 65 mL/min/{1.73_m2} (ref >59)
GFR, EST NON AFRICAN AMERICAN: 56 mL/min/{1.73_m2} — AB (ref >59)
Globulin, Total: 2.2 g/dL (ref 1.5–4.5)
Glucose: 99 mg/dL (ref 65–99)
Potassium: 4.2 mmol/L (ref 3.5–5.2)
Sodium: 139 mmol/L (ref 134–144)
TOTAL PROTEIN: 6.7 g/dL (ref 6.0–8.5)

## 2018-01-30 LAB — HEMOGLOBIN A1C
Est. average glucose Bld gHb Est-mCnc: 128 mg/dL
Hgb A1c MFr Bld: 6.1 % — ABNORMAL HIGH (ref 4.8–5.6)

## 2018-01-30 LAB — TSH: TSH: 1.45 u[IU]/mL (ref 0.450–4.500)

## 2018-01-30 NOTE — Telephone Encounter (Signed)
Patient advised as below.  

## 2018-01-30 NOTE — Telephone Encounter (Signed)
-----   Message from Mar Daring, PA-C sent at 01/30/2018  8:23 AM EDT ----- A1c good at 6.1, down from 6.3. Thyroid normal. Cholesterol normal. Kidney and liver function normal. Blood count normal.

## 2018-01-31 LAB — PAP IG AND HPV HIGH-RISK
HPV, HIGH-RISK: NEGATIVE
PAP SMEAR COMMENT: 0

## 2018-02-03 ENCOUNTER — Telehealth: Payer: Self-pay

## 2018-02-03 NOTE — Telephone Encounter (Signed)
-----   Message from Mar Daring, [place] sent at 02/02/2018  2:19 PM EDT ----- Pap is normal, HPV negative.  Will repeat in 3-5 years.

## 2018-02-03 NOTE — Telephone Encounter (Signed)
Patient advised as below.  

## 2018-02-19 ENCOUNTER — Ambulatory Visit
Admission: RE | Admit: 2018-02-19 | Discharge: 2018-02-19 | Disposition: A | Discharge status: Home / Self Care | Payer: PPO | Source: Ambulatory Visit | Attending: Physician Assistant | Admitting: Physician Assistant

## 2018-02-19 DIAGNOSIS — Z1231 Encounter for screening mammogram for malignant neoplasm of breast: Secondary | ICD-10-CM | POA: Insufficient documentation

## 2018-04-02 DIAGNOSIS — H3554 Dystrophies primarily involving the retinal pigment epithelium: Secondary | ICD-10-CM | POA: Diagnosis not present

## 2018-04-22 DIAGNOSIS — I10 Essential (primary) hypertension: Secondary | ICD-10-CM | POA: Diagnosis not present

## 2018-04-22 DIAGNOSIS — E782 Mixed hyperlipidemia: Secondary | ICD-10-CM | POA: Diagnosis not present

## 2018-04-22 DIAGNOSIS — I5022 Chronic systolic (congestive) heart failure: Secondary | ICD-10-CM | POA: Diagnosis not present

## 2018-04-22 DIAGNOSIS — I420 Dilated cardiomyopathy: Secondary | ICD-10-CM | POA: Diagnosis not present

## 2018-06-23 DIAGNOSIS — D1800 Hemangioma unspecified site: Secondary | ICD-10-CM | POA: Diagnosis not present

## 2018-06-23 DIAGNOSIS — L570 Actinic keratosis: Secondary | ICD-10-CM | POA: Diagnosis not present

## 2018-06-23 DIAGNOSIS — L821 Other seborrheic keratosis: Secondary | ICD-10-CM | POA: Diagnosis not present

## 2018-06-23 DIAGNOSIS — L858 Other specified epidermal thickening: Secondary | ICD-10-CM | POA: Diagnosis not present

## 2018-06-23 DIAGNOSIS — D229 Melanocytic nevi, unspecified: Secondary | ICD-10-CM | POA: Diagnosis not present

## 2018-06-23 DIAGNOSIS — I8393 Asymptomatic varicose veins of bilateral lower extremities: Secondary | ICD-10-CM | POA: Diagnosis not present

## 2018-06-23 DIAGNOSIS — Z1283 Encounter for screening for malignant neoplasm of skin: Secondary | ICD-10-CM | POA: Diagnosis not present

## 2018-06-23 DIAGNOSIS — L812 Freckles: Secondary | ICD-10-CM | POA: Diagnosis not present

## 2018-06-23 DIAGNOSIS — L720 Epidermal cyst: Secondary | ICD-10-CM | POA: Diagnosis not present

## 2018-06-23 DIAGNOSIS — D225 Melanocytic nevi of trunk: Secondary | ICD-10-CM | POA: Diagnosis not present

## 2018-08-31 DIAGNOSIS — L578 Other skin changes due to chronic exposure to nonionizing radiation: Secondary | ICD-10-CM | POA: Diagnosis not present

## 2018-08-31 DIAGNOSIS — L720 Epidermal cyst: Secondary | ICD-10-CM | POA: Diagnosis not present

## 2018-10-14 DIAGNOSIS — I5022 Chronic systolic (congestive) heart failure: Secondary | ICD-10-CM | POA: Diagnosis not present

## 2018-10-14 DIAGNOSIS — I10 Essential (primary) hypertension: Secondary | ICD-10-CM | POA: Diagnosis not present

## 2018-10-14 DIAGNOSIS — E782 Mixed hyperlipidemia: Secondary | ICD-10-CM | POA: Diagnosis not present

## 2018-11-09 DIAGNOSIS — I5022 Chronic systolic (congestive) heart failure: Secondary | ICD-10-CM | POA: Diagnosis not present

## 2018-11-17 DIAGNOSIS — I10 Essential (primary) hypertension: Secondary | ICD-10-CM | POA: Diagnosis not present

## 2018-11-17 DIAGNOSIS — E782 Mixed hyperlipidemia: Secondary | ICD-10-CM | POA: Diagnosis not present

## 2018-11-17 DIAGNOSIS — I5022 Chronic systolic (congestive) heart failure: Secondary | ICD-10-CM | POA: Diagnosis not present

## 2018-11-30 DIAGNOSIS — L578 Other skin changes due to chronic exposure to nonionizing radiation: Secondary | ICD-10-CM | POA: Diagnosis not present

## 2018-11-30 DIAGNOSIS — L570 Actinic keratosis: Secondary | ICD-10-CM | POA: Diagnosis not present

## 2018-12-29 ENCOUNTER — Telehealth: Payer: Self-pay

## 2018-12-29 NOTE — Telephone Encounter (Signed)
Patient states she is having the following symptoms:headache, congestion in chest, sorethroat and stuffy nose but no fever. Patient states she was outside on Friday and Saturday believes that is the reason for her symptoms. Patient would like something send to the pharmacy for her if possible. Patient was offered a web visit but does not have a computer or smart phone.Please advise.

## 2018-12-29 NOTE — Telephone Encounter (Signed)
Since symptoms are less than 4-5 days it is most likely allergies. Start claritin, Human resources officer or zyrtec OTC. May also benefit from Mucinex (plain or DM if she has a cough) for congestion. Using flonase can help with nasal congestion and post nasal drainage. Also saline nasal irrigations can help flush the sinus cavities.

## 2018-12-29 NOTE — Telephone Encounter (Signed)
Patient was advised.  

## 2019-01-04 ENCOUNTER — Encounter: Payer: Self-pay | Admitting: Physician Assistant

## 2019-01-04 ENCOUNTER — Other Ambulatory Visit: Payer: Self-pay

## 2019-01-04 ENCOUNTER — Ambulatory Visit (INDEPENDENT_AMBULATORY_CARE_PROVIDER_SITE_OTHER): Payer: PPO | Admitting: Physician Assistant

## 2019-01-04 VITALS — BP 117/71 | HR 69 | Temp 98.3°F | Resp 16 | Wt 147.8 lb

## 2019-01-04 DIAGNOSIS — J40 Bronchitis, not specified as acute or chronic: Secondary | ICD-10-CM | POA: Diagnosis not present

## 2019-01-04 MED ORDER — AZITHROMYCIN 250 MG PO TABS: ORAL_TABLET | ORAL | 0 refills | Status: DC

## 2019-01-04 MED ORDER — BENZONATATE 200 MG PO CAPS: 200.0000 mg | ORAL_CAPSULE | Freq: Three times a day (TID) | ORAL | 0 refills | Status: DC | PRN

## 2019-01-04 NOTE — Progress Notes (Signed)
Patient: Dawn Roberts Female    DOB: [birth year]   73 y.o.   MRN: [id number] Visit Date: 01/04/2019  Today's Provider: Mar Daring, PA-C   Chief Complaint  Patient presents with  . URI   Subjective:     URI   This is a new problem. The current episode started 1 to 4 weeks ago (last weekend with sinus infection). The problem has been gradually worsening. There has been no fever. Associated symptoms include congestion, coughing and a sore throat ("all week'). Pertinent negatives include no chest pain, ear pain, rhinorrhea, sinus pain, sneezing or wheezing. Associated symptoms comments: Eyes are real red.. She has tried antihistamine for the symptoms. The treatment provided no relief.  Patient walked in office today  No Known Allergies   Current Outpatient Medications:  .  aspirin EC 81 MG tablet, Take 81 mg by mouth daily with supper., Disp: , Rfl:  .  Bioflavonoid Products (VITAMIN C) CHEW, Chew 1 tablet by mouth 3 (three) times a week., Disp: , Rfl:  .  Calcium Carb-Cholecalciferol (CALCIUM + D3 PO), Take 1 tablet by mouth 2 (two) times daily., Disp: , Rfl:  .  carvedilol (COREG) 6.25 MG tablet, Take 6.25 mg by mouth 2 (two) times daily., Disp: , Rfl: 5 .  losartan (COZAAR) 50 MG tablet, Take 50 mg by mouth daily. , Disp: , Rfl:  .  Misc Natural Products (OSTEO BI-FLEX ADV JOINT SHIELD PO), Take 1 tablet by mouth 2 (two) times daily. , Disp: , Rfl:  .  Multiple Vitamin (MULTIVITAMIN WITH MINERALS) TABS tablet, Take 1 tablet by mouth daily with lunch., Disp: , Rfl:  .  spironolactone (ALDACTONE) 25 MG tablet, Take 25 mg by mouth daily., Disp: , Rfl: 5 .  tretinoin (RETIN-A) 0.1 % cream, APPLY 1 APPLICATION ON THE SKIN AT BEDTIME EVERY OTHER NIGHT., Disp: , Rfl: 6 .  vitamin B-12 (CYANOCOBALAMIN) 1000 MCG tablet, Take 1,000 mcg by mouth daily., Disp: , Rfl:   Review of Systems  Constitutional: Negative for chills, fatigue and fever.  HENT: Positive for congestion,  postnasal drip, sinus pressure and sore throat ("all week'). Negative for ear pain, rhinorrhea, sinus pain, sneezing and trouble swallowing.   Eyes: Positive for discharge and redness.  Respiratory: Positive for cough. Negative for chest tightness, shortness of breath and wheezing.   Cardiovascular: Negative for chest pain, palpitations and leg swelling.  Neurological: Negative.     Social History   Tobacco Use  . Smoking status: Never Smoker  . Smokeless tobacco: Never Used  Substance Use Topics  . Alcohol use: No    Alcohol/week: 0.0 standard drinks      Objective:   BP 117/71 (BP Location: Left Arm, Patient Position: Sitting, Cuff Size: Large)   Pulse 69   Temp 98.3 F (36.8 C) (Oral)   Resp 16   Wt 147 lb 12.8 oz (67 kg)   BMI 24.60 kg/m  Vitals:   01/04/19 0834  BP: 117/71  Pulse: 69  Resp: 16  Temp: 98.3 F (36.8 C)  TempSrc: Oral  Weight: 147 lb 12.8 oz (67 kg)     Physical Exam Vitals signs reviewed.  Constitutional:      General: She is not in acute distress.    Appearance: Normal appearance. She is well-developed and normal weight. She is ill-appearing. She is not diaphoretic.  HENT:     Head: Normocephalic and atraumatic.     Right Ear: Hearing, tympanic  membrane, ear canal and external ear normal.     Left Ear: Hearing, tympanic membrane, ear canal and external ear normal.     Nose: Nose normal.     Mouth/Throat:     Pharynx: Uvula midline. No oropharyngeal exudate.  Eyes:     General: No scleral icterus.       Right eye: No discharge.        Left eye: No discharge.     Conjunctiva/sclera:     Right eye: Right conjunctiva is injected.     Left eye: Left conjunctiva is injected.     Pupils: Pupils are equal, round, and reactive to light.  Neck:     Musculoskeletal: Normal range of motion and neck supple.     Thyroid: No thyromegaly.     Trachea: No tracheal deviation.  Cardiovascular:     Rate and Rhythm: Normal rate and regular rhythm.      Heart sounds: Normal heart sounds. No murmur. No friction rub. No gallop.   Pulmonary:     Effort: Pulmonary effort is normal. No respiratory distress.     Breath sounds: Normal breath sounds. No stridor. No wheezing or rales.  Lymphadenopathy:     Cervical: No cervical adenopathy.  Skin:    General: Skin is warm and dry.  Neurological:     Mental Status: She is alert.         Assessment & Plan    1. Bronchitis Worsening. Will treat with zpak and tessalon perles. Push fluids. Rest. Did advise patient to quarantine x 14 days as I am concerned of possible Covid-19.  She has sore throat, cough and conjunctivitis, all symptoms c/w Covid-19. She is only lacking fever. Call if worsening.  - azithromycin (ZITHROMAX) 250 MG tablet; Take 2 tablets PO on day one, and one tablet PO daily thereafter until completed.  Dispense: 6 tablet; Refill: 0 - benzonatate (TESSALON) 200 MG capsule; Take 1 capsule (200 mg total) by mouth 3 (three) times daily as needed for cough.  Dispense: 20 capsule; Refill: 0     Mar Daring, PA-C  [facility]

## 2019-02-02 ENCOUNTER — Other Ambulatory Visit: Payer: Self-pay

## 2019-02-02 ENCOUNTER — Ambulatory Visit (INDEPENDENT_AMBULATORY_CARE_PROVIDER_SITE_OTHER): Payer: PPO

## 2019-02-02 ENCOUNTER — Encounter: Payer: PPO | Admitting: Physician Assistant

## 2019-02-02 DIAGNOSIS — Z0000 Encounter for general adult medical examination without abnormal findings: Secondary | ICD-10-CM | POA: Diagnosis not present

## 2019-02-02 NOTE — Patient Instructions (Addendum)
Dawn Roberts , Thank you for taking time to come for your Medicare Wellness Visit. I appreciate your ongoing commitment to your health goals. Please review the following plan we discussed and let me know if I can assist you in the future.   Screening recommendations/referrals: Colonoscopy: Up to date, due 01/2025 Mammogram: Up to date, due 01/2020 Bone Density: Up to date, due 11/2024 Recommended yearly ophthalmology/optometry visit for glaucoma screening and checkup Recommended yearly dental visit for hygiene and checkup  Vaccinations: Influenza vaccine: Up to date Pneumococcal vaccine: Completed series Tdap vaccine: Pt declines today.  Shingles vaccine: Completed series    Advanced directives: Advance directive discussed with you today. Even though you declined this today please call our office should you change your mind and we can give you the proper paperwork for you to fill out.  Conditions/risks identified: None.   Next appointment: 02/17/19 @ 9:00 AM with Fenton Malling.    Preventive Care 50 Years and Older, Female Preventive care refers to lifestyle choices and visits with your health care provider that can promote health and wellness. What does preventive care include?  A yearly physical exam. This is also called an annual well check.  Dental exams once or twice a year.  Routine eye exams. Ask your health care provider how often you should have your eyes checked.  Personal lifestyle choices, including:  Daily care of your teeth and gums.  Regular physical activity.  Eating a healthy diet.  Avoiding tobacco and drug use.  Limiting alcohol use.  Practicing safe sex.  Taking low-dose aspirin every day.  Taking vitamin and mineral supplements as recommended by your health care provider. What happens during an annual well check? The services and screenings done by your health care provider during your annual well check will depend on your age, overall  health, lifestyle risk factors, and family history of disease. Counseling  Your health care provider may ask you questions about your:  Alcohol use.  Tobacco use.  Drug use.  Emotional well-being.  Home and relationship well-being.  Sexual activity.  Eating habits.  History of falls.  Memory and ability to understand (cognition).  Work and work Statistician.  Reproductive health. Screening  You may have the following tests or measurements:  Height, weight, and BMI.  Blood pressure.  Lipid and cholesterol levels. These may be checked every 5 years, or more frequently if you are over 49 years old.  Skin check.  Lung cancer screening. You may have this screening every year starting at age 48 if you have a 30-pack-year history of smoking and currently smoke or have quit within the past 15 years.  Fecal occult blood test (FOBT) of the stool. You may have this test every year starting at age 5.  Flexible sigmoidoscopy or colonoscopy. You may have a sigmoidoscopy every 5 years or a colonoscopy every 10 years starting at age 13.  Hepatitis C blood test.  Hepatitis B blood test.  Sexually transmitted disease (STD) testing.  Diabetes screening. This is done by checking your blood sugar (glucose) after you have not eaten for a while (fasting). You may have this done every 1-3 years.  Bone density scan. This is done to screen for osteoporosis. You may have this done starting at age 39.  Mammogram. This may be done every 1-2 years. Talk to your health care provider about how often you should have regular mammograms. Talk with your health care provider about your test results, treatment options, and if necessary,  the need for more tests. Vaccines  Your health care provider may recommend certain vaccines, such as:  Influenza vaccine. This is recommended every year.  Tetanus, diphtheria, and acellular pertussis (Tdap, Td) vaccine. You may need a Td booster every 10 years.   Zoster vaccine. You may need this after age 58.  Pneumococcal 13-valent conjugate (PCV13) vaccine. One dose is recommended after age 14.  Pneumococcal polysaccharide (PPSV23) vaccine. One dose is recommended after age 74. Talk to your health care provider about which screenings and vaccines you need and how often you need them. This information is not intended to replace advice given to you by your health care provider. Make sure you discuss any questions you have with your health care provider. Document Released: 10/06/2015 Document Revised: 05/29/2016 Document Reviewed: 07/11/2015 Elsevier Interactive Patient Education  2017 [place] Prevention in the Home Falls can cause injuries. They can happen to people of all ages. There are many things you can do to make your home safe and to help prevent falls. What can I do on the outside of my home?  Regularly fix the edges of walkways and driveways and fix any cracks.  Remove anything that might make you trip as you walk through a door, such as a raised step or threshold.  Trim any bushes or trees on the path to your home.  Use bright outdoor lighting.  Clear any walking paths of anything that might make someone trip, such as rocks or tools.  Regularly check to see if handrails are loose or broken. Make sure that both sides of any steps have handrails.  Any raised decks and porches should have guardrails on the edges.  Have any leaves, snow, or ice cleared regularly.  Use sand or salt on walking paths during winter.  Clean up any spills in your garage right away. This includes oil or grease spills. What can I do in the bathroom?  Use night lights.  Install grab bars by the toilet and in the tub and shower. Do not use towel bars as grab bars.  Use non-skid mats or decals in the tub or shower.  If you need to sit down in the shower, use a plastic, non-slip stool.  Keep the floor dry. Clean up any water that spills  on the floor as soon as it happens.  Remove soap buildup in the tub or shower regularly.  Attach bath mats securely with double-sided non-slip rug tape.  Do not have throw rugs and other things on the floor that can make you trip. What can I do in the bedroom?  Use night lights.  Make sure that you have a light by your bed that is easy to reach.  Do not use any sheets or blankets that are too big for your bed. They should not hang down onto the floor.  Have a firm chair that has side arms. You can use this for support while you get dressed.  Do not have throw rugs and other things on the floor that can make you trip. What can I do in the kitchen?  Clean up any spills right away.  Avoid walking on wet floors.  Keep items that you use a lot in easy-to-reach places.  If you need to reach something above you, use a strong step stool that has a grab bar.  Keep electrical cords out of the way.  Do not use floor polish or wax that makes floors slippery. If you must use  wax, use non-skid floor wax.  Do not have throw rugs and other things on the floor that can make you trip. What can I do with my stairs?  Do not leave any items on the stairs.  Make sure that there are handrails on both sides of the stairs and use them. Fix handrails that are broken or loose. Make sure that handrails are as long as the stairways.  Check any carpeting to make sure that it is firmly attached to the stairs. Fix any carpet that is loose or worn.  Avoid having throw rugs at the top or bottom of the stairs. If you do have throw rugs, attach them to the floor with carpet tape.  Make sure that you have a light switch at the top of the stairs and the bottom of the stairs. If you do not have them, ask someone to add them for you. What else can I do to help prevent falls?  Wear shoes that:  Do not have high heels.  Have rubber bottoms.  Are comfortable and fit you well.  Are closed at the toe. Do  not wear sandals.  If you use a stepladder:  Make sure that it is fully opened. Do not climb a closed stepladder.  Make sure that both sides of the stepladder are locked into place.  Ask someone to hold it for you, if possible.  Clearly mark and make sure that you can see:  Any grab bars or handrails.  First and last steps.  Where the edge of each step is.  Use tools that help you move around (mobility aids) if they are needed. These include:  Canes.  Walkers.  Scooters.  Crutches.  Turn on the lights when you go into a dark area. Replace any light bulbs as soon as they burn out.  Set up your furniture so you have a clear path. Avoid moving your furniture around.  If any of your floors are uneven, fix them.  If there are any pets around you, be aware of where they are.  Review your medicines with your doctor. Some medicines can make you feel dizzy. This can increase your chance of falling. Ask your doctor what other things that you can do to help prevent falls. This information is not intended to replace advice given to you by your health care provider. Make sure you discuss any questions you have with your health care provider. Document Released: 07/06/2009 Document Revised: 02/15/2016 Document Reviewed: 10/14/2014 Elsevier Interactive Patient Education  2017 [employer].

## 2019-02-02 NOTE — Progress Notes (Signed)
Subjective:   Dawn Roberts is a 73 y.o. female who presents for Medicare Annual (Subsequent) preventive examination.    This visit is being conducted through telemedicine due to the COVID-19 pandemic. This patient has given me verbal consent via doximity to conduct this visit, patient states they are participating from their home address. Some vital signs may be absent or patient reported.    Patient identification: identified by name, DOB, and current address  Review of Systems:  N/A  Cardiac Risk Factors include: advanced age (>19men, >2 women);diabetes mellitus;hypertension     Objective:     Vitals: There were no vitals taken for this visit.  There is no height or weight on file to calculate BMI. Unable to obtain vitals due to visit being conducted via telephonically.   Advanced Directives 02/02/2019 01/29/2018 09/02/2017 08/19/2017 01/28/2017 01/24/2016 10/24/2015  Does Patient Have a Medical Advance Directive? No No No No No No No  Would patient like information on creating a medical advance directive? No - Patient declined No - Patient declined No - Patient declined - - - -    Tobacco Social History   Tobacco Use  Smoking Status Never Smoker  Smokeless Tobacco Never Used     Counseling given: Not Answered   Clinical Intake:  Pre-visit preparation completed: Yes  Pain : No/denies pain Pain Score: 0-No pain     Nutritional Status: BMI of 19-24  Normal Nutritional Risks: None Diabetes: Yes  How often do you need to have someone help you when you read instructions, pamphlets, or other written materials from your doctor or pharmacy?: 1 - Never   Diabetes:  Is the patient diabetic?  Yes type 2 If diabetic, was a CBG obtained today?  No  Did the patient bring in their glucometer from home?  No  How often do you monitor your CBG's? Does not check BS. Managing diabetes through diet.   Financial Strains and Diabetes Management:  Are you having any financial  strains with the device, your supplies or your medication? No .  Does the patient want to be seen by Chronic Care Management for management of their diabetes?  No  Would the patient like to be referred to a Nutritionist or for Diabetic Management?  No   Diabetic Exams:  Diabetic Eye Exam: Completed 12/03/17. Overdue for diabetic eye exam. Pt has been advised about the importance in completing this exam.  Diabetic Foot Exam: Completed 01/28/17. Pt has been advised about the importance in completing this exam. Note made to f/u on this at [facility].     Interpreter Needed?: No  Information entered by :: [facility], LPN  Past Medical History:  Diagnosis Date  . Allergy   . Anemia   . Arthritis   . Cardiomegaly   . CHF (congestive heart failure) ([place])   . Hyperlipidemia   . Hypertension   . Sleep apnea    MILD   Past Surgical History:  Procedure Laterality Date  . BREAST EXCISIONAL BIOPSY Right 1976  . BREAST MASS EXCISION Right 1976   benign  . CATARACT EXTRACTION W/PHACO Left 07/29/2017   Procedure: CATARACT EXTRACTION PHACO AND INTRAOCULAR LENS PLACEMENT (IOC)-LEFT;  Surgeon: Birder Robson, MD;  Location: [facility] ORS;  Service: Ophthalmology;  Laterality: Left;  [place] 00:49 AP% 15.6 CDE 7.78 Fluid pack lot # [id number]  . CATARACT EXTRACTION W/PHACO Right 09/02/2017   Procedure: CATARACT EXTRACTION PHACO AND INTRAOCULAR LENS PLACEMENT ([place]);  Surgeon: Birder Robson, MD;  Location: [facility] ORS;  Service: Ophthalmology;  Laterality: Right;  [place] 00:58.7 AP% 18.0 CDE 10.58 Fluid Pack lot # [id number]  . COLONOSCOPY  03-29-05   Dr [name]  . COLONOSCOPY N/A 02/01/2015   Procedure: COLONOSCOPY;  Surgeon: [name], MD;  Location: [facility] ENDOSCOPY;  Service: Endoscopy;  Laterality: N/A;  . venous closure procedure Right 07-25-05   Dr. [name]   Family History  Problem Relation Age of Onset  . Diabetes Father   . Ovarian cancer Sister   . Breast cancer Sister 13  . Healthy Sister    . Healthy Brother   . Healthy Sister   . Breast cancer Maternal Aunt    Social History   Socioeconomic History  . Marital status: Divorced    Spouse name: Not on file  . Number of children: 4  . Years of education: HS  . Highest education level: 12th grade  Occupational History  . Occupation: retired  Scientific laboratory technician  . Financial resource strain: Not hard at all  . Food insecurity:    Worry: Never true    Inability: Never true  . Transportation needs:    Medical: No    Non-medical: No  Tobacco Use  . Smoking status: Never Smoker  . Smokeless tobacco: Never Used  Substance and Sexual Activity  . Alcohol use: No    Alcohol/week: 0.0 standard drinks  . Drug use: No  . Sexual activity: Yes    Birth control/protection: Post-menopausal    Comment: pt states, "I have a friend"  Lifestyle  . Physical activity:    Days per week: 0 days    Minutes per session: 0 min  . Stress: Not at all  Relationships  . Social connections:    Talks on phone: Patient refused    Gets together: Patient refused    Attends religious service: Patient refused    Active member of club or organization: Patient refused    Attends meetings of clubs or organizations: Patient refused    Relationship status: Patient refused  Other Topics Concern  . Not on file  Social History Narrative  . Not on file    Outpatient Encounter Medications as of 02/02/2019  Medication Sig  . aspirin EC 81 MG tablet Take 81 mg by mouth daily with supper.  Marland Kitchen Bioflavonoid Products (VITAMIN C) CHEW Chew 1 tablet by mouth 3 (three) times a week.  . Calcium Carb-Cholecalciferol (CALCIUM + D3 PO) Take 1 tablet by mouth 2 (two) times daily.  . carvedilol (COREG) 6.25 MG tablet Take 6.25 mg by mouth 2 (two) times daily.  Marland Kitchen losartan (COZAAR) 50 MG tablet Take 50 mg by mouth daily.   . Misc Natural Products (OSTEO BI-FLEX ADV JOINT SHIELD PO) Take 1 tablet by mouth 2 (two) times daily.   . Multiple Vitamin (MULTIVITAMIN WITH  MINERALS) TABS tablet Take 1 tablet by mouth daily with lunch.  . spironolactone (ALDACTONE) 25 MG tablet Take 25 mg by mouth daily.  Marland Kitchen tretinoin (RETIN-A) 0.1 % cream APPLY 1 APPLICATION ON THE SKIN AT BEDTIME EVERY OTHER NIGHT.  Marland Kitchen vitamin B-12 (CYANOCOBALAMIN) 1000 MCG tablet Take 1,000 mcg by mouth daily.  Marland Kitchen azithromycin (ZITHROMAX) 250 MG tablet Take 2 tablets PO on day one, and one tablet PO daily thereafter until completed. (Patient not taking: Reported on 02/02/2019)  . benzonatate (TESSALON) 200 MG capsule Take 1 capsule (200 mg total) by mouth 3 (three) times daily as needed for cough. (Patient not taking: Reported on 02/02/2019)   No facility-administered encounter  medications on file as of 02/02/2019.     Activities of Daily Living In your present state of health, do you have any difficulty performing the following activities: 02/02/2019  Hearing? N  Vision? N  Comment Wears readers as needed.  Difficulty concentrating or making decisions? N  Walking or climbing stairs? N  Dressing or bathing? N  Doing errands, shopping? N  Preparing Food and eating ? N  Using the Toilet? N  In the past six months, have you accidently leaked urine? N  Do you have problems with loss of bowel control? N  Managing your Medications? N  Managing your Finances? N  Housekeeping or managing your Housekeeping? N  Some recent data might be hidden    Patient Care Team: [name] as PCP - General (Family Medicine) Corey Skains, MD as Consulting Physician (Cardiology) [name], MD as Consulting Physician (Ophthalmology) Birder Robson, MD as Referring Physician (Ophthalmology)    Assessment:   This is a routine wellness examination for Nariyah.  Exercise Activities and Dietary recommendations Current Exercise Habits: Home exercise routine, Type of exercise: treadmill;walking, Time (Minutes): 25, Frequency (Times/Week): 5, Weekly Exercise (Minutes/Week): 125,  Intensity: Mild, Exercise limited by: None identified  Goals    . DIET - INCREASE WATER INTAKE     Recommend increasing water intake to 4-6 glasses a day.     Marland Kitchen HEMOGLOBIN A1C < 7.0       Fall Risk: Fall Risk  02/02/2019 01/29/2018 01/28/2017 01/24/2016 03/06/2015  Falls in the past year? 0 No No No No    FALL RISK PREVENTION PERTAINING TO THE HOME:  Any stairs in or around the home? No  If so, are there any without handrails? N/A  Home free of loose throw rugs in walkways, pet beds, electrical cords, etc? Yes  Adequate lighting in your home to reduce risk of falls? Yes   ASSISTIVE DEVICES UTILIZED TO PREVENT FALLS:  Life alert? No  Use of a cane, walker or w/c? No  Grab bars in the bathroom? Yes  Shower chair or bench in shower? No  Elevated toilet seat or a handicapped toilet? Yes   TIMED UP AND GO:  Was the test performed? No .    Depression Screen PHQ 2/9 Scores 02/02/2019 02/02/2019 01/29/2018 01/28/2017  PHQ - 2 Score 0 0 0 0  PHQ- 9 Score 0 - - 0     Cognitive Function     6CIT Screen 02/02/2019 01/29/2018  What Year? 0 points 0 points  What month? 0 points 0 points  What time? 0 points 0 points  Count back from 20 0 points 0 points  Months in reverse 0 points 0 points  Repeat phrase 2 points 2 points  Total Score 2 2    Immunization History  Administered Date(s) Administered  . Influenza, High Dose Seasonal PF 07/14/2014, 06/12/2015, 06/28/2017  . Influenza-Unspecified 07/06/2018  . Pneumococcal Conjugate-13 12/07/2014  . Pneumococcal Polysaccharide-23 09/26/2011  . Tdap 02/10/2006  . Zoster Recombinat (Shingrix) 09/11/2018, 12/08/2018    Qualifies for Shingles Vaccine? Up to date  Tdap: Although this vaccine is not a covered service during a Wellness Exam, does the patient still wish to receive this vaccine today?  No .  Advised may receive this vaccine at local pharmacy or Health Dept. Aware to provide a copy of the vaccination record if obtained from local  pharmacy or Health Dept. Verbalized acceptance and understanding.  Flu Vaccine: Up to date  Pneumococcal  Vaccine: Up to date  Screening Tests Health Maintenance  Topic Date Due  . TETANUS/TDAP  02/11/2016  . FOOT EXAM  01/28/2018  . HEMOGLOBIN A1C  08/01/2018  . OPHTHALMOLOGY EXAM  12/04/2018  . INFLUENZA VACCINE  04/24/2019  . MAMMOGRAM  02/20/2020  . DEXA SCAN  12/20/2024  . COLONOSCOPY  01/31/2025  . Hepatitis C Screening  Completed  . PNA vac Low Risk Adult  Completed    Cancer Screenings:  Colorectal Screening: Completed 02/01/15. Repeat every 10 years.  Mammogram: Completed 02/19/18.   Bone Density: Completed 12/21/14. Results reflect NORMAL. Repeat every 10 years.   Lung Cancer Screening: (Low Dose CT Chest recommended if Age 32-80 years, 30 pack-year currently smoking OR have quit w/in 15years.) does not qualify.   Additional Screening:  Hepatitis C Screening: Up to date  Dental Screening: Recommended annual dental exams for proper oral hygiene   Community Resource Referral:  CRR required this visit?  No       Plan:  I have personally reviewed and addressed the Medicare Annual Wellness questionnaire and have noted the following in the patient's chart:  A. Medical and social history B. Use of alcohol, tobacco or illicit drugs  C. Current medications and supplements D. Functional ability and status E.  Nutritional status F.  Physical activity G. Advance directives H. List of other physicians I.  Hospitalizations, surgeries, and ER visits in previous 12 months J.  [place] such as hearing and vision if needed, cognitive and depression L. Referrals and appointments   In addition, I have reviewed and discussed with patient certain preventive protocols, quality metrics, and best practice recommendations. A written personalized care plan for preventive services as well as general preventive health recommendations were provided to patient.    Glendora Score, [place]  8/89/1694 Nurse Health Advisor   Nurse Notes: Pt needs a diabetic foot check and her Hgb A1c checked at CPE. Pt plans to set up an eye exam this year. Pt declined a future order for a tetanus vaccine.

## 2019-02-17 ENCOUNTER — Ambulatory Visit (INDEPENDENT_AMBULATORY_CARE_PROVIDER_SITE_OTHER): Payer: PPO | Admitting: Physician Assistant

## 2019-02-17 ENCOUNTER — Other Ambulatory Visit: Payer: Self-pay

## 2019-02-17 ENCOUNTER — Encounter: Payer: Self-pay | Admitting: Physician Assistant

## 2019-02-17 VITALS — BP 110/60 | HR 60 | Temp 98.8°F | Resp 16 | Ht 65.0 in | Wt 143.0 lb

## 2019-02-17 DIAGNOSIS — R748 Abnormal levels of other serum enzymes: Secondary | ICD-10-CM | POA: Diagnosis not present

## 2019-02-17 DIAGNOSIS — I429 Cardiomyopathy, unspecified: Secondary | ICD-10-CM

## 2019-02-17 DIAGNOSIS — E119 Type 2 diabetes mellitus without complications: Secondary | ICD-10-CM

## 2019-02-17 DIAGNOSIS — I5022 Chronic systolic (congestive) heart failure: Secondary | ICD-10-CM

## 2019-02-17 DIAGNOSIS — E782 Mixed hyperlipidemia: Secondary | ICD-10-CM

## 2019-02-17 DIAGNOSIS — Z0000 Encounter for general adult medical examination without abnormal findings: Secondary | ICD-10-CM | POA: Diagnosis not present

## 2019-02-17 DIAGNOSIS — I10 Essential (primary) hypertension: Secondary | ICD-10-CM

## 2019-02-17 NOTE — Progress Notes (Signed)
Patient: Dawn Roberts, Female    DOB: [birth year], 73 y.o.   MRN: [id number] Visit Date: 02/17/2019  Today's Provider: Mar Daring, PA-C   Chief Complaint  Patient presents with  . Annual Exam   Subjective:   Patient had her AWV with [facility] on 02/02/2019  Complete Physical Dawn Roberts is a 73 y.o. female. She feels well. She reports exercising. She reports she is sleeping well. ----------------------------------------------------------- Eye Exam:Scheduled for 04/07/2019 Mammogram scheduled 03/03/2019  Review of Systems  Constitutional: Negative.   HENT: Negative.   Eyes: Positive for itching.  Respiratory: Negative.   Cardiovascular: Negative.   Gastrointestinal: Negative.   Endocrine: Negative.   Genitourinary: Negative.   Musculoskeletal: Negative.   Skin: Negative.   Allergic/Immunologic: Positive for environmental allergies.  Neurological: Negative.   Hematological: Negative.   Psychiatric/Behavioral: Negative.     Social History   Socioeconomic History  . Marital status: Divorced    Spouse name: Not on file  . Number of children: 4  . Years of education: HS  . Highest education level: 12th grade  Occupational History  . Occupation: retired  Scientific laboratory technician  . Financial resource strain: Not hard at all  . Food insecurity:    Worry: Never true    Inability: Never true  . Transportation needs:    Medical: No    Non-medical: No  Tobacco Use  . Smoking status: Never Smoker  . Smokeless tobacco: Never Used  Substance and Sexual Activity  . Alcohol use: No    Alcohol/week: 0.0 standard drinks  . Drug use: No  . Sexual activity: Yes    Birth control/protection: Post-menopausal    Comment: pt states, "I have a friend"  Lifestyle  . Physical activity:    Days per week: 0 days    Minutes per session: 0 min  . Stress: Not at all  Relationships  . Social connections:    Talks on phone: Patient refused    Gets together: Patient refused    Attends religious service: Patient refused    Active member of club or organization: Patient refused    Attends meetings of clubs or organizations: Patient refused    Relationship status: Patient refused  . Intimate partner violence:    Fear of current or ex partner: Patient refused    Emotionally abused: Patient refused    Physically abused: Patient refused    Forced sexual activity: Patient refused  Other Topics Concern  . Not on file  Social History Narrative  . Not on file    Past Medical History:  Diagnosis Date  . Allergy   . Anemia   . Arthritis   . Cardiomegaly   . CHF (congestive heart failure) ([place])   . Hyperlipidemia   . Hypertension   . Sleep apnea    MILD     Patient Active Problem List   Diagnosis Date Noted  . Genetic testing 08/17/2017  . Diabetes mellitus with no complication ([place]) 41/32/4401  . Bradycardia 03/07/2015  . Allergic rhinitis 01/18/2015  . Body mass index (BMI) of 23.0-23.9 in adult 01/18/2015  . Abnormal liver enzymes 01/18/2015  . Chronic systolic heart failure ([place]) 09/06/2014  . Cardiomyopathy ([place]) 05/02/2014  . Essential (primary) hypertension 05/02/2014  . Apnea, sleep 05/02/2014  . Combined fat and carbohydrate induced hyperlipemia 05/02/2014  . Absolute anemia 04/15/2008  . Cannot sleep 06/12/2007  . Abnormal ECG 02/19/2007    Past Surgical History:  Procedure Laterality Date  .  BREAST EXCISIONAL BIOPSY Right 1976  . BREAST MASS EXCISION Right 1976   benign  . CATARACT EXTRACTION W/PHACO Left 07/29/2017   Procedure: CATARACT EXTRACTION PHACO AND INTRAOCULAR LENS PLACEMENT (IOC)-LEFT;  Surgeon: Birder Robson, MD;  Location: [facility] ORS;  Service: Ophthalmology;  Laterality: Left;  [place] 00:49 AP% 15.6 CDE 7.78 Fluid pack lot # [id number]  . CATARACT EXTRACTION W/PHACO Right 09/02/2017   Procedure: CATARACT EXTRACTION PHACO AND INTRAOCULAR LENS PLACEMENT ([place]);  Surgeon: Birder Robson, MD;  Location: [facility] ORS;  Service:  Ophthalmology;  Laterality: Right;  [place] 00:58.7 AP% 18.0 CDE 10.58 Fluid Pack lot # [id number]  . COLONOSCOPY  03-29-05   Dr [name]  . COLONOSCOPY N/A 02/01/2015   Procedure: COLONOSCOPY;  Surgeon: [name], MD;  Location: [facility] ENDOSCOPY;  Service: Endoscopy;  Laterality: N/A;  . venous closure procedure Right 07-25-05   Dr. [name]    Her family history includes Breast cancer in her maternal aunt; Breast cancer (age of onset: 76) in her sister; Diabetes in her father; Healthy in her brother, sister, and sister; Ovarian cancer in her sister.   Current Outpatient Medications:  .  aspirin EC 81 MG tablet, Take 81 mg by mouth daily with supper., Disp: , Rfl:  .  Bioflavonoid Products (VITAMIN C) CHEW, Chew 1 tablet by mouth 3 (three) times a week., Disp: , Rfl:  .  Calcium Carb-Cholecalciferol (CALCIUM + D3 PO), Take 1 tablet by mouth 2 (two) times daily., Disp: , Rfl:  .  carvedilol (COREG) 6.25 MG tablet, Take 6.25 mg by mouth 2 (two) times daily., Disp: , Rfl: 5 .  losartan (COZAAR) 50 MG tablet, Take 50 mg by mouth daily. , Disp: , Rfl:  .  Misc Natural Products (OSTEO BI-FLEX ADV JOINT SHIELD PO), Take 1 tablet by mouth 2 (two) times daily. , Disp: , Rfl:  .  Multiple Vitamin (MULTIVITAMIN WITH MINERALS) TABS tablet, Take 1 tablet by mouth daily with lunch., Disp: , Rfl:  .  spironolactone (ALDACTONE) 25 MG tablet, Take 25 mg by mouth daily., Disp: , Rfl: 5 .  tretinoin (RETIN-A) 0.1 % cream, APPLY 1 APPLICATION ON THE SKIN AT BEDTIME EVERY OTHER NIGHT., Disp: , Rfl: 6 .  vitamin B-12 (CYANOCOBALAMIN) 1000 MCG tablet, Take 1,000 mcg by mouth daily., Disp: , Rfl:   Patient Care Team: Mar Daring, PA-C as PCP - General (Family Medicine) Corey Skains, MD as Consulting Physician (Cardiology) [name], MD as Consulting Physician (Ophthalmology) Birder Robson, MD as Referring Physician (Ophthalmology)     Objective:    Vitals: BP 110/60 (BP Location:  Left Arm, Patient Position: Sitting, Cuff Size: Large)   Pulse 60   Temp 98.8 F (37.1 C) (Oral)   Resp 16   Ht 5\' 5"  (1.651 m)   Wt 143 lb (64.9 kg)   SpO2 97%   BMI 23.80 kg/m   Physical Exam Vitals signs reviewed.  Constitutional:      General: She is not in acute distress.    Appearance: Normal appearance. She is well-developed and normal weight. She is not ill-appearing or diaphoretic.  HENT:     Head: Normocephalic and atraumatic.     Right Ear: Tympanic membrane, ear canal and external ear normal.     Left Ear: Tympanic membrane, ear canal and external ear normal.     Nose: Nose normal.     Mouth/Throat:     Mouth: Mucous membranes are moist.     Pharynx: Oropharynx is  clear. No oropharyngeal exudate.  Eyes:     General: No scleral icterus.       Right eye: No discharge.        Left eye: No discharge.     Extraocular Movements: Extraocular movements intact.     Conjunctiva/sclera: Conjunctivae normal.     Pupils: Pupils are equal, round, and reactive to light.  Neck:     Musculoskeletal: Normal range of motion and neck supple.     Thyroid: No thyromegaly.     Vascular: No carotid bruit or JVD.     Trachea: No tracheal deviation.  Cardiovascular:     Rate and Rhythm: Normal rate and regular rhythm.     Pulses: Normal pulses.     Heart sounds: Normal heart sounds. No murmur. No friction rub. No gallop.   Pulmonary:     Effort: Pulmonary effort is normal. No respiratory distress.     Breath sounds: Normal breath sounds. No wheezing or rales.  Chest:     Chest wall: No tenderness.     Breasts:        Right: Normal.        Left: Normal.  Abdominal:     General: Bowel sounds are normal. There is no distension.     Palpations: Abdomen is soft. There is no mass.     Tenderness: There is no abdominal tenderness. There is no guarding or rebound.  Musculoskeletal: Normal range of motion.        General: No tenderness.     Right lower leg: No edema.     Left lower  leg: No edema.  Lymphadenopathy:     Cervical: No cervical adenopathy.  Skin:    General: Skin is warm and dry.     Findings: No rash.  Neurological:     General: No focal deficit present.     Mental Status: She is alert and oriented to person, place, and time. Mental status is at baseline.     Cranial Nerves: No cranial nerve deficit.     Motor: No weakness.     Gait: Gait normal.  Psychiatric:        Mood and Affect: Mood normal.        Behavior: Behavior normal.        Thought Content: Thought content normal.        Judgment: Judgment normal.     Activities of Daily Living In your present state of health, do you have any difficulty performing the following activities: 02/02/2019  Hearing? N  Vision? N  Comment Wears readers as needed.  Difficulty concentrating or making decisions? N  Walking or climbing stairs? N  Dressing or bathing? N  Doing errands, shopping? N  Preparing Food and eating ? N  Using the Toilet? N  In the past six months, have you accidently leaked urine? N  Do you have problems with loss of bowel control? N  Managing your Medications? N  Managing your Finances? N  Housekeeping or managing your Housekeeping? N  Some recent data might be hidden    Fall Risk Assessment Fall Risk  02/02/2019 01/29/2018 01/28/2017 01/24/2016 03/06/2015  Falls in the past year? 0 No No No No     Depression Screen PHQ 2/9 Scores 02/02/2019 02/02/2019 01/29/2018 01/28/2017  PHQ - 2 Score 0 0 0 0  PHQ- 9 Score 0 - - 0    6CIT Screen 02/02/2019  What Year? 0 points  What month? 0 points  What  time? 0 points  Count back from 20 0 points  Months in reverse 0 points  Repeat phrase 2 points  Total Score 2       Assessment & Plan:    Annual Physical Reviewed patient's Family Medical History Reviewed and updated list of patient's medical providers Assessment of cognitive impairment was done Assessed patient's functional ability Established a written schedule for health  screening [place] Completed and Reviewed  Exercise Activities and Dietary recommendations Goals    . DIET - INCREASE WATER INTAKE     Recommend increasing water intake to 4-6 glasses a day.     Marland Kitchen HEMOGLOBIN A1C < 7.0       Immunization History  Administered Date(s) Administered  . Influenza, High Dose Seasonal PF 07/14/2014, 06/12/2015, 06/28/2017  . Influenza-Unspecified 07/06/2018  . Pneumococcal Conjugate-13 12/07/2014  . Pneumococcal Polysaccharide-23 09/26/2011  . Tdap 02/10/2006  . Zoster Recombinat (Shingrix) 09/11/2018, 12/08/2018    Health Maintenance  Topic Date Due  . Samul Dada  02/11/2016  . FOOT EXAM  01/28/2018  . HEMOGLOBIN A1C  08/01/2018  . OPHTHALMOLOGY EXAM  12/04/2018  . INFLUENZA VACCINE  04/24/2019  . MAMMOGRAM  02/20/2020  . DEXA SCAN  12/20/2024  . COLONOSCOPY  01/31/2025  . Hepatitis C Screening  Completed  . PNA vac Low Risk Adult  Completed     Discussed health benefits of physical activity, and encouraged her to engage in regular exercise appropriate for her age and condition.    1. Annual physical exam Normal physical exam today. Will check labs as below and f/u pending lab results. If labs are stable and WNL she will not need to have these rechecked for one year at her next annual physical exam. She is to call the office in the meantime if she has any acute issue, questions or concerns. - CBC with Differential/Platelet - Comprehensive metabolic panel - Hemoglobin A1c - Lipid panel - TSH  2. Cardiomyopathy, unspecified type [facility]) Followed by Cardiology, dr. Nehemiah Massed. Stable. Continue medication management as prescribed by Dr. Cammie Sickle.  - CBC with Differential/Platelet - Comprehensive metabolic panel - Hemoglobin A1c - Lipid panel - TSH  3. Chronic systolic heart failure (HCC) Stable. Followed by Dr. Nehemiah Massed.  - CBC with Differential/Platelet - Comprehensive metabolic panel - Hemoglobin A1c - Lipid  panel - TSH  4. Essential (primary) hypertension Stable. Continue Coreg 6.25mg  BID, losartan 50mg  daily and spironolactone 25mg  daily. Will check labs as below and f/u pending results. - CBC with Differential/Platelet - Comprehensive metabolic panel - Hemoglobin A1c - Lipid panel - TSH  5. Diabetes mellitus with no complication (HCC) Diet controlled. Will check labs as below and f/u pending results. - CBC with Differential/Platelet - Comprehensive metabolic panel - Hemoglobin A1c - Lipid panel - TSH  6. Abnormal liver enzymes H/O this. On Spironolactone 25mg  daily. Will check labs as below and f/u pending results. - CBC with Differential/Platelet - Comprehensive metabolic panel - Hemoglobin A1c - Lipid panel - TSH  7. Combined fat and carbohydrate induced hyperlipemia Diet controlled. Will check labs as below and f/u pending results. - CBC with Differential/Platelet - Comprehensive metabolic panel - Hemoglobin A1c - Lipid panel - TSH  ------------------------------------------------------------------------------------------------------------    Mar Daring, PA-C  [facility]

## 2019-02-17 NOTE — Patient Instructions (Signed)
Health Maintenance After Age 74 After age 74, you are at a higher risk for certain long-term diseases and infections as well as injuries from falls. Falls are a major cause of broken bones and head injuries in people who are older than age 74. Getting regular preventive care can help to keep you healthy and well. Preventive care includes getting regular testing and making lifestyle changes as recommended by your health care provider. Talk with your health care provider about:  Which screenings and tests you should have. A screening is a test that checks for a disease when you have no symptoms.  A diet and exercise plan that is right for you. What should I know about screenings and tests to prevent falls? Screening and testing are the best ways to find a health problem early. Early diagnosis and treatment give you the best chance of managing medical conditions that are common after age 74. Certain conditions and lifestyle choices may make you more likely to have a fall. Your health care provider may recommend:  Regular vision checks. Poor vision and conditions such as cataracts can make you more likely to have a fall. If you wear glasses, make sure to get your prescription updated if your vision changes.  Medicine review. Work with your health care provider to regularly review all of the medicines you are taking, including over-the-counter medicines. Ask your health care provider about any side effects that may make you more likely to have a fall. Tell your health care provider if any medicines that you take make you feel dizzy or sleepy.  Osteoporosis screening. Osteoporosis is a condition that causes the bones to get weaker. This can make the bones weak and cause them to break more easily.  Blood pressure screening. Blood pressure changes and medicines to control blood pressure can make you feel dizzy.  Strength and balance checks. Your health care provider may recommend certain tests to check your  strength and balance while standing, walking, or changing positions.  Foot health exam. Foot pain and numbness, as well as not wearing proper footwear, can make you more likely to have a fall.  Depression screening. You may be more likely to have a fall if you have a fear of falling, feel emotionally low, or feel unable to do activities that you used to do.  Alcohol use screening. Using too much alcohol can affect your balance and may make you more likely to have a fall. What actions can I take to lower my risk of falls? General instructions  Talk with your health care provider about your risks for falling. Tell your health care provider if: ? You fall. Be sure to tell your health care provider about all falls, even ones that seem minor. ? You feel dizzy, sleepy, or off-balance.  Take over-the-counter and prescription medicines only as told by your health care provider. These include any supplements.  Eat a healthy diet and maintain a healthy weight. A healthy diet includes low-fat dairy products, low-fat (lean) meats, and fiber from whole grains, beans, and lots of fruits and vegetables. Home safety  Remove any tripping hazards, such as rugs, cords, and clutter.  Install safety equipment such as grab bars in bathrooms and safety rails on stairs.  Keep rooms and walkways well-lit. Activity   Follow a regular exercise program to stay fit. This will help you maintain your balance. Ask your health care provider what types of exercise are appropriate for you.  If you need a cane or   walker, use it as recommended by your health care provider.  Wear supportive shoes that have nonskid soles. Lifestyle  Do not drink alcohol if your health care provider tells you not to drink.  If you drink alcohol, limit how much you have: ? 0-1 drink a day for women. ? 0-2 drinks a day for men.  Be aware of how much alcohol is in your drink. In the [place] one drink equals one typical bottle of beer (12  oz), one-half glass of wine (5 oz), or one shot of hard liquor (1 oz).  Do not use any products that contain nicotine or tobacco, such as cigarettes and e-cigarettes. If you need help quitting, ask your health care provider. Summary  Having a healthy lifestyle and getting preventive care can help to protect your health and wellness after age 74.  Screening and testing are the best way to find a health problem early and help you avoid having a fall. Early diagnosis and treatment give you the best chance for managing medical conditions that are more common for people who are older than age 74.  Falls are a major cause of broken bones and head injuries in people who are older than age 74. Take precautions to prevent a fall at home.  Work with your health care provider to learn what changes you can make to improve your health and wellness and to prevent falls. This information is not intended to replace advice given to you by your health care provider. Make sure you discuss any questions you have with your health care provider. Document Released: 07/23/2017 Document Revised: 07/23/2017 Document Reviewed: 07/23/2017 Elsevier Interactive Patient Education  2019 [employer]  

## 2019-02-18 LAB — COMPREHENSIVE METABOLIC PANEL
ALT: 17 IU/L (ref 0–32)
AST: 21 IU/L (ref 0–40)
Albumin/Globulin Ratio: 2.3 — ABNORMAL HIGH (ref 1.2–2.2)
Albumin: 4.3 g/dL (ref 3.7–4.7)
Alkaline Phosphatase: 36 IU/L — ABNORMAL LOW (ref 39–117)
BUN/Creatinine Ratio: 28 (ref 12–28)
BUN: 29 mg/dL — ABNORMAL HIGH (ref 8–27)
Bilirubin Total: 0.3 mg/dL (ref 0.0–1.2)
CO2: 24 mmol/L (ref 20–29)
Calcium: 10 mg/dL (ref 8.7–10.3)
Chloride: 104 mmol/L (ref 96–106)
Creatinine, Ser: 1.04 mg/dL — ABNORMAL HIGH (ref 0.57–1.00)
GFR calc Af Amer: 62 mL/min/{1.73_m2} (ref >59)
GFR calc non Af Amer: 53 mL/min/{1.73_m2} — ABNORMAL LOW (ref >59)
Globulin, Total: 1.9 g/dL (ref 1.5–4.5)
Glucose: 95 mg/dL (ref 65–99)
Potassium: 4.4 mmol/L (ref 3.5–5.2)
Sodium: 142 mmol/L (ref 134–144)
Total Protein: 6.2 g/dL (ref 6.0–8.5)

## 2019-02-18 LAB — CBC WITH DIFFERENTIAL/PLATELET
Basophils Absolute: 0.1 10*3/uL (ref 0.0–0.2)
Basos: 1 %
EOS (ABSOLUTE): 0.2 10*3/uL (ref 0.0–0.4)
Eos: 4 %
Hematocrit: 34.7 % (ref 34.0–46.6)
Hemoglobin: 11.3 g/dL (ref 11.1–15.9)
Immature Grans (Abs): 0 10*3/uL (ref 0.0–0.1)
Immature Granulocytes: 0 %
Lymphocytes Absolute: 2.2 10*3/uL (ref 0.7–3.1)
Lymphs: 40 %
MCH: 29.3 pg (ref 26.6–33.0)
MCHC: 32.6 g/dL (ref 31.5–35.7)
MCV: 90 fL (ref 79–97)
Monocytes Absolute: 0.4 10*3/uL (ref 0.1–0.9)
Monocytes: 7 %
Neutrophils Absolute: 2.5 10*3/uL (ref 1.4–7.0)
Neutrophils: 48 %
Platelets: 245 10*3/uL (ref 150–450)
RBC: 3.86 x10E6/uL (ref 3.77–5.28)
RDW: 12.7 % (ref 11.7–15.4)
WBC: 5.4 10*3/uL (ref 3.4–10.8)

## 2019-02-18 LAB — HEMOGLOBIN A1C
Est. average glucose Bld gHb Est-mCnc: 123 mg/dL
Hgb A1c MFr Bld: 5.9 % — ABNORMAL HIGH (ref 4.8–5.6)

## 2019-02-18 LAB — TSH: TSH: 0.997 u[IU]/mL (ref 0.450–4.500)

## 2019-02-18 LAB — LIPID PANEL
Chol/HDL Ratio: 3.1 ratio (ref 0.0–4.4)
Cholesterol, Total: 131 mg/dL (ref 100–199)
HDL: 42 mg/dL (ref >39)
LDL Calculated: 78 mg/dL (ref 0–99)
Triglycerides: 57 mg/dL (ref 0–149)
VLDL Cholesterol Cal: 11 mg/dL (ref 5–40)

## 2019-02-19 ENCOUNTER — Telehealth: Payer: Self-pay

## 2019-02-19 NOTE — Telephone Encounter (Signed)
-----   Message from Mar Daring, [place] sent at 02/18/2019  1:16 PM EDT ----- Blood count is normal. Kidney and liver function are normal and stable. A1c is improved to 5.9 from 6.1. Cholesterol is normal. Thyroid is normal.

## 2019-02-19 NOTE — Telephone Encounter (Signed)
Patient notified about lab results.

## 2019-02-19 NOTE — Telephone Encounter (Signed)
LMTCB

## 2019-03-03 ENCOUNTER — Telehealth: Payer: Self-pay

## 2019-03-03 ENCOUNTER — Other Ambulatory Visit: Payer: Self-pay

## 2019-03-03 ENCOUNTER — Ambulatory Visit
Admission: RE | Admit: 2019-03-03 | Discharge: 2019-03-03 | Disposition: A | Discharge status: Home / Self Care | Payer: PPO | Source: Ambulatory Visit | Attending: Physician Assistant | Admitting: Physician Assistant

## 2019-03-03 DIAGNOSIS — Z1239 Encounter for other screening for malignant neoplasm of breast: Secondary | ICD-10-CM

## 2019-03-03 DIAGNOSIS — Z803 Family history of malignant neoplasm of breast: Secondary | ICD-10-CM | POA: Diagnosis not present

## 2019-03-03 DIAGNOSIS — Z1231 Encounter for screening mammogram for malignant neoplasm of breast: Secondary | ICD-10-CM | POA: Diagnosis not present

## 2019-03-03 NOTE — Telephone Encounter (Signed)
Patient was advised.  

## 2019-03-03 NOTE — Telephone Encounter (Signed)
-----   Message from Mar Daring, [place] sent at 03/03/2019  3:07 PM EDT ----- Normal mammogram. Repeat screening in one year.

## 2019-04-07 DIAGNOSIS — H43813 Vitreous degeneration, bilateral: Secondary | ICD-10-CM | POA: Diagnosis not present

## 2019-04-07 DIAGNOSIS — H3554 Dystrophies primarily involving the retinal pigment epithelium: Secondary | ICD-10-CM | POA: Diagnosis not present

## 2019-05-11 DIAGNOSIS — I420 Dilated cardiomyopathy: Secondary | ICD-10-CM | POA: Diagnosis not present

## 2019-05-11 DIAGNOSIS — G4730 Sleep apnea, unspecified: Secondary | ICD-10-CM | POA: Diagnosis not present

## 2019-05-11 DIAGNOSIS — E782 Mixed hyperlipidemia: Secondary | ICD-10-CM | POA: Diagnosis not present

## 2019-05-11 DIAGNOSIS — I5022 Chronic systolic (congestive) heart failure: Secondary | ICD-10-CM | POA: Diagnosis not present

## 2019-05-11 DIAGNOSIS — I10 Essential (primary) hypertension: Secondary | ICD-10-CM | POA: Diagnosis not present

## 2019-07-16 DIAGNOSIS — L814 Other melanin hyperpigmentation: Secondary | ICD-10-CM | POA: Diagnosis not present

## 2019-07-16 DIAGNOSIS — L821 Other seborrheic keratosis: Secondary | ICD-10-CM | POA: Diagnosis not present

## 2019-07-16 DIAGNOSIS — L570 Actinic keratosis: Secondary | ICD-10-CM | POA: Diagnosis not present

## 2019-07-16 DIAGNOSIS — Z1283 Encounter for screening for malignant neoplasm of skin: Secondary | ICD-10-CM | POA: Diagnosis not present

## 2019-07-16 DIAGNOSIS — D239 Other benign neoplasm of skin, unspecified: Secondary | ICD-10-CM

## 2019-07-16 DIAGNOSIS — L820 Inflamed seborrheic keratosis: Secondary | ICD-10-CM | POA: Diagnosis not present

## 2019-07-16 DIAGNOSIS — D1800 Hemangioma unspecified site: Secondary | ICD-10-CM | POA: Diagnosis not present

## 2019-07-16 DIAGNOSIS — D225 Melanocytic nevi of trunk: Secondary | ICD-10-CM | POA: Diagnosis not present

## 2019-07-16 DIAGNOSIS — D485 Neoplasm of uncertain behavior of skin: Secondary | ICD-10-CM | POA: Diagnosis not present

## 2019-07-16 DIAGNOSIS — L720 Epidermal cyst: Secondary | ICD-10-CM | POA: Diagnosis not present

## 2019-07-16 DIAGNOSIS — D2262 Melanocytic nevi of left upper limb, including shoulder: Secondary | ICD-10-CM | POA: Diagnosis not present

## 2019-07-16 HISTORY — DX: Other benign neoplasm of skin, unspecified: D23.9

## 2019-10-19 DIAGNOSIS — Z86018 Personal history of other benign neoplasm: Secondary | ICD-10-CM | POA: Diagnosis not present

## 2019-10-19 DIAGNOSIS — Z872 Personal history of diseases of the skin and subcutaneous tissue: Secondary | ICD-10-CM | POA: Diagnosis not present

## 2019-10-19 DIAGNOSIS — D1801 Hemangioma of skin and subcutaneous tissue: Secondary | ICD-10-CM | POA: Diagnosis not present

## 2019-10-19 DIAGNOSIS — L821 Other seborrheic keratosis: Secondary | ICD-10-CM | POA: Diagnosis not present

## 2019-11-09 DIAGNOSIS — E782 Mixed hyperlipidemia: Secondary | ICD-10-CM | POA: Diagnosis not present

## 2019-11-09 DIAGNOSIS — I420 Dilated cardiomyopathy: Secondary | ICD-10-CM | POA: Diagnosis not present

## 2019-11-09 DIAGNOSIS — I10 Essential (primary) hypertension: Secondary | ICD-10-CM | POA: Diagnosis not present

## 2019-11-09 DIAGNOSIS — I5022 Chronic systolic (congestive) heart failure: Secondary | ICD-10-CM | POA: Diagnosis not present

## 2019-11-09 DIAGNOSIS — G4730 Sleep apnea, unspecified: Secondary | ICD-10-CM | POA: Diagnosis not present

## 2020-01-28 ENCOUNTER — Other Ambulatory Visit: Payer: Self-pay | Admitting: Physician Assistant

## 2020-01-28 DIAGNOSIS — Z1231 Encounter for screening mammogram for malignant neoplasm of breast: Secondary | ICD-10-CM

## 2020-02-02 NOTE — Progress Notes (Signed)
Subjective:   Dawn Roberts is a 74 y.o. female who presents for Medicare Annual (Subsequent) preventive examination.  I connected with Rolla Flatten today by telephone and verified that I am speaking with the correct person using two identifiers. Location patient: home Location provider: work Persons participating in the virtual visit: patient, provider.   I discussed the limitations, risks, security and privacy concerns of performing an evaluation and management service by telephone and the availability of in person appointments. I also discussed with the patient that there may be a patient responsible charge related to this service. The patient expressed understanding and verbally consented to this telephonic visit.    Interactive audio and video telecommunications were attempted between this provider and patient, however failed, due to patient having technical difficulties OR patient did not have access to video capability.  We continued and completed visit with audio only.     Time Spent with patient on telephone encounter: 24 minutes  Review of Systems:  N/A  Cardiac Risk Factors include: advanced age (>78men, >41 women);diabetes mellitus;dyslipidemia;hypertension     Objective:     Vitals: There were no vitals taken for this visit.  There is no height or weight on file to calculate BMI. Unable to obtain vitals due to visit being conducted via telephonically.   Advanced Directives 02/03/2020 02/02/2019 01/29/2018 09/02/2017 08/19/2017 01/28/2017 01/24/2016  Does Patient Have a Medical Advance Directive? No No No No No No No  Would patient like information on creating a medical advance directive? No - Patient declined No - Patient declined No - Patient declined No - Patient declined - - -    Tobacco Social History   Tobacco Use  Smoking Status Never Smoker  Smokeless Tobacco Never Used     Counseling given: Not Answered   Clinical Intake:  Pre-visit preparation  completed: Yes  Pain : No/denies pain Pain Score: 0-No pain     Nutritional Risks: None Diabetes: Yes  How often do you need to have someone help you when you read instructions, pamphlets, or other written materials from your doctor or pharmacy?: 1 - Never   Diabetes:  Is the patient diabetic?  Yes  If diabetic, was a CBG obtained today?  No  Did the patient bring in their glucometer from home?  No  How often do you monitor your CBG's? Once a week.   Financial Strains and Diabetes Management:  Are you having any financial strains with the device, your supplies or your medication? No .  Does the patient want to be seen by Chronic Care Management for management of their diabetes?  No  Would the patient like to be referred to a Nutritionist or for Diabetic Management?  No   Diabetic Exams:  Diabetic Eye Exam: Completed 12/03/17. Overdue for diabetic eye exam. Pt has been advised about the importance in completing this exam. Next eye exam scheduled for April 13, 2020.  Diabetic Foot Exam: Completed 01/28/17. Pt has been advised about the importance in completing this exam. Note made to follow up on this at next in office apt.    Interpreter Needed?: No  Information entered by :: [facility], LPN  Past Medical History:  Diagnosis Date  . Allergy   . Anemia   . Arthritis   . Cardiomegaly   . CHF (congestive heart failure) ([place])   . Hyperlipidemia   . Hypertension   . Sleep apnea    MILD   Past Surgical History:  Procedure Laterality Date  .  BREAST EXCISIONAL BIOPSY Right 1976  . BREAST MASS EXCISION Right 1976   benign  . CATARACT EXTRACTION W/PHACO Left 07/29/2017   Procedure: CATARACT EXTRACTION PHACO AND INTRAOCULAR LENS PLACEMENT (IOC)-LEFT;  Surgeon: Birder Robson, MD;  Location: [facility] ORS;  Service: Ophthalmology;  Laterality: Left;  [place] 00:49 AP% 15.6 CDE 7.78 Fluid pack lot # [id number]  . CATARACT EXTRACTION W/PHACO Right 09/02/2017   Procedure: CATARACT  EXTRACTION PHACO AND INTRAOCULAR LENS PLACEMENT ([place]);  Surgeon: Birder Robson, MD;  Location: [facility] ORS;  Service: Ophthalmology;  Laterality: Right;  [place] 00:58.7 AP% 18.0 CDE 10.58 Fluid Pack lot # [id number]  . COLONOSCOPY  03-29-05   Dr [name]  . COLONOSCOPY N/A 02/01/2015   Procedure: COLONOSCOPY;  Surgeon: [name], MD;  Location: [facility] ENDOSCOPY;  Service: Endoscopy;  Laterality: N/A;  . venous closure procedure Right 07-25-05   Dr. [name]   Family History  Problem Relation Age of Onset  . Diabetes Father   . Ovarian cancer Sister   . Breast cancer Sister 27  . Healthy Sister   . Healthy Brother   . Healthy Sister   . Breast cancer Maternal Aunt    Social History   Socioeconomic History  . Marital status: Divorced    Spouse name: Not on file  . Number of children: 4  . Years of education: HS  . Highest education level: 12th grade  Occupational History  . Occupation: retired  Tobacco Use  . Smoking status: Never Smoker  . Smokeless tobacco: Never Used  Substance and Sexual Activity  . Alcohol use: No    Alcohol/week: 0.0 standard drinks  . Drug use: No  . Sexual activity: Yes    Birth control/protection: Post-menopausal    Comment: pt states, "I have a friend"  Other Topics Concern  . Not on file  Social History Narrative  . Not on file   Social Determinants of Health   Financial Resource Strain: Low Risk   . Difficulty of Paying Living Expenses: Not hard at all  Food Insecurity: No Food Insecurity  . Worried About Charity fundraiser in the Last Year: Never true  . Ran Out of Food in the Last Year: Never true  Transportation Needs: No Transportation Needs  . Lack of Transportation (Medical): No  . Lack of Transportation (Non-Medical): No  Physical Activity: Insufficiently Active  . Days of Exercise per Week: 4 days  . Minutes of Exercise per Session: 20 min  Stress: No Stress Concern Present  . Feeling of Stress : Only a little  Social  Connections: Somewhat Isolated  . Frequency of Communication with Friends and Family: More than three times a week  . Frequency of Social Gatherings with Friends and Family: More than three times a week  . Attends Religious Services: More than 4 times per year  . Active Member of Clubs or Organizations: No  . Attends Archivist Meetings: Never  . Marital Status: Divorced    Outpatient Encounter Medications as of 02/03/2020  Medication Sig  . aspirin EC 81 MG tablet Take 81 mg by mouth daily with supper.  Marland Kitchen Bioflavonoid Products (VITAMIN C) CHEW Chew 1 tablet by mouth 3 (three) times a week.  . Calcium Carb-Cholecalciferol (CALCIUM + D3 PO) Take 1 tablet by mouth 2 (two) times daily.  . carvedilol (COREG) 6.25 MG tablet Take 6.25 mg by mouth 2 (two) times daily.  Marland Kitchen losartan (COZAAR) 50 MG tablet Take 50 mg by mouth daily.   Marland Kitchen  lovastatin (MEVACOR) 40 MG tablet Take 40 mg by mouth at bedtime.   . Misc Natural Products (OSTEO BI-FLEX ADV JOINT SHIELD PO) Take 1 tablet by mouth 2 (two) times daily.   . Multiple Vitamin (MULTIVITAMIN WITH MINERALS) TABS tablet Take 1 tablet by mouth daily with lunch.  . spironolactone (ALDACTONE) 25 MG tablet Take 25 mg by mouth daily.  Marland Kitchen tretinoin (RETIN-A) 0.1 % cream APPLY 1 APPLICATION ON THE SKIN AT BEDTIME EVERY OTHER NIGHT.  Marland Kitchen vitamin B-12 (CYANOCOBALAMIN) 1000 MCG tablet Take 1,000 mcg by mouth daily.   No facility-administered encounter medications on file as of 02/03/2020.    Activities of Daily Living In your present state of health, do you have any difficulty performing the following activities: 02/03/2020  Hearing? N  Vision? N  Difficulty concentrating or making decisions? N  Walking or climbing stairs? N  Dressing or bathing? N  Doing errands, shopping? N  Preparing Food and eating ? N  Using the Toilet? N  In the past six months, have you accidently leaked urine? N  Do you have problems with loss of bowel control? N  Managing  your Medications? N  Managing your Finances? N  Housekeeping or managing your Housekeeping? N  Some recent data might be hidden    Patient Care Team: [name] as PCP - General (Family Medicine) Corey Skains, MD as Consulting Physician (Cardiology) [name], MD as Consulting Physician (Ophthalmology) Birder Robson, MD as Referring Physician (Ophthalmology)    Assessment:   This is a routine wellness examination for Jazz[place].  Exercise Activities and Dietary recommendations Current Exercise Habits: Home exercise routine, Type of exercise: treadmill;stretching;Other - see comments(exercise tape), Time (Minutes): 20, Frequency (Times/Week): 4, Weekly Exercise (Minutes/Week): 80, Intensity: Mild, Exercise limited by: None identified  Goals    . DIET - INCREASE WATER INTAKE     Recommend increasing water intake to 4-6 glasses a day.     Marland Kitchen HEMOGLOBIN A1C < 7.0       Fall Risk: Fall Risk  02/03/2020 02/02/2019 01/29/2018 01/28/2017 01/24/2016  Falls in the past year? 0 0 No No No  Number falls in past yr: 0 - - - -  Injury with Fall? 0 - - - -    FALL RISK PREVENTION PERTAINING TO THE HOME:  Any stairs in or around the home? Yes  If so, are there any without handrails? No   Home free of loose throw rugs in walkways, pet beds, electrical cords, etc? Yes  Adequate lighting in your home to reduce risk of falls? Yes   ASSISTIVE DEVICES UTILIZED TO PREVENT FALLS:  Life alert? No  Use of a cane, walker or w/c? No  Grab bars in the bathroom? Yes  Shower chair or bench in shower? No  Elevated toilet seat or a handicapped toilet? Yes   TIMED UP AND GO:  Was the test performed? No .    Depression Screen PHQ 2/9 Scores 02/03/2020 02/02/2019 02/02/2019 01/29/2018  PHQ - 2 Score 0 0 0 0  PHQ- 9 Score - 0 - -     Cognitive Function     6CIT Screen 02/03/2020 02/02/2019 01/29/2018  What Year? 0 points 0 points 0 points  What month? 0 points 0 points 0 points   What time? 0 points 0 points 0 points  Count back from 20 0 points 0 points 0 points  Months in reverse 0 points 0 points 0 points  Repeat phrase 0  points 2 points 2 points  Total Score 0 2 2    Immunization History  Administered Date(s) Administered  . Influenza, High Dose Seasonal PF 07/14/2014, 06/12/2015, 06/28/2017  . Influenza-Unspecified 07/06/2018  . Pneumococcal Conjugate-13 12/07/2014  . Pneumococcal Polysaccharide-23 09/26/2011  . Tdap 02/10/2006  . Zoster Recombinat (Shingrix) 09/11/2018, 12/08/2018    Qualifies for Shingles Vaccine? Completed series  Tdap: Although this vaccine is not a covered service during a Wellness Exam, does the patient still wish to receive this vaccine today?  No . Advised may receive this vaccine at local pharmacy or Health Dept. Aware to provide a copy of the vaccination record if obtained from local pharmacy or Health Dept. Verbalized acceptance and understanding.  Flu Vaccine: Due fall 2021.  Pneumococcal Vaccine: Completed series  Screening Tests Health Maintenance  Topic Date Due  . COVID-19 Vaccine (1) Never done  . FOOT EXAM  01/28/2018  . OPHTHALMOLOGY EXAM  12/04/2018  . HEMOGLOBIN A1C  08/20/2019  . TETANUS/TDAP  02/17/2020 (Originally 02/11/2016)  . INFLUENZA VACCINE  04/23/2020  . MAMMOGRAM  03/02/2021  . COLONOSCOPY  01/31/2025  . DEXA SCAN  Completed  . Hepatitis C Screening  Completed  . PNA vac Low Risk Adult  Completed    Cancer Screenings:  Colorectal Screening: Completed 02/01/15. Repeat every 10 years.   Mammogram: Completed 03/03/19. Repeat every 1-2 years as advised. Scheduled for 03/03/20.  Bone Density: Completed 12/21/14. Previous DEXA scan was normal. No repeat needed unless advised by a physician.  Lung Cancer Screening: (Low Dose CT Chest recommended if Age 57-80 years, 30 pack-year currently smoking OR have quit w/in 15years.) does not qualify.   Additional Screening:  Hepatitis C Screening: Up to  date  Dental Screening: Recommended annual dental exams for proper oral hygiene   Community Resource Referral:  CRR required this visit?  No       Plan:  I have personally reviewed and addressed the Medicare Annual Wellness questionnaire and have noted the following in the patient's chart:  A. Medical and social history B. Use of alcohol, tobacco or illicit drugs  C. Current medications and supplements D. Functional ability and status E.  Nutritional status F.  Physical activity G. Advance directives H. List of other physicians I.  Hospitalizations, surgeries, and ER visits in previous 12 months J.  [place] such as hearing and vision if needed, cognitive and depression L. Referrals and appointments   In addition, I have reviewed and discussed with patient certain preventive protocols, quality metrics, and best practice recommendations. A written personalized care plan for preventive services as well as general preventive health recommendations were provided to patient.   [place] Score, [place]  [id number] Nurse Health Advisor   Nurse Notes: Pt needs a diabetic foot exam and Hgb A1c check at tomorrows in office apt. Pt has an eye exam scheduled for 04/13/20. Requested records be faxed to office once completed.

## 2020-02-03 ENCOUNTER — Other Ambulatory Visit: Payer: Self-pay

## 2020-02-03 ENCOUNTER — Ambulatory Visit (INDEPENDENT_AMBULATORY_CARE_PROVIDER_SITE_OTHER): Payer: PPO

## 2020-02-03 DIAGNOSIS — Z0000 Encounter for general adult medical examination without abnormal findings: Secondary | ICD-10-CM

## 2020-02-03 NOTE — Patient Instructions (Signed)
Dawn Roberts , Thank you for taking time to come for your Medicare Wellness Visit. I appreciate your ongoing commitment to your health goals. Please review the following plan we discussed and let me know if I can assist you in the future.   Screening recommendations/referrals: Colonoscopy: Up to date, due 01/2025 Mammogram: Up to date, scheduled for 03/03/20 Bone Density: Up to date. Previous DEXA scan was normal. No repeat needed unless advised by a physician. Recommended yearly ophthalmology/optometry visit for glaucoma screening and checkup Recommended yearly dental visit for hygiene and checkup  Vaccinations: Influenza vaccine: Due fall 2021 Pneumococcal vaccine: Completed series Tdap vaccine: Pt declines today.  Shingles vaccine: Completed series    Advanced directives: Advance directive discussed with you today. Even though you declined this today please call our office should you change your mind and we can give you the proper paperwork for you to fill out.  Conditions/risks identified: Recommend to continue to increase water intake to 6-8 8 oz glasses a day.   Next appointment: 02/04/20 @ 8:00 AM with [place] 65 Years and Older, Female Preventive care refers to lifestyle choices and visits with your health care provider that can promote health and wellness. What does preventive care include?  A yearly physical exam. This is also called an annual well check.  Dental exams once or twice a year.  Routine eye exams. Ask your health care provider how often you should have your eyes checked.  Personal lifestyle choices, including:  Daily care of your teeth and gums.  Regular physical activity.  Eating a healthy diet.  Avoiding tobacco and drug use.  Limiting alcohol use.  Practicing safe sex.  Taking low-dose aspirin every day.  Taking vitamin and mineral supplements as recommended by your health care provider. What happens during an annual  well check? The services and screenings done by your health care provider during your annual well check will depend on your age, overall health, lifestyle risk factors, and family history of disease. Counseling  Your health care provider may ask you questions about your:  Alcohol use.  Tobacco use.  Drug use.  Emotional well-being.  Home and relationship well-being.  Sexual activity.  Eating habits.  History of falls.  Memory and ability to understand (cognition).  Work and work Statistician.  Reproductive health. Screening  You may have the following tests or measurements:  Height, weight, and BMI.  Blood pressure.  Lipid and cholesterol levels. These may be checked every 5 years, or more frequently if you are over 57 years old.  Skin check.  Lung cancer screening. You may have this screening every year starting at age 31 if you have a 30-pack-year history of smoking and currently smoke or have quit within the past 15 years.  Fecal occult blood test (FOBT) of the stool. You may have this test every year starting at age 30.  Flexible sigmoidoscopy or colonoscopy. You may have a sigmoidoscopy every 5 years or a colonoscopy every 10 years starting at age 69.  Hepatitis C blood test.  Hepatitis B blood test.  Sexually transmitted disease (STD) testing.  Diabetes screening. This is done by checking your blood sugar (glucose) after you have not eaten for a while (fasting). You may have this done every 1-3 years.  Bone density scan. This is done to screen for osteoporosis. You may have this done starting at age 48.  Mammogram. This may be done every 1-2 years. Talk to your health care  provider about how often you should have regular mammograms. Talk with your health care provider about your test results, treatment options, and if necessary, the need for more tests. Vaccines  Your health care provider may recommend certain vaccines, such as:  Influenza vaccine. This  is recommended every year.  Tetanus, diphtheria, and acellular pertussis (Tdap, Td) vaccine. You may need a Td booster every 10 years.  Zoster vaccine. You may need this after age 35.  Pneumococcal 13-valent conjugate (PCV13) vaccine. One dose is recommended after age 31.  Pneumococcal polysaccharide (PPSV23) vaccine. One dose is recommended after age 60. Talk to your health care provider about which screenings and vaccines you need and how often you need them. This information is not intended to replace advice given to you by your health care provider. Make sure you discuss any questions you have with your health care provider. Document Released: 10/06/2015 Document Revised: 05/29/2016 Document Reviewed: 07/11/2015 Elsevier Interactive Patient Education  2017 [place] Prevention in the Home Falls can cause injuries. They can happen to people of all ages. There are many things you can do to make your home safe and to help prevent falls. What can I do on the outside of my home?  Regularly fix the edges of walkways and driveways and fix any cracks.  Remove anything that might make you trip as you walk through a door, such as a raised step or threshold.  Trim any bushes or trees on the path to your home.  Use bright outdoor lighting.  Clear any walking paths of anything that might make someone trip, such as rocks or tools.  Regularly check to see if handrails are loose or broken. Make sure that both sides of any steps have handrails.  Any raised decks and porches should have guardrails on the edges.  Have any leaves, snow, or ice cleared regularly.  Use sand or salt on walking paths during winter.  Clean up any spills in your garage right away. This includes oil or grease spills. What can I do in the bathroom?  Use night lights.  Install grab bars by the toilet and in the tub and shower. Do not use towel bars as grab bars.  Use non-skid mats or decals in the tub or  shower.  If you need to sit down in the shower, use a plastic, non-slip stool.  Keep the floor dry. Clean up any water that spills on the floor as soon as it happens.  Remove soap buildup in the tub or shower regularly.  Attach bath mats securely with double-sided non-slip rug tape.  Do not have throw rugs and other things on the floor that can make you trip. What can I do in the bedroom?  Use night lights.  Make sure that you have a light by your bed that is easy to reach.  Do not use any sheets or blankets that are too big for your bed. They should not hang down onto the floor.  Have a firm chair that has side arms. You can use this for support while you get dressed.  Do not have throw rugs and other things on the floor that can make you trip. What can I do in the kitchen?  Clean up any spills right away.  Avoid walking on wet floors.  Keep items that you use a lot in easy-to-reach places.  If you need to reach something above you, use a strong step stool that has a grab bar.  Keep electrical cords out of the way.  Do not use floor polish or wax that makes floors slippery. If you must use wax, use non-skid floor wax.  Do not have throw rugs and other things on the floor that can make you trip. What can I do with my stairs?  Do not leave any items on the stairs.  Make sure that there are handrails on both sides of the stairs and use them. Fix handrails that are broken or loose. Make sure that handrails are as long as the stairways.  Check any carpeting to make sure that it is firmly attached to the stairs. Fix any carpet that is loose or worn.  Avoid having throw rugs at the top or bottom of the stairs. If you do have throw rugs, attach them to the floor with carpet tape.  Make sure that you have a light switch at the top of the stairs and the bottom of the stairs. If you do not have them, ask someone to add them for you. What else can I do to help prevent  falls?  Wear shoes that:  Do not have high heels.  Have rubber bottoms.  Are comfortable and fit you well.  Are closed at the toe. Do not wear sandals.  If you use a stepladder:  Make sure that it is fully opened. Do not climb a closed stepladder.  Make sure that both sides of the stepladder are locked into place.  Ask someone to hold it for you, if possible.  Clearly mark and make sure that you can see:  Any grab bars or handrails.  First and last steps.  Where the edge of each step is.  Use tools that help you move around (mobility aids) if they are needed. These include:  Canes.  Walkers.  Scooters.  Crutches.  Turn on the lights when you go into a dark area. Replace any light bulbs as soon as they burn out.  Set up your furniture so you have a clear path. Avoid moving your furniture around.  If any of your floors are uneven, fix them.  If there are any pets around you, be aware of where they are.  Review your medicines with your doctor. Some medicines can make you feel dizzy. This can increase your chance of falling. Ask your doctor what other things that you can do to help prevent falls. This information is not intended to replace advice given to you by your health care provider. Make sure you discuss any questions you have with your health care provider. Document Released: 07/06/2009 Document Revised: 02/15/2016 Document Reviewed: 10/14/2014 Elsevier Interactive Patient Education  2017 [employer].

## 2020-02-03 NOTE — Progress Notes (Signed)
Complete physical exam   Patient: Dawn Roberts   DOB: 1946/09/17   74 y.o. Female  MRN: [id number] Visit Date: 02/04/2020  Today's healthcare provider: Mar Daring, PA-C   Chief Complaint  Patient presents with  . Annual Exam   Subjective    Dawn Roberts is a 74 y.o. female who presents today for a complete physical exam.  She reports consuming a general diet. Home exercise routine includes treadmill. She generally feels well. She reports sleeping well. She does not have additional problems to discuss today.  HPI  She had AWV with [facility] 02/03/2020 Mammogram scheduled 03/03/2020 Colonoscopy-02/01/2015; normal repeat in 10 years Eye Exam: Scheduled 04/07/2020  Past Medical History:  Diagnosis Date  . Allergy   . Anemia   . Arthritis   . Cardiomegaly   . CHF (congestive heart failure) ([place])   . Hyperlipidemia   . Hypertension   . Sleep apnea    MILD   Past Surgical History:  Procedure Laterality Date  . BREAST EXCISIONAL BIOPSY Right 1976  . BREAST MASS EXCISION Right 1976   benign  . CATARACT EXTRACTION W/PHACO Left 07/29/2017   Procedure: CATARACT EXTRACTION PHACO AND INTRAOCULAR LENS PLACEMENT (IOC)-LEFT;  Surgeon: Birder Robson, MD;  Location: [facility] ORS;  Service: Ophthalmology;  Laterality: Left;  [place] 00:49 AP% 15.6 CDE 7.78 Fluid pack lot # [id number]  . CATARACT EXTRACTION W/PHACO Right 09/02/2017   Procedure: CATARACT EXTRACTION PHACO AND INTRAOCULAR LENS PLACEMENT ([place]);  Surgeon: Birder Robson, MD;  Location: [facility] ORS;  Service: Ophthalmology;  Laterality: Right;  [place] 00:58.7 AP% 18.0 CDE 10.58 Fluid Pack lot # [id number]  . COLONOSCOPY  03-29-05   Dr [name]  . COLONOSCOPY N/A 02/01/2015   Procedure: COLONOSCOPY;  Surgeon: [name], MD;  Location: [facility] ENDOSCOPY;  Service: Endoscopy;  Laterality: N/A;  . venous closure procedure Right 07-25-05   Dr. [name]   Social History   Socioeconomic History  . Marital status: Divorced      Spouse name: Not on file  . Number of children: 4  . Years of education: HS  . Highest education level: 12th grade  Occupational History  . Occupation: retired  Tobacco Use  . Smoking status: Never Smoker  . Smokeless tobacco: Never Used  Substance and Sexual Activity  . Alcohol use: No    Alcohol/week: 0.0 standard drinks  . Drug use: No  . Sexual activity: Yes    Birth control/protection: Post-menopausal    Comment: pt states, "I have a friend"  Other Topics Concern  . Not on file  Social History Narrative  . Not on file   Social Determinants of Health   Financial Resource Strain: Low Risk   . Difficulty of Paying Living Expenses: Not hard at all  Food Insecurity: No Food Insecurity  . Worried About Charity fundraiser in the Last Year: Never true  . Ran Out of Food in the Last Year: Never true  Transportation Needs: No Transportation Needs  . Lack of Transportation (Medical): No  . Lack of Transportation (Non-Medical): No  Physical Activity: Insufficiently Active  . Days of Exercise per Week: 4 days  . Minutes of Exercise per Session: 20 min  Stress: No Stress Concern Present  . Feeling of Stress : Only a little  Social Connections: Somewhat Isolated  . Frequency of Communication with Friends and Family: More than three times a week  . Frequency of Social Gatherings with Friends and Family: More than  three times a week  . Attends Religious Services: More than 4 times per year  . Active Member of Clubs or Organizations: No  . Attends Archivist Meetings: Never  . Marital Status: Divorced  Human resources officer Violence: Not At Risk  . Fear of Current or Ex-Partner: No  . Emotionally Abused: No  . Physically Abused: No  . Sexually Abused: No   Family Status  Relation Name Status  . Father  Deceased at age 34  . Sister  Deceased at age 50       ovarian cancer  . Mother  Deceased at age 30  . Sister  Alive  . Brother  Alive  . Sister  Alive  . Mat  Aunt  (Not Specified)   Family History  Problem Relation Age of Onset  . Diabetes Father   . Ovarian cancer Sister   . Breast cancer Sister 65  . Healthy Sister   . Healthy Brother   . Healthy Sister   . Breast cancer Maternal Aunt    No Known Allergies  Patient Care Team: Mar Daring, PA-C as PCP - General (Family Medicine) Corey Skains, MD as Consulting Physician (Cardiology) [name], MD as Consulting Physician (Ophthalmology) Birder Robson, MD as Referring Physician (Ophthalmology)   Medications: Outpatient Medications Prior to Visit  Medication Sig  . aspirin EC 81 MG tablet Take 81 mg by mouth daily with supper.  Marland Kitchen Bioflavonoid Products (VITAMIN C) CHEW Chew 1 tablet by mouth 3 (three) times a week.  . Calcium Carb-Cholecalciferol (CALCIUM + D3 PO) Take 1 tablet by mouth 2 (two) times daily.  . carvedilol (COREG) 6.25 MG tablet Take 6.25 mg by mouth 2 (two) times daily.  Marland Kitchen losartan (COZAAR) 50 MG tablet Take 50 mg by mouth daily.   Marland Kitchen lovastatin (MEVACOR) 40 MG tablet Take 40 mg by mouth at bedtime.   . Misc Natural Products (OSTEO BI-FLEX ADV JOINT SHIELD PO) Take 1 tablet by mouth 2 (two) times daily.   . Multiple Vitamin (MULTIVITAMIN WITH MINERALS) TABS tablet Take 1 tablet by mouth daily with lunch.  . spironolactone (ALDACTONE) 25 MG tablet Take 25 mg by mouth daily.  Marland Kitchen tretinoin (RETIN-A) 0.1 % cream APPLY 1 APPLICATION ON THE SKIN AT BEDTIME EVERY OTHER NIGHT.  Marland Kitchen vitamin B-12 (CYANOCOBALAMIN) 1000 MCG tablet Take 1,000 mcg by mouth daily.   No facility-administered medications prior to visit.    Review of Systems  Constitutional: Negative.   HENT: Negative.   Eyes: Negative.   Respiratory: Negative.   Cardiovascular: Negative.   Gastrointestinal: Negative.   Endocrine: Negative.   Genitourinary: Negative.   Musculoskeletal: Negative.   Skin: Negative.   Allergic/Immunologic: Negative.   Neurological: Negative.   Hematological:  Negative.   Psychiatric/Behavioral: Negative.       Objective    BP 120/69 (BP Location: Left Arm, Patient Position: Sitting, Cuff Size: Normal)   Pulse 66   Temp (!) 96.9 F (36.1 C) (Temporal)   Resp 16   Ht 5\' 6"  (1.676 m)   Wt 143 lb (64.9 kg)   BMI 23.08 kg/m    Physical Exam Vitals reviewed.  Constitutional:      General: She is not in acute distress.    Appearance: Normal appearance. She is well-developed and normal weight. She is not ill-appearing or diaphoretic.  HENT:     Head: Normocephalic and atraumatic.     Right Ear: Tympanic membrane, ear canal and external ear normal.  Left Ear: Tympanic membrane, ear canal and external ear normal.     Mouth/Throat:     Pharynx: No oropharyngeal exudate.  Eyes:     General: No scleral icterus.       Right eye: No discharge.        Left eye: No discharge.     Extraocular Movements: Extraocular movements intact.     Conjunctiva/sclera: Conjunctivae normal.     Pupils: Pupils are equal, round, and reactive to light.  Neck:     Thyroid: No thyromegaly.     Vascular: No carotid bruit or JVD.     Trachea: No tracheal deviation.  Cardiovascular:     Rate and Rhythm: Normal rate and regular rhythm.     Pulses: Normal pulses.     Heart sounds: Normal heart sounds. No murmur. No friction rub. No gallop.   Pulmonary:     Effort: Pulmonary effort is normal. No respiratory distress.     Breath sounds: Normal breath sounds. No wheezing or rales.  Chest:     Chest wall: No tenderness.  Abdominal:     General: Abdomen is flat. Bowel sounds are normal. There is no distension.     Palpations: Abdomen is soft. There is no mass.     Tenderness: There is no abdominal tenderness. There is no guarding or rebound.  Musculoskeletal:        General: No tenderness. Normal range of motion.     Cervical back: Normal range of motion and neck supple.     Right lower leg: No edema.     Left lower leg: No edema.  Lymphadenopathy:      Cervical: No cervical adenopathy.  Skin:    General: Skin is warm and dry.     Capillary Refill: Capillary refill takes less than 2 seconds.     Findings: No rash.  Neurological:     General: No focal deficit present.     Mental Status: She is alert and oriented to person, place, and time. Mental status is at baseline.  Psychiatric:        Mood and Affect: Mood normal.        Behavior: Behavior normal.        Thought Content: Thought content normal.        Judgment: Judgment normal.         Depression Screen  PHQ 2/9 Scores 02/03/2020 02/02/2019 02/02/2019  PHQ - 2 Score 0 0 0  PHQ- 9 Score - 0 -   Fall Risk  02/03/2020 02/02/2019 01/29/2018 01/28/2017 01/24/2016  Falls in the past year? 0 0 No No No  Number falls in past yr: 0 - - - -  Injury with Fall? 0 - - - -   6CIT Screen 02/03/2020 02/02/2019 01/29/2018  What Year? 0 points 0 points 0 points  What month? 0 points 0 points 0 points  What time? 0 points 0 points 0 points  Count back from 20 0 points 0 points 0 points  Months in reverse 0 points 0 points 0 points  Repeat phrase 0 points 2 points 2 points  Total Score 0 2 2     No results found for any visits on 02/04/20.  Assessment & Plan    Routine Health Maintenance and Physical Exam  Exercise Activities and Dietary recommendations Goals    . DIET - INCREASE WATER INTAKE     Recommend increasing water intake to 4-6 glasses a day.     Marland Kitchen  HEMOGLOBIN A1C < 7.0       Immunization History  Administered Date(s) Administered  . Influenza, High Dose Seasonal PF 07/14/2014, 06/12/2015, 06/28/2017  . Influenza-Unspecified 07/06/2018  . Pneumococcal Conjugate-13 12/07/2014  . Pneumococcal Polysaccharide-23 09/26/2011  . Tdap 02/10/2006  . Zoster Recombinat (Shingrix) 09/11/2018, 12/08/2018    Health Maintenance  Topic Date Due  . COVID-19 Vaccine (1) Never done  . FOOT EXAM  01/28/2018  . OPHTHALMOLOGY EXAM  12/04/2018  . HEMOGLOBIN A1C  08/20/2019  . TETANUS/TDAP   02/17/2020 (Originally 02/11/2016)  . INFLUENZA VACCINE  04/23/2020  . MAMMOGRAM  03/02/2021  . COLONOSCOPY  01/31/2025  . DEXA SCAN  Completed  . Hepatitis C Screening  Completed  . PNA vac Low Risk Adult  Completed    Discussed health benefits of physical activity, and encouraged her to engage in regular exercise appropriate for her age and condition.  1. Routine general medical examination at a health care facility Normal physical exam today. Will check labs as below and f/u pending lab results. If labs are stable and WNL she will not need to have these rechecked for one year at her next annual physical exam. She is to call the office in the meantime if she has any acute issue, questions or concerns. - CBC with Differential - Hemoglobin A1c  2. Screening for thyroid disorder Will check labs as below and f/u pending results. - TSH  3. Diabetes mellitus with no complication (HCC) Stable. Diet and exercise controlled. Will check labs as below and f/u pending results. - Hemoglobin A1c - Comprehensive metabolic panel - Lipid panel  4. Chronic systolic heart failure (HCC) Stable. Followed by Cardiology. Continue carvedilol 6.25mg  BID, losartan 50mg . Will check labs as below and f/u pending results. - Comprehensive metabolic panel  5. Essential (primary) hypertension Stable. Followed by Cardiology. Continue medications as noted above. Will check labs as below and f/u pending results. - Hemoglobin A1c - Comprehensive metabolic panel - Lipid panel  6. Abnormal liver enzymes H/O this. On Spironolactone. Will check labs as below and f/u pending results. - Comprehensive metabolic panel - Lipid panel  7. Combined fat and carbohydrate induced hyperlipemia Stable. Continue lovastatin 40mg . Will check labs as below and f/u pending results. - Hemoglobin A1c - Comprehensive metabolic panel - Lipid panel   No follow-ups on file.     Reynolds Bowl, PA-C, have reviewed all  documentation for this visit. The documentation on 02/04/20 for the exam, diagnosis, procedures, and orders are all accurate and complete.   [name]  [facility] [phone] (phone) [phone] (fax)  [place]

## 2020-02-04 ENCOUNTER — Other Ambulatory Visit: Payer: Self-pay

## 2020-02-04 ENCOUNTER — Encounter: Payer: Self-pay | Admitting: Physician Assistant

## 2020-02-04 ENCOUNTER — Ambulatory Visit (INDEPENDENT_AMBULATORY_CARE_PROVIDER_SITE_OTHER): Payer: PPO | Admitting: Physician Assistant

## 2020-02-04 VITALS — BP 120/69 | HR 66 | Temp 96.9°F | Resp 16 | Ht 66.0 in | Wt 143.0 lb

## 2020-02-04 DIAGNOSIS — Z0000 Encounter for general adult medical examination without abnormal findings: Secondary | ICD-10-CM | POA: Diagnosis not present

## 2020-02-04 DIAGNOSIS — E782 Mixed hyperlipidemia: Secondary | ICD-10-CM

## 2020-02-04 DIAGNOSIS — E119 Type 2 diabetes mellitus without complications: Secondary | ICD-10-CM | POA: Diagnosis not present

## 2020-02-04 DIAGNOSIS — R748 Abnormal levels of other serum enzymes: Secondary | ICD-10-CM | POA: Diagnosis not present

## 2020-02-04 DIAGNOSIS — Z1329 Encounter for screening for other suspected endocrine disorder: Secondary | ICD-10-CM | POA: Diagnosis not present

## 2020-02-04 DIAGNOSIS — I5022 Chronic systolic (congestive) heart failure: Secondary | ICD-10-CM | POA: Diagnosis not present

## 2020-02-04 DIAGNOSIS — I10 Essential (primary) hypertension: Secondary | ICD-10-CM | POA: Diagnosis not present

## 2020-02-04 NOTE — Patient Instructions (Signed)
Health Maintenance, Female Adopting a healthy lifestyle and getting preventive care are important in promoting health and wellness. Ask your health care provider about:  The right schedule for you to have regular tests and exams.  Things you can do on your own to prevent diseases and keep yourself healthy. What should I know about diet, weight, and exercise? Eat a healthy diet   Eat a diet that includes plenty of vegetables, fruits, low-fat dairy products, and lean protein.  Do not eat a lot of foods that are high in solid fats, added sugars, or sodium. Maintain a healthy weight Body mass index (BMI) is used to identify weight problems. It estimates body fat based on height and weight. Your health care provider can help determine your BMI and help you achieve or maintain a healthy weight. Get regular exercise Get regular exercise. This is one of the most important things you can do for your health. Most adults should:  Exercise for at least 150 minutes each week. The exercise should increase your heart rate and make you sweat (moderate-intensity exercise).  Do strengthening exercises at least twice a week. This is in addition to the moderate-intensity exercise.  Spend less time sitting. Even light physical activity can be beneficial. Watch cholesterol and blood lipids Have your blood tested for lipids and cholesterol at 75 years of age, then have this test every 5 years. Have your cholesterol levels checked more often if:  Your lipid or cholesterol levels are high.  You are older than 75 years of age.  You are at high risk for heart disease. What should I know about cancer screening? Depending on your health history and family history, you may need to have cancer screening at various ages. This may include screening for:  Breast cancer.  Cervical cancer.  Colorectal cancer.  Skin cancer.  Lung cancer. What should I know about heart disease, diabetes, and high blood  pressure? Blood pressure and heart disease  High blood pressure causes heart disease and increases the risk of stroke. This is more likely to develop in people who have high blood pressure readings, are of African descent, or are overweight.  Have your blood pressure checked: ? Every 3-5 years if you are 18-39 years of age. ? Every year if you are 40 years old or older. Diabetes Have regular diabetes screenings. This checks your fasting blood sugar level. Have the screening done:  Once every three years after age 40 if you are at a normal weight and have a low risk for diabetes.  More often and at a younger age if you are overweight or have a high risk for diabetes. What should I know about preventing infection? Hepatitis B If you have a higher risk for hepatitis B, you should be screened for this virus. Talk with your health care provider to find out if you are at risk for hepatitis B infection. Hepatitis C Testing is recommended for:  Everyone born from 1945 through 1965.  Anyone with known risk factors for hepatitis C. Sexually transmitted infections (STIs)  Get screened for STIs, including gonorrhea and chlamydia, if: ? You are sexually active and are younger than 75 years of age. ? You are older than 75 years of age and your health care provider tells you that you are at risk for this type of infection. ? Your sexual activity has changed since you were last screened, and you are at increased risk for chlamydia or gonorrhea. Ask your health care provider if   you are at risk.  Ask your health care provider about whether you are at high risk for HIV. Your health care provider may recommend a prescription medicine to help prevent HIV infection. If you choose to take medicine to prevent HIV, you should first get tested for HIV. You should then be tested every 3 months for as long as you are taking the medicine. Pregnancy  If you are about to stop having your period (premenopausal) and  you may become pregnant, seek counseling before you get pregnant.  Take 400 to 800 micrograms (mcg) of folic acid every day if you become pregnant.  Ask for birth control (contraception) if you want to prevent pregnancy. Osteoporosis and menopause Osteoporosis is a disease in which the bones lose minerals and strength with aging. This can result in bone fractures. If you are 65 years old or older, or if you are at risk for osteoporosis and fractures, ask your health care provider if you should:  Be screened for bone loss.  Take a calcium or vitamin D supplement to lower your risk of fractures.  Be given hormone replacement therapy (HRT) to treat symptoms of menopause. Follow these instructions at home: Lifestyle  Do not use any products that contain nicotine or tobacco, such as cigarettes, e-cigarettes, and chewing tobacco. If you need help quitting, ask your health care provider.  Do not use street drugs.  Do not share needles.  Ask your health care provider for help if you need support or information about quitting drugs. Alcohol use  Do not drink alcohol if: ? Your health care provider tells you not to drink. ? You are pregnant, may be pregnant, or are planning to become pregnant.  If you drink alcohol: ? Limit how much you use to 0-1 drink a day. ? Limit intake if you are breastfeeding.  Be aware of how much alcohol is in your drink. In the [place] one drink equals one 12 oz bottle of beer (355 mL), one 5 oz glass of wine (148 mL), or one 1 oz glass of hard liquor (44 mL). General instructions  Schedule regular health, dental, and eye exams.  Stay current with your vaccines.  Tell your health care provider if: ? You often feel depressed. ? You have ever been abused or do not feel safe at home. Summary  Adopting a healthy lifestyle and getting preventive care are important in promoting health and wellness.  Follow your health care provider's instructions about healthy  diet, exercising, and getting tested or screened for diseases.  Follow your health care provider's instructions on monitoring your cholesterol and blood pressure. This information is not intended to replace advice given to you by your health care provider. Make sure you discuss any questions you have with your health care provider. Document Revised: 09/02/2018 Document Reviewed: 09/02/2018 Elsevier Patient Education  2020 Elsevier Inc.  

## 2020-02-05 LAB — COMPREHENSIVE METABOLIC PANEL
ALT: 21 IU/L (ref 0–32)
AST: 22 IU/L (ref 0–40)
Albumin/Globulin Ratio: 1.7 (ref 1.2–2.2)
Albumin: 4.4 g/dL (ref 3.7–4.7)
Alkaline Phosphatase: 47 IU/L (ref 39–117)
BUN/Creatinine Ratio: 24 (ref 12–28)
BUN: 29 mg/dL — ABNORMAL HIGH (ref 8–27)
Bilirubin Total: 0.3 mg/dL (ref 0.0–1.2)
CO2: 24 mmol/L (ref 20–29)
Calcium: 10.5 mg/dL — ABNORMAL HIGH (ref 8.7–10.3)
Chloride: 102 mmol/L (ref 96–106)
Creatinine, Ser: 1.22 mg/dL — ABNORMAL HIGH (ref 0.57–1.00)
GFR calc Af Amer: 50 mL/min/{1.73_m2} — ABNORMAL LOW (ref >59)
GFR calc non Af Amer: 44 mL/min/{1.73_m2} — ABNORMAL LOW (ref >59)
Globulin, Total: 2.6 g/dL (ref 1.5–4.5)
Glucose: 103 mg/dL — ABNORMAL HIGH (ref 65–99)
Potassium: 4.2 mmol/L (ref 3.5–5.2)
Sodium: 140 mmol/L (ref 134–144)
Total Protein: 7 g/dL (ref 6.0–8.5)

## 2020-02-05 LAB — CBC WITH DIFFERENTIAL/PLATELET
Basophils Absolute: 0 10*3/uL (ref 0.0–0.2)
Basos: 1 %
EOS (ABSOLUTE): 0.1 10*3/uL (ref 0.0–0.4)
Eos: 2 %
Hematocrit: 35.4 % (ref 34.0–46.6)
Hemoglobin: 11.7 g/dL (ref 11.1–15.9)
Immature Grans (Abs): 0 10*3/uL (ref 0.0–0.1)
Immature Granulocytes: 0 %
Lymphocytes Absolute: 2.2 10*3/uL (ref 0.7–3.1)
Lymphs: 40 %
MCH: 29.9 pg (ref 26.6–33.0)
MCHC: 33.1 g/dL (ref 31.5–35.7)
MCV: 91 fL (ref 79–97)
Monocytes Absolute: 0.5 10*3/uL (ref 0.1–0.9)
Monocytes: 9 %
Neutrophils Absolute: 2.7 10*3/uL (ref 1.4–7.0)
Neutrophils: 48 %
Platelets: 250 10*3/uL (ref 150–450)
RBC: 3.91 x10E6/uL (ref 3.77–5.28)
RDW: 12.1 % (ref 11.7–15.4)
WBC: 5.5 10*3/uL (ref 3.4–10.8)

## 2020-02-05 LAB — LIPID PANEL
Chol/HDL Ratio: 2.4 ratio (ref 0.0–4.4)
Cholesterol, Total: 120 mg/dL (ref 100–199)
HDL: 50 mg/dL (ref >39)
LDL Chol Calc (NIH): 58 mg/dL (ref 0–99)
Triglycerides: 52 mg/dL (ref 0–149)
VLDL Cholesterol Cal: 12 mg/dL (ref 5–40)

## 2020-02-05 LAB — TSH: TSH: 1.2 u[IU]/mL (ref 0.450–4.500)

## 2020-02-05 LAB — HEMOGLOBIN A1C
Est. average glucose Bld gHb Est-mCnc: 128 mg/dL
Hgb A1c MFr Bld: 6.1 % — ABNORMAL HIGH (ref 4.8–5.6)

## 2020-02-11 ENCOUNTER — Telehealth: Payer: Self-pay

## 2020-02-11 NOTE — Telephone Encounter (Signed)
Can you review her labs for me please?

## 2020-02-11 NOTE — Telephone Encounter (Signed)
Copied from [place] [id number]. Topic: General - Other >> Feb 11, 2020  9:48 AM Rainey Pines A wrote: Patient would like [facility] nurse to call and go over lab resutls. Please advise

## 2020-02-11 NOTE — Telephone Encounter (Signed)
Her a1c is well controlled. Cholesterol well controlled. Kidney function is just slightly worse than last time, could be a little dehydration. Tawanna Sat can recheck at her follow up after pushing fluids.

## 2020-02-11 NOTE — Telephone Encounter (Signed)
Patient was advised and scheduled appointment with Tawanna Sat on 02/18/2020.

## 2020-02-15 ENCOUNTER — Telehealth: Payer: Self-pay

## 2020-02-15 NOTE — Telephone Encounter (Signed)
This appt can be canceled

## 2020-02-15 NOTE — Telephone Encounter (Signed)
Pt returned the call and was given the message from Fenton Malling, PA-C dated 02/15/2020 at 11:47 AM regarding her lab results.  She wants to know if she needs to keep the follow up appt with Anderson Malta on 02/18/2020 at 11:20.   I let her know I would send a note to find out and someone will contact her with the answer.   She was agreeable to this plan.  I sent my notes to [facility].

## 2020-02-15 NOTE — Telephone Encounter (Signed)
LMTCB-If patient calls back [facility] for the [facility] nurse to give results.

## 2020-02-15 NOTE — Telephone Encounter (Signed)
-----   Message from Mar Daring, [place] sent at 02/15/2020 11:47 AM EDT ----- Blood count is normal. A1c has increased from 5.9 to now 6.1. Kidney function has declined just slightly. Make sure to push fluids. Sodium, potassium and calcium are normal. Cholesterol is normal. Thyroid is normal.

## 2020-02-15 NOTE — Telephone Encounter (Signed)
Pt advised.  Apt canceled.   Thanks,   -Mickel Baas

## 2020-02-18 ENCOUNTER — Ambulatory Visit: Payer: Self-pay | Admitting: Physician Assistant

## 2020-02-28 ENCOUNTER — Telehealth: Payer: Self-pay | Admitting: Physician Assistant

## 2020-02-28 NOTE — Chronic Care Management (AMB) (Signed)
  Chronic Care Management   Note  02/28/2020 Name: Dawn Roberts MRN: [id number] DOB: 12-30-1945  Dawn Roberts is a 74 y.o. year old female who is a primary care patient of [name]. I reached out to Acey Lav by phone today in response to a referral sent by Ms. Oda Kilts Batres's health plan.     Ms. Horgan was given information about Chronic Care Management services today including:  1. CCM service includes personalized support from designated clinical staff supervised by her physician, including individualized plan of care and coordination with other care providers 2. 24/7 contact phone numbers for assistance for urgent and routine care needs. 3. Service will only be billed when office clinical staff spend 20 minutes or more in a month to coordinate care. 4. Only one practitioner may furnish and bill the service in a calendar month. 5. The patient may stop CCM services at any time (effective at the end of the month) by phone call to the office staff. 6. The patient will be responsible for cost sharing (co-pay) of up to 20% of the service fee (after annual deductible is met).  Patient agreed to services and verbal consent obtained.   Follow up plan: Telephone appointment with care management team member scheduled for:03/23/2020  [address], [place], [place] [id number] Direct Dial: [email or web address] Website: [email or web address]

## 2020-03-03 ENCOUNTER — Telehealth: Payer: Self-pay

## 2020-03-03 ENCOUNTER — Ambulatory Visit
Admission: RE | Admit: 2020-03-03 | Discharge: 2020-03-03 | Disposition: A | Discharge status: Home / Self Care | Payer: PPO | Source: Ambulatory Visit | Attending: Physician Assistant | Admitting: Physician Assistant

## 2020-03-03 DIAGNOSIS — Z1231 Encounter for screening mammogram for malignant neoplasm of breast: Secondary | ICD-10-CM | POA: Insufficient documentation

## 2020-03-03 NOTE — Telephone Encounter (Signed)
-----   Message from Mar Daring, [place] sent at 03/03/2020  5:57 PM EDT ----- Normal mammogram. Repeat screening in one year.

## 2020-03-03 NOTE — Telephone Encounter (Signed)
Patient advised as directed below. 

## 2020-03-11 IMAGING — MG DIGITAL SCREENING BILATERAL MAMMOGRAM WITH TOMO AND CAD
8 series · 8 of 24 positions shown · non-contrast
Comparison: Previous exam(s).

CLINICAL DATA: Screening.

EXAM:
DIGITAL SCREENING BILATERAL MAMMOGRAM WITH TOMO AND CAD

[L MLO synth-2D]
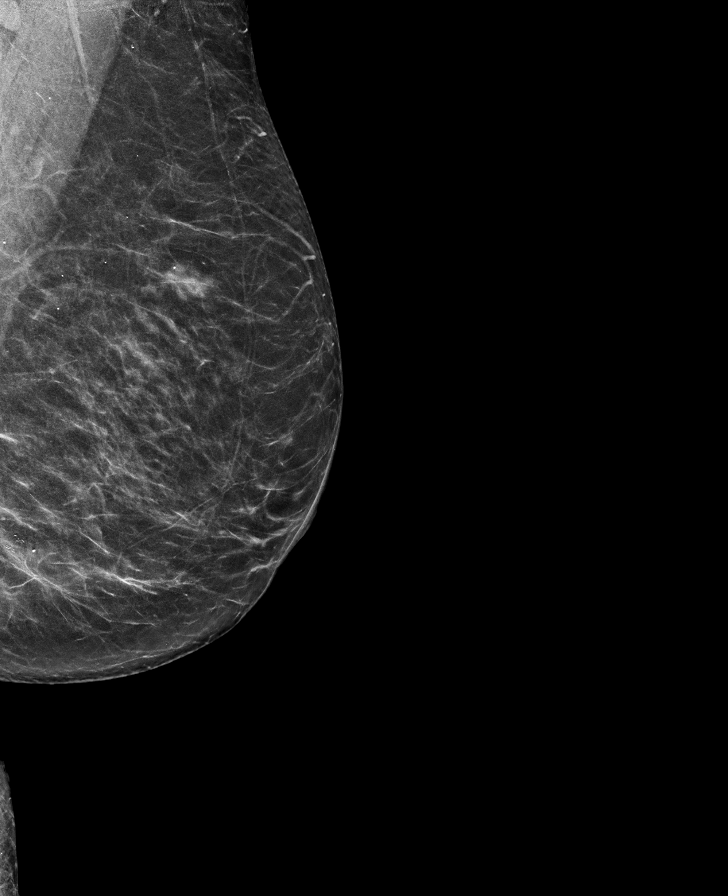

[R MLO synth-2D]
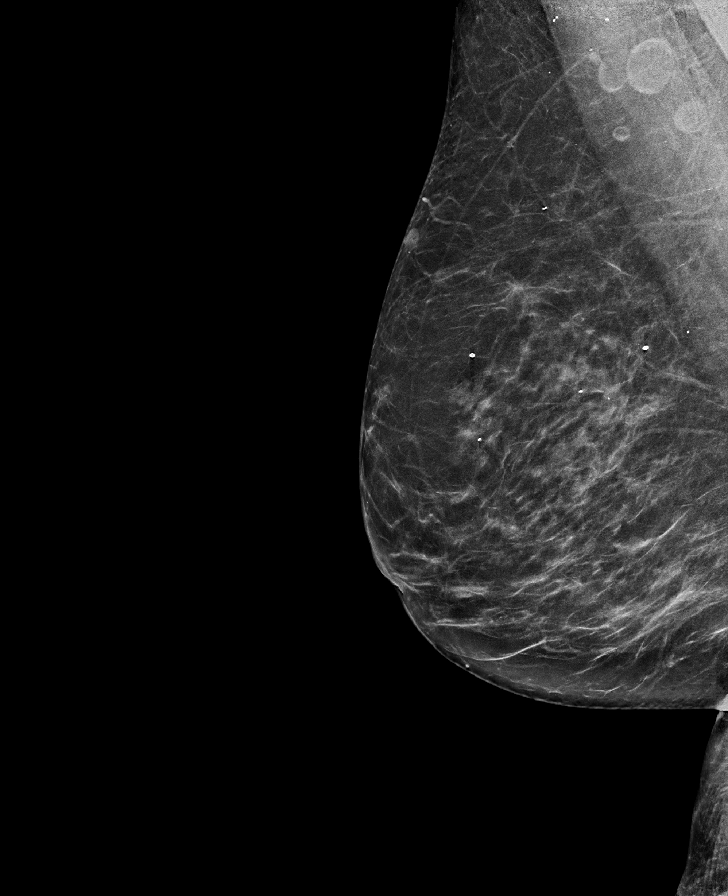

[L CC synth-2D]
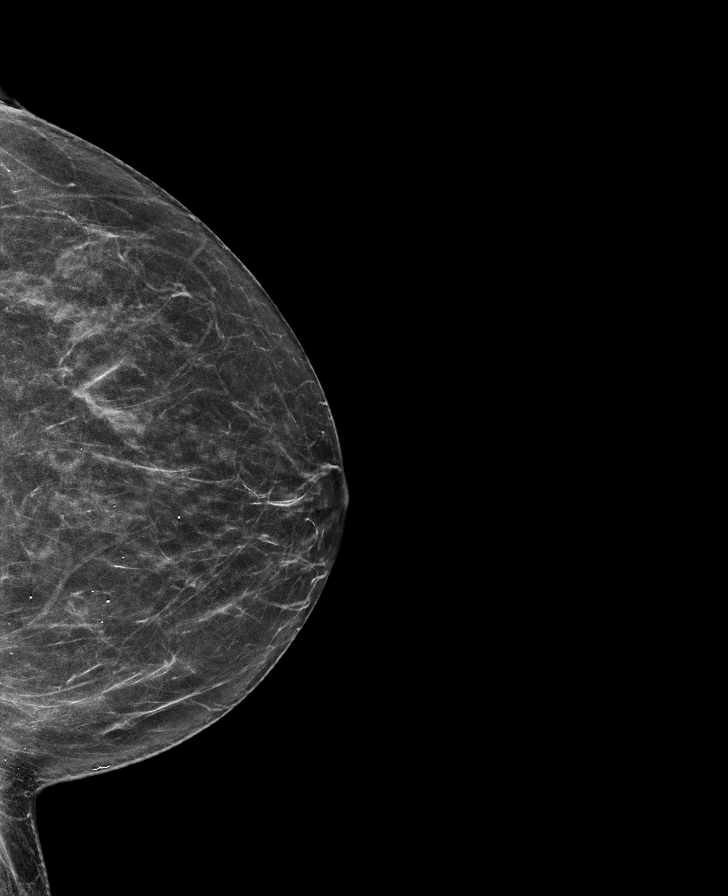

[R CC synth-2D]
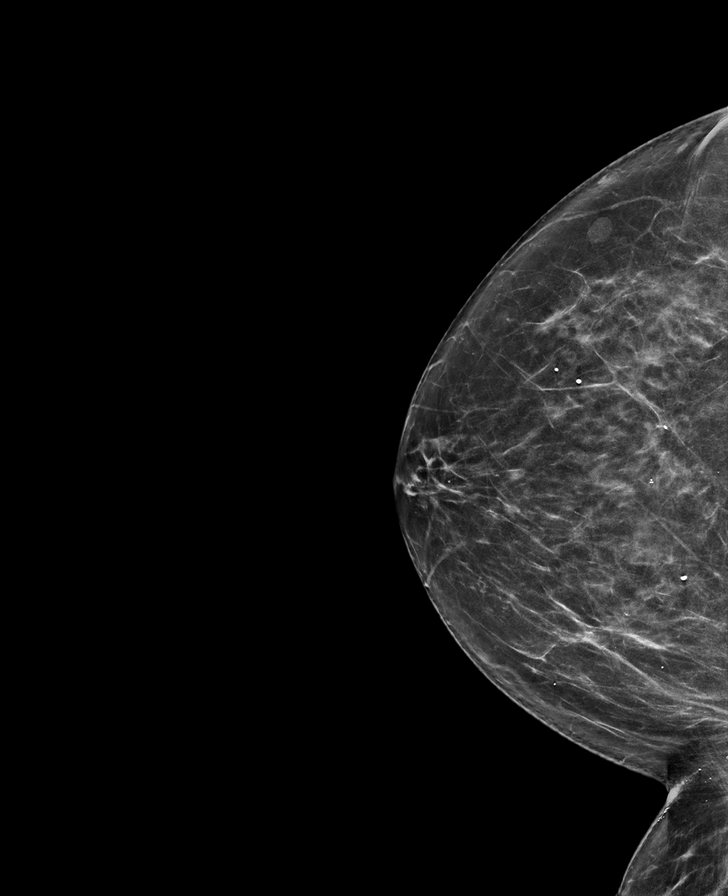

[L CC tomo · tomo slice 33/64.0]
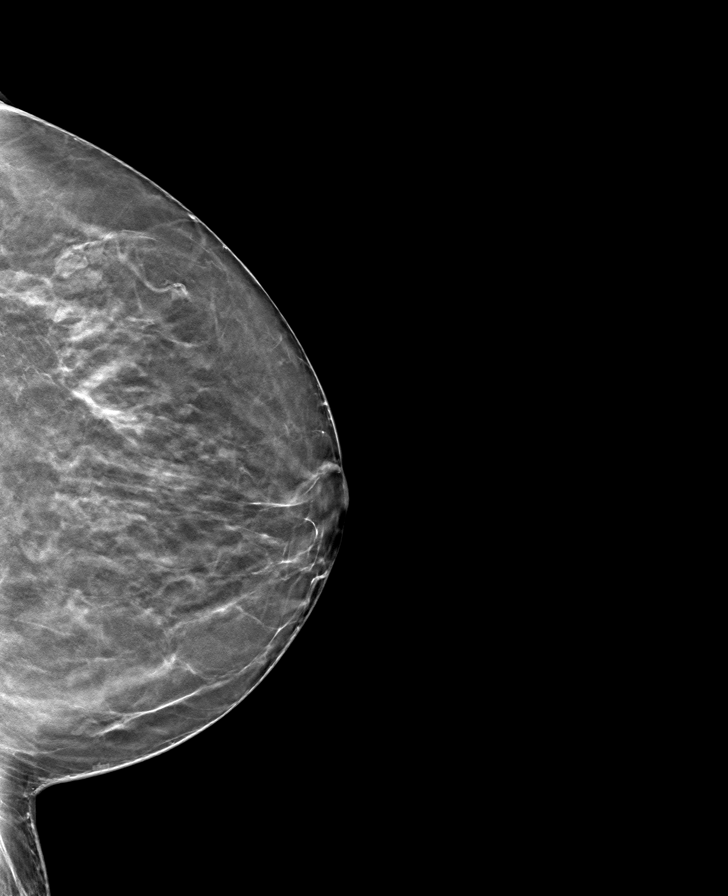

[L MLO tomo · tomo slice 34/67.0]
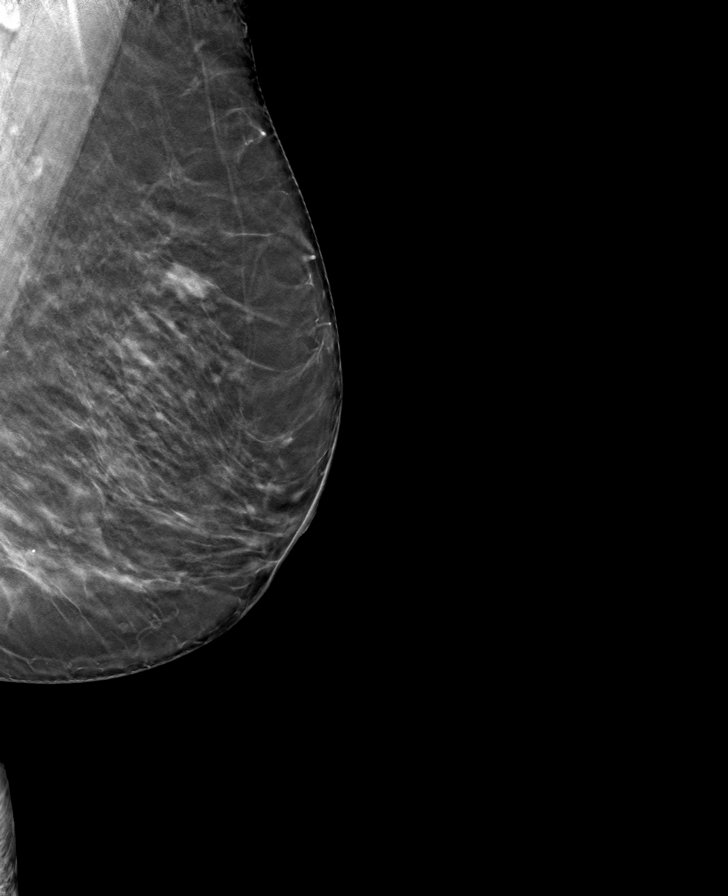

[R CC tomo · tomo slice 35/68.0]
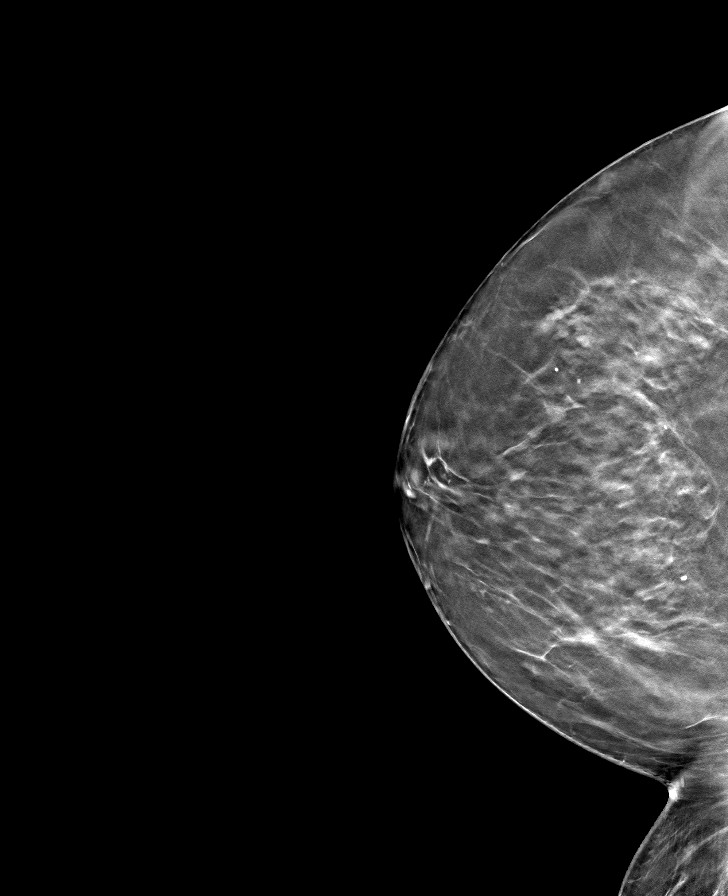

[R MLO tomo · tomo slice 35/68.0]
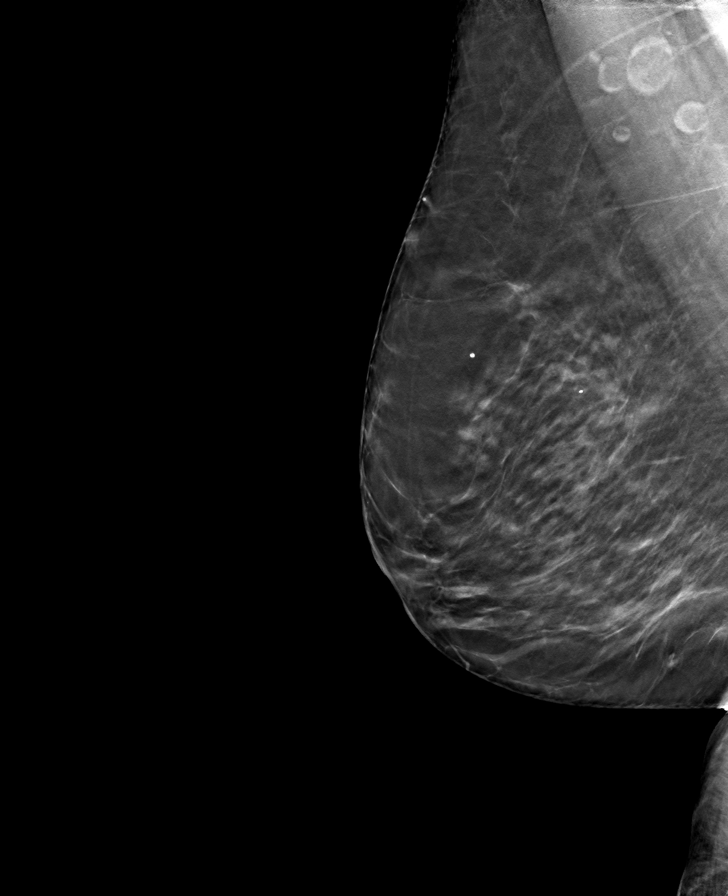

[8 of 24 positions shown; findings below may reference images not displayed]

ACR Breast Density Category c: The breast tissue is heterogeneously
dense, which may obscure small masses.
FINDINGS: There are no findings suspicious for malignancy. Images were
processed with CAD.
IMPRESSION: No mammographic evidence of malignancy. A result letter of this
screening mammogram will be mailed directly to the patient.

RECOMMENDATION:
Screening mammogram in one year. (Code:[id number])

BI-RADS CATEGORY  1: Negative.

## 2020-03-23 ENCOUNTER — Ambulatory Visit (INDEPENDENT_AMBULATORY_CARE_PROVIDER_SITE_OTHER): Payer: PPO

## 2020-03-23 DIAGNOSIS — I5022 Chronic systolic (congestive) heart failure: Secondary | ICD-10-CM | POA: Diagnosis not present

## 2020-03-23 DIAGNOSIS — I10 Essential (primary) hypertension: Secondary | ICD-10-CM | POA: Diagnosis not present

## 2020-03-23 DIAGNOSIS — E119 Type 2 diabetes mellitus without complications: Secondary | ICD-10-CM | POA: Diagnosis not present

## 2020-03-23 NOTE — Chronic Care Management (AMB) (Signed)
  Chronic Care Management   Initial Visit Note  03/23/2020 Name: Dawn Roberts MRN: [id number] DOB: March 19, 1946  Primary Care Provider: Mar Daring, PA-C Reason for referral : Chronic Care Management  Dawn Roberts is a 74 y.o. year old female who is a primary care patient of Dawn Roberts. The CCM team was consulted for assistance with chronic disease management and care coordination. A telephonic assessment was conducted today.  Review of Dawn Roberts status, including review of consultants reports, relevant labs and test results was conducted today. Collaboration with appropriate care team members was performed as part of the comprehensive evaluation and provision of chronic care management services.    SDOH (Social Determinants of Health) assessments performed: Yes No interventions indicated.    Medications: Outpatient Encounter Medications as of 03/23/2020  Medication Sig  . aspirin EC 81 MG tablet Take 81 mg by mouth daily with supper.  Marland Kitchen Bioflavonoid Products (VITAMIN C) CHEW Chew 1 tablet by mouth 3 (three) times a week.  . Calcium Carb-Cholecalciferol (CALCIUM + D3 PO) Take 1 tablet by mouth 2 (two) times daily.  . carvedilol (COREG) 6.25 MG tablet Take 6.25 mg by mouth 2 (two) times daily.  Marland Kitchen losartan (COZAAR) 50 MG tablet Take 50 mg by mouth daily.   Marland Kitchen lovastatin (MEVACOR) 40 MG tablet Take 40 mg by mouth at bedtime.   . Misc Natural Products (OSTEO BI-FLEX ADV JOINT SHIELD PO) Take 1 tablet by mouth 2 (two) times daily.   . Multiple Vitamin (MULTIVITAMIN WITH MINERALS) TABS tablet Take 1 tablet by mouth daily with lunch.  . spironolactone (ALDACTONE) 25 MG tablet Take 25 mg by mouth daily.  Marland Kitchen tretinoin (RETIN-A) 0.1 % cream APPLY 1 APPLICATION ON THE SKIN AT BEDTIME EVERY OTHER NIGHT.  Marland Kitchen vitamin B-12 (CYANOCOBALAMIN) 1000 MCG tablet Take 1,000 mcg by mouth daily.   No facility-administered encounter medications on file as of 03/23/2020.      Objective:  BP Readings from Last 3 Encounters:  02/04/20 120/69  02/17/19 110/60  01/04/19 117/71   Lab Results  Component Value Date   HGBA1C 6.1 (H) 02/04/2020    Lab Results  Component Value Date   CHOL 120 02/04/2020   HDL 50 02/04/2020   LDLCALC 58 02/04/2020   TRIG 52 02/04/2020   CHOLHDL 2.4 02/04/2020      Dawn Roberts was given information about Chronic Care Management services  including:  1. CCM service includes personalized support from designated clinical staff supervised by her physician, including individualized plan of care and coordination with other care providers 2. 24/7 contact phone numbers for assistance for urgent and routine care needs. 3. Service will only be billed when office clinical staff spend 20 minutes or more in a month to coordinate care. 4. Only one practitioner may furnish and bill the service in a calendar month. 5. The patient may stop CCM services at any time (effective at the end of the month) by phone call to the office staff. 6. The patient will be responsible for cost sharing (co-pay) of up to 20% of the service fee (after annual deductible is met).  Patient agreed to services and verbal consent obtained.    PLAN The care management team will follow-up with Dawn Roberts within the next two to three months.   Horris Latino [facility] Care Management [phone]

## 2020-04-09 NOTE — Patient Instructions (Signed)
      Thank you for allowing the Chronic Care Management team to participate in your care.  Goals Addressed            This Visit's Progress   . Chronic Disease Management       CARE PLAN ENTRY (see longitudinal plan of care for additional care plan information)  Current Barriers:  . Chronic Disease Management support and education needs related to CHF, HTN and DM.  Case Manager Clinical Goal(s):  Over the next 90 days, patient will: . Continue taking medications as prescribed. . Continue attending medical appointments as scheduled. . Monitor blood pressure and record readings. . Continue adherence with recommended diabetic/heart healthy diet.   Interventions:  . Inter-disciplinary care team collaboration (see longitudinal plan of care) . Reviewed medications. Advised to continue taking medications as prescribed and notify care management team with concerns regarding prescription cost. . Discussed established BP parameters and indications for notifying a provider. Encouraged to monitor and record readings. . Discussed complications of CHF and indications for seeking medical attention. Reports doing well. Denies shortness of breath, edema, chest pain or palpitations. Reports completing daily activities without difficulty. Monitors weight as recommended. Reports current weight of  143 lbs. . Discussed current nutritional intake. Reports not monitoring blood sugars d/t good diet control. Reports adhering to recommended hear healthy/diabetic diet. . Reviewed pending appointments. Encouraged to attend medical appointments as scheduled to prevent delays in care. Encouraged to notify care management team with concerns regarding transportation. . Discussed plan for ongoing care management and follow up. Provided direct contact information for care management team.   Patient Self Care Activities:  . Self administers medications  . Attends scheduled provider appointments . Calls pharmacy  for medication refills . Performs ADL's independently . Performs IADL's independently . Calls provider office for new concerns or questions   Initial goal documentation        Ms. Orsini was given information about Chronic Care Management services  including:  1. CCM service includes personalized support from designated clinical staff supervised by her physician, including individualized plan of care and coordination with other care providers 2. 24/7 contact phone numbers for assistance for urgent and routine care needs. 3. Service will only be billed when office clinical staff spend 20 minutes or more in a month to coordinate care. 4. Only one practitioner may furnish and bill the service in a calendar month. 5. The patient may stop CCM services at any time (effective at the end of the month) by phone call to the office staff. 6. The patient will be responsible for cost sharing (co-pay) of up to 20% of the service fee (after annual deductible is met).  Patient agreed to services and verbal consent obtained.   Ms. Kernen verbalized understanding of the information discussed during the telephonic outreach today. Declined need for a mailed/printed copy of the instructions.   The care management team will follow-up with Ms. Oconnor within the next two to three months.   Horris Latino [facility] Care Management [phone]

## 2020-04-13 DIAGNOSIS — H26491 Other secondary cataract, right eye: Secondary | ICD-10-CM | POA: Diagnosis not present

## 2020-04-27 DIAGNOSIS — H26492 Other secondary cataract, left eye: Secondary | ICD-10-CM | POA: Diagnosis not present

## 2020-05-09 DIAGNOSIS — I10 Essential (primary) hypertension: Secondary | ICD-10-CM | POA: Diagnosis not present

## 2020-05-09 DIAGNOSIS — I420 Dilated cardiomyopathy: Secondary | ICD-10-CM | POA: Diagnosis not present

## 2020-05-09 DIAGNOSIS — E782 Mixed hyperlipidemia: Secondary | ICD-10-CM | POA: Diagnosis not present

## 2020-05-09 DIAGNOSIS — I5022 Chronic systolic (congestive) heart failure: Secondary | ICD-10-CM | POA: Diagnosis not present

## 2020-06-06 ENCOUNTER — Telehealth: Payer: Self-pay

## 2020-06-06 NOTE — Telephone Encounter (Signed)
°  Chronic Care Management   Outreach Note  06/06/2020 Name: Dawn Roberts MRN: [id number] DOB: 12/10/1945  Primary Care Provider: Mar Daring, PA-C Reason for referral : Chronic Care Management     An unsuccessful telephone outreach was attempted today. [name] is currently enrolled in the chronic care management program.  A HIPAA compliant voice message was left today requesting a return call.    Follow Up Plan:  A member of the care management team will attempt to reach [name] again within the next two weeks.    [facility] Care [facility] [phone]

## 2020-06-08 ENCOUNTER — Ambulatory Visit: Payer: Self-pay

## 2020-06-19 NOTE — Chronic Care Management (AMB) (Signed)
  Chronic Care Management   Note   Name: Dawn Roberts MRN: [id number] DOB: Feb 19, 1946    Call returned from Dawn Roberts. Anticipates being available for care management outreach on 06/20/20. Will schedule follow-up for that time. Encouraged to contact the team if her availability changes.     Follow up plan: The care management team will follow-up with Dawn Roberts as requested on 06/20/20.    [facility] Care [facility] [phone]

## 2020-06-20 ENCOUNTER — Ambulatory Visit (INDEPENDENT_AMBULATORY_CARE_PROVIDER_SITE_OTHER): Payer: PPO

## 2020-06-20 DIAGNOSIS — I5022 Chronic systolic (congestive) heart failure: Secondary | ICD-10-CM | POA: Diagnosis not present

## 2020-06-20 DIAGNOSIS — I10 Essential (primary) hypertension: Secondary | ICD-10-CM | POA: Diagnosis not present

## 2020-06-20 NOTE — Chronic Care Management (AMB) (Signed)
Chronic Care Management   Follow Up Note   06/20/2020 Name: Dawn Roberts MRN: [id number] DOB: November 25, 1945  Primary Care Provider: Mar Daring, PA-C Reason for referral : Chronic Care Management   Dawn Roberts is a 74 y.o. year old female who is a primary care patient of [name]. She is currently engaged with the chronic care management team. A routine telephonic outreach was conducted today.    Review of Dawn Roberts status, including review of consultants reports, relevant labs and test results was conducted today. Collaboration with appropriate care team members was performed as part of the comprehensive evaluation and provision of chronic care management services.      SDOH (Social Determinants of Health) assessments performed: No   Outpatient Encounter Medications as of 06/20/2020  Medication Sig  . aspirin EC 81 MG tablet Take 81 mg by mouth daily with supper.  Marland Kitchen Bioflavonoid Products (VITAMIN C) CHEW Chew 1 tablet by mouth 3 (three) times a week.  . Calcium Carb-Cholecalciferol (CALCIUM + D3 PO) Take 1 tablet by mouth 2 (two) times daily.  . carvedilol (COREG) 6.25 MG tablet Take 6.25 mg by mouth 2 (two) times daily.  Marland Kitchen losartan (COZAAR) 50 MG tablet Take 50 mg by mouth daily.   Marland Kitchen lovastatin (MEVACOR) 40 MG tablet Take 40 mg by mouth at bedtime.   . Misc Natural Products (OSTEO BI-FLEX ADV JOINT SHIELD PO) Take 1 tablet by mouth 2 (two) times daily.   . Multiple Vitamin (MULTIVITAMIN WITH MINERALS) TABS tablet Take 1 tablet by mouth daily with lunch.  . spironolactone (ALDACTONE) 25 MG tablet Take 25 mg by mouth daily.  Marland Kitchen tretinoin (RETIN-A) 0.1 % cream APPLY 1 APPLICATION ON THE SKIN AT BEDTIME EVERY OTHER NIGHT.  Marland Kitchen vitamin B-12 (CYANOCOBALAMIN) 1000 MCG tablet Take 1,000 mcg by mouth daily.   No facility-administered encounter medications on file as of 06/20/2020.        Goals Addressed            This Visit's Progress   . Chronic  Disease Management   On track    CARE PLAN ENTRY (see longitudinal plan of care for additional care plan information)  Current Barriers:  . Chronic Disease Management support and education needs related to CHF, HTN and DM.  Case Manager Clinical Goal(s):  Over the next 90 days, patient will: . Continue taking medications as prescribed. . Continue attending medical appointments as scheduled. . Monitor blood pressure and record readings. . Continue adherence with recommended diabetic/heart healthy diet.   Interventions:  . Inter-disciplinary care team collaboration (see longitudinal plan of care) . Reviewed medications. Continues to take medications as prescribed. Reports Lovastatin 40 mg was previously ordered by the [facility] Cardiology team. She received the last refill (90 day supply) but unsure if this medication will be discontinued. Will plan to discuss this with the team as she will likely need updated labs. No concerns regarding prescription costs.   . Discussed BP readings and compliance with recommended CHF treatment plans. Reports home BP readings have ranged in the 130's over 70's. Pulse has ranged in the 70's. She continues to weigh daily as recommended. Denies concerning symptoms r/t CHF complications. Reports weight of 143. She remains very active and denies decline in activity tolerance.    . Discussed plan for care management and follow up. Dawn Roberts reports doing very well today. She remains compliant with her provider's treatment recommendations. Will send additional educational resources to assist with  maintaining a cardiac prudent/modified carb diet. We discussed the possible need for updated labs within the next few months. Will confirm with her providers. Overall, she feels that she is progressing well and remains motivated to meet her health goals. Denies urgent concerns or changes in care management needs. We will plan to follow-up again next month.   Patient Self Care  Activities:  . Self administers medications  . Attends scheduled provider appointments . Calls pharmacy for medication refills . Performs ADL's independently . Performs IADL's independently . Calls provider office for new concerns or questions   Please see past updates related to this goal by clicking on the "Past Updates" button in the selected goal           PLAN A member of the care management team will follow-up with Dawn Roberts next month.    [facility] Care [facility] [phone]

## 2020-06-21 NOTE — Patient Instructions (Signed)
Thank you for allowing the Chronic Care Management team to participate in your care.   Goals Addressed            This Visit's Progress   . Chronic Disease Management   On track    CARE PLAN ENTRY (see longitudinal plan of care for additional care plan information)  Current Barriers:  . Chronic Disease Management support and education needs related to CHF, HTN and DM.  Case Manager Clinical Goal(s):  Over the next 90 days, patient will: . Continue taking medications as prescribed. . Continue attending medical appointments as scheduled. . Monitor blood pressure and record readings. . Continue adherence with recommended diabetic/heart healthy diet.   Interventions:  . Inter-disciplinary care team collaboration (see longitudinal plan of care) . Reviewed medications. Continues to take medications as prescribed. Reports Lovastatin 40 mg was previously ordered by the [facility] Cardiology team. She received the last refill (90 day supply) but unsure if this medication will be discontinued. Will plan to discuss this with the team as she will likely need updated labs. No concerns regarding prescription costs.   . Discussed BP readings and compliance with recommended CHF treatment plans. Reports home BP readings have ranged in the 130's over 70's. Pulse has ranged in the 70's. She continues to weigh daily recommended. Denies concerning symptoms r/t CHF complications. Reports weight of 143. She remains very active and denies decline in activity tolerance.    . Discussed plan for care management and follow up. Ms. Stokely reports doing very well today. She remains compliant with her provider's treatment recommendations. Will send additional educational resources to assist with maintaining a cardiac prudent/modified carb diet. We discussed the possible need for updated labs within the next few months. Will confirm with her providers. Overall, she feels that she is progressing well and remains motivated  to meet her health goals. Denies urgent concerns or changes in care management needs. We will plan to follow-up again next month.   Patient Self Care Activities:  . Self administers medications  . Attends scheduled provider appointments . Calls pharmacy for medication refills . Performs ADL's independently . Performs IADL's independently . Calls provider office for new concerns or questions   Please see past updates related to this goal by clicking on the "Past Updates" button in the selected goal        Ms. Winkles verbalized understanding of the information discussed during the telephonic outreach today. Declined need for a mailed/printed copy of the instructions.   A member of the care management team will follow-up with Ms. Brunetti next month.    [facility] [phone]

## 2020-07-04 ENCOUNTER — Ambulatory Visit: Payer: Self-pay

## 2020-07-04 NOTE — Chronic Care Management (AMB) (Signed)
Encounter created in error: Requested documents previously mailed.

## 2020-07-11 ENCOUNTER — Encounter: Payer: Self-pay | Admitting: Dermatology

## 2020-07-11 ENCOUNTER — Ambulatory Visit: Payer: PPO | Admitting: Dermatology

## 2020-07-11 ENCOUNTER — Other Ambulatory Visit: Payer: Self-pay

## 2020-07-11 DIAGNOSIS — L814 Other melanin hyperpigmentation: Secondary | ICD-10-CM | POA: Diagnosis not present

## 2020-07-11 DIAGNOSIS — L578 Other skin changes due to chronic exposure to nonionizing radiation: Secondary | ICD-10-CM

## 2020-07-11 DIAGNOSIS — L821 Other seborrheic keratosis: Secondary | ICD-10-CM | POA: Diagnosis not present

## 2020-07-11 DIAGNOSIS — Z1283 Encounter for screening for malignant neoplasm of skin: Secondary | ICD-10-CM | POA: Diagnosis not present

## 2020-07-11 DIAGNOSIS — Z86018 Personal history of other benign neoplasm: Secondary | ICD-10-CM

## 2020-07-11 DIAGNOSIS — L820 Inflamed seborrheic keratosis: Secondary | ICD-10-CM | POA: Diagnosis not present

## 2020-07-11 DIAGNOSIS — L570 Actinic keratosis: Secondary | ICD-10-CM | POA: Diagnosis not present

## 2020-07-11 DIAGNOSIS — R238 Other skin changes: Secondary | ICD-10-CM

## 2020-07-11 DIAGNOSIS — L720 Epidermal cyst: Secondary | ICD-10-CM

## 2020-07-11 DIAGNOSIS — D229 Melanocytic nevi, unspecified: Secondary | ICD-10-CM | POA: Diagnosis not present

## 2020-07-11 DIAGNOSIS — D692 Other nonthrombocytopenic purpura: Secondary | ICD-10-CM

## 2020-07-11 DIAGNOSIS — D2271 Melanocytic nevi of right lower limb, including hip: Secondary | ICD-10-CM

## 2020-07-11 DIAGNOSIS — D1800 Hemangioma unspecified site: Secondary | ICD-10-CM | POA: Diagnosis not present

## 2020-07-11 MED ORDER — TRETINOIN 0.1 % EX CREA: TOPICAL_CREAM | CUTANEOUS | 6 refills | Status: DC

## 2020-07-11 NOTE — Progress Notes (Signed)
Follow-Up Visit   Subjective  Dawn Roberts is a 74 y.o. female who presents for the following: TBSE (No areas of concern today).  Patient here for full body skin exam and skin cancer screening. Patient has no history of skin cancer, and has no areas of concern today. 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  She has a spot that gets irritated under her L arm.  She uses tretinoin cream for milia and needs a rf.  The following portions of the chart were reviewed this encounter and updated as appropriate:      Review of Systems:  No other skin or systemic complaints except as noted in HPI or Assessment and Plan.  Objective  Well appearing patient in no apparent distress; mood and affect are within normal limits.  A full examination was performed including scalp, head, eyes, ears, nose, lips, neck, chest, axillae, abdomen, back, buttocks, bilateral upper extremities, bilateral lower extremities, hands, feet, fingers, toes, fingernails, and toenails. All findings within normal limits unless otherwise noted below.  Objective  Chest, Head - Anterior (Face): Smooth white papule(s).   Objective  Left Axilla: Erythematous keratotic or waxy stuck-on papule   Objective  Right Thigh - Anterior: 2.5 mm med dark brown macule  Objective  Right Lower Vermilion Lip : Blue macule  Objective  Right Lower Cheek: Clear today  Objective  Right 4th Toe nail plate: 2 mm dark blue macule at proximal nail plate  Images     Assessment & Plan  Milia (2) Head - Anterior (Face); Chest  Benign, observe.  Continue tretinoin 0.1% cream qhs as tolerated   Inflamed seborrheic keratosis Left Axilla  Cryotherapy today Prior to procedure, discussed risks of blister formation, small wound, skin dyspigmentation, or rare scar following cryotherapy.    Destruction of lesion - Left Axilla  Destruction method: cryotherapy   Informed consent: discussed and  consent obtained   Lesion destroyed using liquid nitrogen: Yes   Region frozen until ice ball extended beyond lesion: Yes   Outcome: patient tolerated procedure well with no complications   Post-procedure details: wound care instructions given    Nevus Right Thigh - Anterior  Benign-appearing.  Observation.  Call clinic for new or changing lesions.  Recommend daily use of broad spectrum spf 30+ sunscreen to sun-exposed areas.    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    AK (actinic keratosis) Right Lower Cheek  Clear Previously treated with 53f/u / calcipotriene cream and LN2  Purpura (HCC) Right 4th Toe nail plate  Likely traumatic.  Benign-appearing.  Observation.  Should slowly grow out.  Call clinic if changing.     Lentigines - Scattered tan macules - Discussed due to sun exposure - Benign, observe - Call for any changes  Seborrheic Keratoses - Stuck-on, waxy, tan-brown papules and plaques  - Discussed benign etiology and prognosis. - Observe - Call for any changes  Melanocytic Nevi - Tan-brown and/or pink-flesh-colored symmetric macules and papules - Benign appearing on exam today - Observation - Call clinic for new or changing moles - Recommend daily use of broad spectrum spf 30+ sunscreen to sun-exposed areas.   Hemangiomas - Red papules - Discussed benign nature - Observe - Call for any changes  Actinic Damage - diffuse scaly erythematous macules with underlying dyspigmentation - Recommend daily broad spectrum sunscreen SPF 30+ to sun-exposed areas, reapply every 2 hours as needed.  - Call for new  or changing lesions.  Skin cancer screening performed today.  History of Dysplastic Nevi R. Upper back 07/16/19 - No evidence of recurrence today - Recommend regular full body skin exams - Recommend daily broad spectrum sunscreen SPF 30+ to sun-exposed areas, reapply every 2 hours as needed.  - Call if any new or changing  lesions are noted between office visits  Return in about 1 year (around 07/11/2021) for TBSE.  I, Donzetta Kohut, CMA, am acting as scribe for [name], MD .  Documentation: I have reviewed the above documentation for accuracy and completeness, and I agree with the above.  [name] MD

## 2020-07-11 NOTE — Patient Instructions (Addendum)
Recommend daily broad spectrum sunscreen SPF 30+ to sun-exposed areas, reapply every 2 hours as needed. Call for new or changing lesions. Prior to procedure, discussed risks of blister formation, small wound, skin dyspigmentation, or rare scar following cryotherapy.  Liquid nitrogen was applied for 10-12 seconds to the skin lesion and the expected blistering or scabbing reaction explained. Do not pick at the area. Patient reminded to expect hypopigmented scars from the procedure. Return if lesion fails to fully resolve.  Cryotherapy Aftercare  . Wash gently with soap and water everyday.   Marland Kitchen Apply Vaseline and Band-Aid daily until healed.   Seborrheic Keratosis  What causes seborrheic keratoses? Seborrheic keratoses are harmless, common skin growths that first appear during adult life.  As time goes by, more growths appear.  Some people may develop a large number of them.  Seborrheic keratoses appear on both covered and uncovered body parts.  They are not caused by sunlight.  The tendency to develop seborrheic keratoses can be inherited.  They vary in color from skin-colored to gray, brown, or even black.  They can be either smooth or have a rough, warty surface.   Seborrheic keratoses are superficial and look as if they were stuck on the skin.  Under the microscope this type of keratosis looks like layers upon layers of skin.  That is why at times the top layer may seem to fall off, but the rest of the growth remains and re-grows.    Treatment Seborrheic keratoses do not need to be treated, but can easily be removed in the office.  Seborrheic keratoses often cause symptoms when they rub on clothing or jewelry.  Lesions can be in the way of shaving.  If they become inflamed, they can cause itching, soreness, or burning.  Removal of a seborrheic keratosis can be accomplished by freezing, burning, or surgery. If any spot bleeds, scabs, or grows rapidly, please return to have it checked, as these can be  an indication of a skin cancer.

## 2020-07-20 ENCOUNTER — Telehealth: Payer: Self-pay

## 2020-08-03 ENCOUNTER — Ambulatory Visit: Payer: Self-pay

## 2020-08-03 DIAGNOSIS — I10 Essential (primary) hypertension: Secondary | ICD-10-CM

## 2020-08-03 DIAGNOSIS — I5022 Chronic systolic (congestive) heart failure: Secondary | ICD-10-CM

## 2020-08-07 NOTE — Patient Instructions (Addendum)
Thank you for allowing the Chronic Care Management team to participate in your care.   Goals Addressed            This Visit's Progress   . Chronic Disease Management       CARE PLAN ENTRY (see longitudinal plan of care for additional care plan information)  Current Barriers:  . Chronic Disease Management support and education needs related to CHF, HTN and DM.  Case Manager Clinical Goal(s):  Over the next 90 days, patient will: . Continue taking medications as prescribed. . Continue attending medical appointments as scheduled. . Monitor blood pressure and record readings. . Continue adherence with recommended diabetic/heart healthy diet.   Interventions:  . Inter-disciplinary care team collaboration (see longitudinal plan of care) . Discussed medications and compliance with treatment recommendations. Taking medications as prescribed. Reports BP readings have been within range. Continues to monitor weights as recommended. Current weight of 145 lbs. Denies symptoms r/t CHF complications.   . Discussed plan for care management and follow up. Reports  coughing and nasal drainage r/t a sinus congestion. She is using Mucinex. Using Tylenol for occasional headaches as needed. Denies episodes of shortness of breath. No fevers. Discussed s/sx of worsening symptoms that require medical follow-up. Agreed to update her provider if the symptoms linger or worsen. Overall reports doing well. Denies urgent concerns today. Agreed to outreach again in three months.   Patient Self Care Activities:  . Self administers medications  . Attends scheduled provider appointments . Calls pharmacy for medication refills . Performs ADL's independently . Performs IADL's independently . Calls provider office for new concerns or questions   Please see past updates related to this goal by clicking on the "Past Updates" button in the selected goal         Ms. Drennen verbalized understanding of the  information discussed during the telephonic outreach today. Declined need for a mailed/printed copy of the information.   A member of the care management team will follow-up with Ms. Agrawal within the next three months.    [facility] Care [facility] [phone]

## 2020-08-07 NOTE — Chronic Care Management (AMB) (Signed)
Chronic Care Management   Follow Up Note    Name: Dawn Roberts MRN: [id number] DOB: 03-16-1946  Primary Care Provider: Mar Daring, PA-C Reason for referral : Chronic Care Management   Dawn Roberts is a 74 y.o. year old female who is a primary care patient of [name]. She is currently engaged with the chronic care management team. A routine telephonic outreach was conducted today.  Review of Ms. Drum status, including review of consultants reports, relevant labs and test results was conducted today. Collaboration with appropriate care team members was performed as part of the comprehensive evaluation and provision of chronic care management services.    SDOH (Social Determinants of Health) assessments performed: No    Outpatient Encounter Medications as of 08/03/2020  Medication Sig  . aspirin EC 81 MG tablet Take 81 mg by mouth daily with supper.  Marland Kitchen Bioflavonoid Products (VITAMIN C) CHEW Chew 1 tablet by mouth 3 (three) times a week.  . Calcium Carb-Cholecalciferol (CALCIUM + D3 PO) Take 1 tablet by mouth 2 (two) times daily.  . carvedilol (COREG) 6.25 MG tablet Take 6.25 mg by mouth 2 (two) times daily.  Marland Kitchen losartan (COZAAR) 50 MG tablet Take 50 mg by mouth daily.   Marland Kitchen lovastatin (MEVACOR) 40 MG tablet Take 40 mg by mouth at bedtime.   . Misc Natural Products (OSTEO BI-FLEX ADV JOINT SHIELD PO) Take 1 tablet by mouth 2 (two) times daily.   . Multiple Vitamin (MULTIVITAMIN WITH MINERALS) TABS tablet Take 1 tablet by mouth daily with lunch.  . spironolactone (ALDACTONE) 25 MG tablet Take 25 mg by mouth daily.  Marland Kitchen tretinoin (RETIN-A) 0.1 % cream APPLY 1 APPLICATION TO AFFECTED AREAS ON FACE AND CHEST AT BEDTIME EVERY OTHER NIGHT.  Marland Kitchen vitamin B-12 (CYANOCOBALAMIN) 1000 MCG tablet Take 1,000 mcg by mouth daily.   No facility-administered encounter medications on file as of 08/03/2020.      Goals Addressed            This Visit's Progress   .  Chronic Disease Management       CARE PLAN ENTRY (see longitudinal plan of care for additional care plan information)  Current Barriers:  . Chronic Disease Management support and education needs related to CHF, HTN and DM.  Case Manager Clinical Goal(s):  Over the next 90 days, patient will: . Continue taking medications as prescribed. . Continue attending medical appointments as scheduled. . Monitor blood pressure and record readings. . Continue adherence with recommended diabetic/heart healthy diet.   Interventions:  . Inter-disciplinary care team collaboration (see longitudinal plan of care) . Discussed medications and compliance with treatment recommendations. Taking medications as prescribed. Reports BP readings have been within range. Continues to monitor weights as recommended. Current weight of 145 lbs. Denies symptoms r/t CHF complications.   . Discussed plan for care management and follow up. Reports  coughing and nasal drainage r/t a sinus congestion. She is using Mucinex. Using Tylenol for occasional headaches as needed. Denies episodes of shortness of breath. No fevers. Discussed s/sx of worsening symptoms that require medical follow-up. Agreed to update her provider if the symptoms linger or worsen. Overall reports doing well. Denies urgent concerns today. Agreed to outreach again in three months.   Patient Self Care Activities:  . Self administers medications  . Attends scheduled provider appointments . Calls pharmacy for medication refills . Performs ADL's independently . Performs IADL's independently . Calls provider office for new concerns or questions  Please see past updates related to this goal by clicking on the "Past Updates" button in the selected goal          PLAN A member of the care management team will follow-up with Ms. Oltmann within the next three months.    Cristy Friedlander [facility] Care Management [facility] [phone]

## 2020-11-07 ENCOUNTER — Ambulatory Visit: Payer: Self-pay

## 2020-11-07 DIAGNOSIS — I5022 Chronic systolic (congestive) heart failure: Secondary | ICD-10-CM

## 2020-11-07 DIAGNOSIS — I10 Essential (primary) hypertension: Secondary | ICD-10-CM

## 2020-11-08 NOTE — Chronic Care Management (AMB) (Signed)
Chronic Care Management   Follow Up Note    Name: Dawn Roberts MRN: [id number] DOB: 03/22/1946  Primary Care Provider: Mar Daring, PA-C Reason for referral : Chronic Care Management  Dawn Roberts is a 75 y.o. year old female who is a primary care patient of Dawn Roberts. A telephonic outreach was conducted today. She has met her care management goals.  Review of Dawn Roberts status, including review of consultants reports, relevant labs and test results was conducted today. Collaboration with appropriate care team members was performed as part of the comprehensive evaluation and provision of chronic care management services.    SDOH (Social Determinants of Health) assessments performed: No    Outpatient Encounter Medications as of 11/07/2020  Medication Sig  . aspirin EC 81 MG tablet Take 81 mg by mouth daily with supper.  Marland Kitchen Bioflavonoid Products (VITAMIN C) CHEW Chew 1 tablet by mouth 3 (three) times a week.  . Calcium Carb-Cholecalciferol (CALCIUM + D3 PO) Take 1 tablet by mouth 2 (two) times daily.  . carvedilol (COREG) 6.25 MG tablet Take 6.25 mg by mouth 2 (two) times daily.  Marland Kitchen losartan (COZAAR) 50 MG tablet Take 50 mg by mouth daily.   Marland Kitchen lovastatin (MEVACOR) 40 MG tablet Take 40 mg by mouth at bedtime.   . Misc Natural Products (OSTEO BI-FLEX ADV JOINT SHIELD PO) Take 1 tablet by mouth 2 (two) times daily.   . Multiple Vitamin (MULTIVITAMIN WITH MINERALS) TABS tablet Take 1 tablet by mouth daily with lunch.  . spironolactone (ALDACTONE) 25 MG tablet Take 25 mg by mouth daily.  Marland Kitchen tretinoin (RETIN-A) 0.1 % cream APPLY 1 APPLICATION TO AFFECTED AREAS ON FACE AND CHEST AT BEDTIME EVERY OTHER NIGHT.  Marland Kitchen vitamin B-12 (CYANOCOBALAMIN) 1000 MCG tablet Take 1,000 mcg by mouth daily.   No facility-administered encounter medications on file as of 11/07/2020.     Objective:  Goals Addressed            This Visit's Progress   . COMPLETED: Chronic Disease  Management       CARE PLAN ENTRY (see longitudinal plan of care for additional care plan information)  Current Barriers:  . Chronic Disease Management support and education needs related to CHF, HTN and DM.  Case Manager Clinical Goal(s):  Over the next 90 days, patient will: . Continue taking medications as prescribed. . Continue attending medical appointments as scheduled. . Monitor blood pressure and record readings. . Continue adherence with recommended diabetic/heart healthy diet.   Interventions:  . Inter-disciplinary care team collaboration (see longitudinal plan of care) . Discussed plan for care management. Dawn Roberts has met her care management goals. She remains compliant with medications and treatment recommendations. Reports blood pressure has been within the established range with readings in the 120's over 70's. Reports pulse has ranged in the 60's and 70's. Reports weight has remained stable. Denies experiencing symptoms r/t CHF complications. Reports doing very well. She will complete outreach with the Cardiology team on 11/21/20. She will return to the clinic for a PCP follow-up on 02/05/21. Denies urgent concerns or changes in care management needs. Agreed to contact her provider if her condition changes and additional outreach is required.   Patient Self Care Activities:  . Self administers medications  . Attends scheduled provider appointments . Calls pharmacy for medication refills . Performs ADL's independently . Performs IADL's independently . Calls provider office for new concerns or questions   Please see past updates related  to this goal by clicking on the "Past Updates" button in the selected goal         PLAN Dawn Roberts will contact her provider if her condition changes and additional outreach is required.  The care management team will gladly assist.    Cristy [facility]lander [facility] Care Management [facility] [phone]

## 2020-11-08 NOTE — Patient Instructions (Signed)
  Thank you for allowing the Chronic Care Management team to participate in your care.   Goals Addressed            This Visit's Progress   . COMPLETED: Chronic Disease Management       CARE PLAN ENTRY (see longitudinal plan of care for additional care plan information)  Current Barriers:  . Chronic Disease Management support and education needs related to CHF, HTN and DM.  Case Manager Clinical Goal(s):  Over the next 90 days, patient will: . Continue taking medications as prescribed. . Continue attending medical appointments as scheduled. . Monitor blood pressure and record readings. . Continue adherence with recommended diabetic/heart healthy diet.   Interventions:  . Inter-disciplinary care team collaboration (see longitudinal plan of care) . Discussed plan for care management. Dawn Roberts has met her care management goals. She remains compliant with medications and treatment recommendations. Reports blood pressure has been within the established range with readings in the 120's over 70's. Reports pulse has ranged in the 60's and 70's. Reports weight has remained stable. Denies experiencing symptoms r/t CHF complications. Reports doing very well. She will complete outreach with the Cardiology team on 11/21/20. She will return to the clinic for a PCP follow-up on 02/05/21. Denies urgent concerns or changes in care management needs. Agreed to contact her provider if her condition changes and additional outreach is required.   Patient Self Care Activities:  . Self administers medications  . Attends scheduled provider appointments . Calls pharmacy for medication refills . Performs ADL's independently . Performs IADL's independently . Calls provider office for new concerns or questions   Please see past updates related to this goal by clicking on the "Past Updates" button in the selected goal          Dawn Roberts verbalized understanding of the information discussed during the  telephonic outreach today. Declined need for mailed or printed information. She will contact her provider if her condition changes and additional outreach is required. The care management team will gladly assist.    Dawn Roberts [facility] Care Management [facility] [phone]

## 2020-11-21 ENCOUNTER — Telehealth: Payer: Self-pay

## 2020-11-21 DIAGNOSIS — E782 Mixed hyperlipidemia: Secondary | ICD-10-CM | POA: Diagnosis not present

## 2020-11-21 DIAGNOSIS — I10 Essential (primary) hypertension: Secondary | ICD-10-CM | POA: Diagnosis not present

## 2020-11-21 DIAGNOSIS — I420 Dilated cardiomyopathy: Secondary | ICD-10-CM | POA: Diagnosis not present

## 2020-11-21 DIAGNOSIS — G4730 Sleep apnea, unspecified: Secondary | ICD-10-CM | POA: Diagnosis not present

## 2020-11-21 DIAGNOSIS — I5022 Chronic systolic (congestive) heart failure: Secondary | ICD-10-CM | POA: Diagnosis not present

## 2020-11-21 NOTE — Telephone Encounter (Signed)
Copied from [place] [phone]. Topic: Appointment Scheduling - Scheduling Inquiry for Clinic >> Nov 20, 2020  4:02 PM Erick Blinks wrote: Reason for CRM: Pt called and needs to be rescheduled   Best contact: [phone]

## 2021-01-10 DIAGNOSIS — I5022 Chronic systolic (congestive) heart failure: Secondary | ICD-10-CM | POA: Diagnosis not present

## 2021-01-24 DIAGNOSIS — I10 Essential (primary) hypertension: Secondary | ICD-10-CM | POA: Diagnosis not present

## 2021-01-24 DIAGNOSIS — I5022 Chronic systolic (congestive) heart failure: Secondary | ICD-10-CM | POA: Diagnosis not present

## 2021-01-24 DIAGNOSIS — E782 Mixed hyperlipidemia: Secondary | ICD-10-CM | POA: Diagnosis not present

## 2021-02-05 ENCOUNTER — Encounter: Payer: PPO | Admitting: Physician Assistant

## 2021-04-30 ENCOUNTER — Ambulatory Visit (INDEPENDENT_AMBULATORY_CARE_PROVIDER_SITE_OTHER): Payer: PPO | Admitting: Family Medicine

## 2021-04-30 ENCOUNTER — Encounter: Payer: Self-pay | Admitting: Family Medicine

## 2021-04-30 ENCOUNTER — Other Ambulatory Visit: Payer: Self-pay

## 2021-04-30 VITALS — BP 121/65 | HR 60 | Temp 98.2°F | Resp 16 | Ht 66.0 in | Wt 145.0 lb

## 2021-04-30 DIAGNOSIS — E1169 Type 2 diabetes mellitus with other specified complication: Secondary | ICD-10-CM

## 2021-04-30 DIAGNOSIS — Z1231 Encounter for screening mammogram for malignant neoplasm of breast: Secondary | ICD-10-CM | POA: Diagnosis not present

## 2021-04-30 DIAGNOSIS — E1159 Type 2 diabetes mellitus with other circulatory complications: Secondary | ICD-10-CM

## 2021-04-30 DIAGNOSIS — E785 Hyperlipidemia, unspecified: Secondary | ICD-10-CM

## 2021-04-30 DIAGNOSIS — I5022 Chronic systolic (congestive) heart failure: Secondary | ICD-10-CM | POA: Diagnosis not present

## 2021-04-30 DIAGNOSIS — I152 Hypertension secondary to endocrine disorders: Secondary | ICD-10-CM

## 2021-04-30 DIAGNOSIS — Z0000 Encounter for general adult medical examination without abnormal findings: Secondary | ICD-10-CM | POA: Diagnosis not present

## 2021-04-30 DIAGNOSIS — I429 Cardiomyopathy, unspecified: Secondary | ICD-10-CM | POA: Diagnosis not present

## 2021-04-30 NOTE — Assessment & Plan Note (Signed)
Previously well controlled Continue statin Repeat FLP and CMP Goal LDL < 70 

## 2021-04-30 NOTE — Assessment & Plan Note (Signed)
Well controlled Continue current medications Recheck metabolic panel F/u in 6 months  

## 2021-04-30 NOTE — Assessment & Plan Note (Signed)
Well controlled on last A1c - recheck today Assoc with and complicated by HTN and HLD Continue current medications UTD on vaccines,  wil lsend for last eye exam, foot exam today On Statin, ARB Discussed diet and exercise F/u in 6 months

## 2021-04-30 NOTE — Assessment & Plan Note (Signed)
Followed by Cardiology 

## 2021-04-30 NOTE — Assessment & Plan Note (Signed)
F/b Cardiology 

## 2021-04-30 NOTE — Progress Notes (Signed)
Annual Wellness Visit     Patient: Dawn Roberts, Female    DOB: 1946/06/03, 75 y.o.   MRN: [id number] Visit Date: 04/30/2021  Today's Provider: Lavon Paganini, MD   Chief Complaint  Patient presents with   Medicare Wellness   Subjective    Dawn Roberts is a 75 y.o. female who presents today for her Annual Wellness Visit. She reports consuming a  Healthy  diet. Home exercise routine includes walking. She generally feels well. She reports sleeping well. She does not have additional problems to discuss today.   HPI  Vaccines  She is current on the pneumonia vaccine. She has not received her COVID or tetanus vaccine.   Screenings  Colonoscopy- 02/01/2015  Mammogram- 03/02/2020 She is amenable to scheduling her mammogram for this year.   Eye exam  She is due for her Eye exam at the end of August. She follows-up with [facility].   Scoliosis  She has back pain and she seen a chiropractor. He ordered an x-ray and she was told se has scoliosis and she was curious if there was anything to provide relief. Her symptoms occur most when she is doing yard work.    Family Status  Relation Name Status   Father  Deceased at age 51   Sister  Deceased at age 79       ovarian cancer   Mother  Deceased at age 81   Sister  60   Brother  Alive   Sister  Ecologist  (Not Specified)       Medications: Outpatient Medications Prior to Visit  Medication Sig   aspirin EC 81 MG tablet Take 81 mg by mouth daily with supper.   Bioflavonoid Products (VITAMIN C) CHEW Chew 1 tablet by mouth 3 (three) times a week.   Calcium Carb-Cholecalciferol (CALCIUM + D3 PO) Take 1 tablet by mouth 2 (two) times daily.   carvedilol (COREG) 6.25 MG tablet Take 6.25 mg by mouth 2 (two) times daily.   losartan (COZAAR) 50 MG tablet Take 50 mg by mouth daily.    lovastatin (MEVACOR) 40 MG tablet Take 40 mg by mouth at bedtime.    Misc Natural Products (OSTEO BI-FLEX ADV JOINT  SHIELD PO) Take 1 tablet by mouth 2 (two) times daily.    Multiple Vitamin (MULTIVITAMIN WITH MINERALS) TABS tablet Take 1 tablet by mouth daily with lunch.   spironolactone (ALDACTONE) 25 MG tablet Take 25 mg by mouth daily.   tretinoin (RETIN-A) 0.1 % cream APPLY 1 APPLICATION TO AFFECTED AREAS ON FACE AND CHEST AT BEDTIME EVERY OTHER NIGHT.   vitamin B-12 (CYANOCOBALAMIN) 1000 MCG tablet Take 1,000 mcg by mouth daily.   No facility-administered medications prior to visit.    No Known Allergies  Patient Care Team: [name], FNP as PCP - General (Family Medicine) Corey Skains, MD as Consulting Physician (Cardiology) [name], MD as Consulting Physician (Ophthalmology) Birder Robson, MD as Referring Physician (Ophthalmology)  Review of Systems  Constitutional:  Negative for chills, diaphoresis, fatigue and fever.  HENT:  Positive for voice change. Negative for ear pain, sinus pressure, sinus pain and sore throat.   Eyes:  Positive for redness. Negative for pain and visual disturbance.  Respiratory:  Negative for cough, chest tightness, shortness of breath and wheezing.   Cardiovascular:  Negative for chest pain, palpitations and leg swelling.  Gastrointestinal:  Negative for abdominal pain, blood in stool, constipation,  diarrhea, nausea and vomiting.  Genitourinary:  Negative for dysuria, flank pain, frequency, pelvic pain and urgency.  Musculoskeletal:  Positive for back pain. Negative for myalgias and neck pain.  Allergic/Immunologic: Positive for environmental allergies.  Neurological:  Negative for dizziness, seizures, syncope, weakness, light-headedness, numbness and headaches.  All other systems reviewed and are negative.      Objective    Vitals: BP 121/65 (BP Location: Right Arm, Patient Position: Sitting, Cuff Size: Normal)   Pulse 60   Temp 98.2 F (36.8 C) (Temporal)   Resp 16   Ht '5\' 6"'$  (1.676 m)   Wt 145 lb (65.8 kg)   SpO2 98%   BMI  23.40 kg/m    Physical Exam Vitals reviewed.  Constitutional:      General: She is not in acute distress.    Appearance: Normal appearance. She is well-developed. She is not diaphoretic.  HENT:     Head: Normocephalic and atraumatic.     Right Ear: Tympanic membrane, ear canal and external ear normal.     Left Ear: Tympanic membrane, ear canal and external ear normal.     Nose: Nose normal.     Mouth/Throat:     Mouth: Mucous membranes are moist.     Pharynx: Oropharynx is clear. No oropharyngeal exudate.  Eyes:     General: No scleral icterus.    Conjunctiva/sclera: Conjunctivae normal.     Pupils: Pupils are equal, round, and reactive to light.  Neck:     Thyroid: No thyromegaly.  Cardiovascular:     Rate and Rhythm: Normal rate and regular rhythm.     Pulses: Normal pulses.     Heart sounds: Normal heart sounds. No murmur heard. Pulmonary:     Effort: Pulmonary effort is normal. No respiratory distress.     Breath sounds: Normal breath sounds. No wheezing or rales.  Abdominal:     General: There is no distension.     Palpations: Abdomen is soft.     Tenderness: There is no abdominal tenderness.  Musculoskeletal:        General: No deformity.     Cervical back: Neck supple.     Right lower leg: No edema.     Left lower leg: No edema.  Feet:     Comments: Diabetic Foot Exam - Simple   Simple Foot Form Diabetic Foot exam was performed with the following findings: Yes 04/30/2021   1:47 PM  Visual Inspection See comments: Yes Sensation Testing Intact to touch and monofilament testing bilaterally: Yes Pulse Check Posterior Tibialis and Dorsalis pulse intact bilaterally: Yes Comments Hammer toes L>R    Lymphadenopathy:     Cervical: No cervical adenopathy.  Skin:    General: Skin is warm and dry.     Findings: No rash.  Neurological:     Mental Status: She is alert and oriented to person, place, and time. Mental status is at baseline.     Sensory: No sensory  deficit.     Motor: No weakness.     Gait: Gait normal.  Psychiatric:        Mood and Affect: Mood normal.        Behavior: Behavior normal.        Thought Content: Thought content normal.    Most recent functional status assessment: In your present state of health, do you have any difficulty performing the following activities: 04/30/2021  Hearing? N  Vision? N  Difficulty concentrating or making decisions? N  Walking  or climbing stairs? N  Dressing or bathing? N  Doing errands, shopping? N  Some recent data might be hidden   Most recent fall risk assessment: Fall Risk  04/30/2021  Falls in the past year? 0  Number falls in past yr: 0  Injury with Fall? 0  Risk for fall due to : No Fall Risks  Follow up Falls evaluation completed    Most recent depression screenings: PHQ 2/9 Scores 04/30/2021 03/23/2020  PHQ - 2 Score 0 0  PHQ- 9 Score 0 -   Most recent cognitive screening: 6CIT Screen 02/03/2020  What Year? 0 points  What month? 0 points  What time? 0 points  Count back from 20 0 points  Months in reverse 0 points  Repeat phrase 0 points  Total Score 0   Most recent Audit-C alcohol use screening Alcohol Use Disorder Test (AUDIT) 04/30/2021  1. How often do you have a drink containing alcohol? 0  2. How many drinks containing alcohol do you have on a typical day when you are drinking? 0  3. How often do you have six or more drinks on one occasion? 0  AUDIT-C Score 0  Alcohol Brief Interventions/Follow-up -   A score of 3 or more in women, and 4 or more in men indicates increased risk for alcohol abuse, EXCEPT if all of the points are from question 1   No results found for any visits on 04/30/21.  Assessment & Plan     Problem List Items Addressed This Visit       Cardiovascular and Mediastinum   Cardiomyopathy ([place])    F/b Cardiology       Chronic systolic heart failure ([place])    Followed by Cardiology       Hypertension associated with diabetes ([place])     Well controlled Continue current medications Recheck metabolic panel F/u in 6 months          Endocrine   Hyperlipidemia associated with type 2 diabetes mellitus ([place])    Previously well controlled Continue statin Repeat FLP and CMP Goal LDL < 70       Relevant Orders   Comprehensive metabolic panel   Lipid panel   T2DM (type 2 diabetes mellitus) ([place])    Well controlled on last A1c - recheck today Assoc with and complicated by HTN and HLD Continue current medications UTD on vaccines,  wil lsend for last eye exam, foot exam today On Statin, ARB Discussed diet and exercise F/u in 6 months        Relevant Orders   Hemoglobin A1c   Other Visit Diagnoses     Encounter for annual wellness visit (AWV) in Medicare patient    -  Primary   Encounter for annual physical exam       Relevant Orders   Comprehensive metabolic panel   Lipid panel   Hemoglobin A1c   MM 3D SCREEN BREAST BILATERAL   Encounter for screening mammogram for malignant neoplasm of breast       Relevant Orders   MM 3D SCREEN BREAST BILATERAL       Annual wellness visit done today including the all of the following: Reviewed patient's Family Medical History Reviewed and updated list of patient's medical providers Assessment of cognitive impairment was done Assessed patient's functional ability Established a written schedule for health screening [place] Completed and Reviewed  Exercise Activities and Dietary recommendations  Goals      DIET - INCREASE  WATER INTAKE     Recommend increasing water intake to 4-6 glasses a day.       HEMOGLOBIN A1C < 7.0        Immunization History  Administered Date(s) Administered   Influenza, High Dose Seasonal PF 07/14/2014, 06/12/2015, 06/28/2017   Influenza-Unspecified 07/06/2018   Pneumococcal Conjugate-13 12/07/2014   Pneumococcal Polysaccharide-23 09/26/2011   Tdap 02/10/2006   Zoster Recombinat (Shingrix) 09/11/2018,  12/08/2018    Health Maintenance  Topic Date Due   TETANUS/TDAP  02/11/2016   OPHTHALMOLOGY EXAM  12/04/2018   HEMOGLOBIN A1C  08/06/2020   INFLUENZA VACCINE  04/23/2021   COVID-19 Vaccine (1) 05/16/2022 (Originally 07/25/1951)   FOOT EXAM  04/30/2022   COLONOSCOPY (Pts 45-62yr Insurance coverage will need to be confirmed)  01/31/2025   DEXA SCAN  Completed   Hepatitis C Screening  Completed   PNA vac Low Risk Adult  Completed   Zoster Vaccines- Shingrix  Completed   HPV VACCINES  Aged Out     Discussed health benefits of physical activity, and encouraged her to engage in regular exercise appropriate for her age and condition.     Return in about 6 months (around 10/31/2021) for chronic disease f/u, With new PCP.     I,Essence Turner,acting as a sEducation administratorfor ALavon Paganini MD.,have documented all relevant documentation on the behalf of ALavon Paganini MD,as directed by  ALavon Paganini MD while in the presence of ALavon Paganini MD.  I, ALavon Paganini MD, have reviewed all documentation for this visit. The documentation on 04/30/21 for the exam, diagnosis, procedures, and orders are all accurate and complete.   Itzelle Gains, ADionne Bucy MD, MPH [facility]

## 2021-05-01 LAB — COMPREHENSIVE METABOLIC PANEL
ALT: 17 IU/L (ref 0–32)
AST: 19 IU/L (ref 0–40)
Albumin/Globulin Ratio: 1.9 (ref 1.2–2.2)
Albumin: 4.2 g/dL (ref 3.7–4.7)
Alkaline Phosphatase: 39 IU/L — ABNORMAL LOW (ref 44–121)
BUN/Creatinine Ratio: 21 (ref 12–28)
BUN: 22 mg/dL (ref 8–27)
Bilirubin Total: 0.3 mg/dL (ref 0.0–1.2)
CO2: 21 mmol/L (ref 20–29)
Calcium: 10.6 mg/dL — ABNORMAL HIGH (ref 8.7–10.3)
Chloride: 107 mmol/L — ABNORMAL HIGH (ref 96–106)
Creatinine, Ser: 1.04 mg/dL — ABNORMAL HIGH (ref 0.57–1.00)
Globulin, Total: 2.2 g/dL (ref 1.5–4.5)
Glucose: 107 mg/dL — ABNORMAL HIGH (ref 65–99)
Potassium: 4.4 mmol/L (ref 3.5–5.2)
Sodium: 144 mmol/L (ref 134–144)
Total Protein: 6.4 g/dL (ref 6.0–8.5)
eGFR: 56 mL/min/{1.73_m2} — ABNORMAL LOW (ref >59)

## 2021-05-01 LAB — LIPID PANEL
Chol/HDL Ratio: 3.6 ratio (ref 0.0–4.4)
Cholesterol, Total: 165 mg/dL (ref 100–199)
HDL: 46 mg/dL (ref >39)
LDL Chol Calc (NIH): 104 mg/dL — ABNORMAL HIGH (ref 0–99)
Triglycerides: 81 mg/dL (ref 0–149)
VLDL Cholesterol Cal: 15 mg/dL (ref 5–40)

## 2021-05-01 LAB — HEMOGLOBIN A1C
Est. average glucose Bld gHb Est-mCnc: 137 mg/dL
Hgb A1c MFr Bld: 6.4 % — ABNORMAL HIGH (ref 4.8–5.6)

## 2021-05-21 ENCOUNTER — Ambulatory Visit
Admission: RE | Admit: 2021-05-21 | Discharge: 2021-05-21 | Disposition: A | Discharge status: Home / Self Care | Payer: PPO | Source: Ambulatory Visit | Attending: Family Medicine | Admitting: Family Medicine

## 2021-05-21 ENCOUNTER — Other Ambulatory Visit: Payer: Self-pay

## 2021-05-21 DIAGNOSIS — Z1231 Encounter for screening mammogram for malignant neoplasm of breast: Secondary | ICD-10-CM | POA: Diagnosis not present

## 2021-05-21 DIAGNOSIS — H43813 Vitreous degeneration, bilateral: Secondary | ICD-10-CM | POA: Diagnosis not present

## 2021-05-21 DIAGNOSIS — Z0000 Encounter for general adult medical examination without abnormal findings: Secondary | ICD-10-CM

## 2021-07-06 DIAGNOSIS — J209 Acute bronchitis, unspecified: Secondary | ICD-10-CM | POA: Diagnosis not present

## 2021-07-06 DIAGNOSIS — J069 Acute upper respiratory infection, unspecified: Secondary | ICD-10-CM | POA: Diagnosis not present

## 2021-07-24 ENCOUNTER — Other Ambulatory Visit: Payer: Self-pay

## 2021-07-24 ENCOUNTER — Ambulatory Visit: Payer: PPO | Admitting: Dermatology

## 2021-07-24 DIAGNOSIS — Z1283 Encounter for screening for malignant neoplasm of skin: Secondary | ICD-10-CM | POA: Diagnosis not present

## 2021-07-24 DIAGNOSIS — L918 Other hypertrophic disorders of the skin: Secondary | ICD-10-CM

## 2021-07-24 DIAGNOSIS — L720 Epidermal cyst: Secondary | ICD-10-CM

## 2021-07-24 DIAGNOSIS — I8393 Asymptomatic varicose veins of bilateral lower extremities: Secondary | ICD-10-CM

## 2021-07-24 DIAGNOSIS — D1800 Hemangioma unspecified site: Secondary | ICD-10-CM | POA: Diagnosis not present

## 2021-07-24 DIAGNOSIS — Z86018 Personal history of other benign neoplasm: Secondary | ICD-10-CM | POA: Diagnosis not present

## 2021-07-24 DIAGNOSIS — L820 Inflamed seborrheic keratosis: Secondary | ICD-10-CM

## 2021-07-24 DIAGNOSIS — Z872 Personal history of diseases of the skin and subcutaneous tissue: Secondary | ICD-10-CM

## 2021-07-24 DIAGNOSIS — R238 Other skin changes: Secondary | ICD-10-CM

## 2021-07-24 DIAGNOSIS — D229 Melanocytic nevi, unspecified: Secondary | ICD-10-CM | POA: Diagnosis not present

## 2021-07-24 DIAGNOSIS — D2271 Melanocytic nevi of right lower limb, including hip: Secondary | ICD-10-CM | POA: Diagnosis not present

## 2021-07-24 DIAGNOSIS — L814 Other melanin hyperpigmentation: Secondary | ICD-10-CM | POA: Diagnosis not present

## 2021-07-24 DIAGNOSIS — L821 Other seborrheic keratosis: Secondary | ICD-10-CM

## 2021-07-24 DIAGNOSIS — L570 Actinic keratosis: Secondary | ICD-10-CM

## 2021-07-24 DIAGNOSIS — L578 Other skin changes due to chronic exposure to nonionizing radiation: Secondary | ICD-10-CM

## 2021-07-24 MED ORDER — TRETINOIN 0.1 % EX CREA: TOPICAL_CREAM | CUTANEOUS | 6 refills | Status: DC

## 2021-07-24 NOTE — Patient Instructions (Addendum)
Cryotherapy Aftercare  Wash gently with soap and water everyday.   Apply Vaseline and Band-Aid daily until healed.   Seborrheic Keratosis  What causes seborrheic keratoses? Seborrheic keratoses are harmless, common skin growths that first appear during adult life.  As time goes by, more growths appear.  Some people may develop a large number of them.  Seborrheic keratoses appear on both covered and uncovered body parts.  They are not caused by sunlight.  The tendency to develop seborrheic keratoses can be inherited.  They vary in color from skin-colored to gray, brown, or even black.  They can be either smooth or have a rough, warty surface.   Seborrheic keratoses are superficial and look as if they were stuck on the skin.  Under the microscope this type of keratosis looks like layers upon layers of skin.  That is why at times the top layer may seem to fall off, but the rest of the growth remains and re-grows.    Treatment Seborrheic keratoses do not need to be treated, but can easily be removed in the office.  Seborrheic keratoses often cause symptoms when they rub on clothing or jewelry.  Lesions can be in the way of shaving.  If they become inflamed, they can cause itching, soreness, or burning.  Removal of a seborrheic keratosis can be accomplished by freezing, burning, or surgery. If any spot bleeds, scabs, or grows rapidly, please return to have it checked, as these can be an indication of a skin cancer.  Melanoma ABCDEs  Melanoma is the most dangerous type of skin cancer, and is the leading cause of death from skin disease.  You are more likely to develop melanoma if you: Have light-colored skin, light-colored eyes, or red or blond hair Spend a lot of time in the sun Tan regularly, either outdoors or in a tanning bed Have had blistering sunburns, especially during childhood Have a close family member who has had a melanoma Have atypical moles or large birthmarks  Early detection of  melanoma is key since treatment is typically straightforward and cure rates are extremely high if we catch it early.   The first sign of melanoma is often a change in a mole or a new dark spot.  The ABCDE system is a way of remembering the signs of melanoma.  A for asymmetry:  The two halves do not match. B for border:  The edges of the growth are irregular. C for color:  A mixture of colors are present instead of an even brown color. D for diameter:  Melanomas are usually (but not always) greater than 6mm - the size of a pencil eraser. E for evolution:  The spot keeps changing in size, shape, and color.  Please check your skin once per month between visits. You can use a small mirror in front and a large mirror behind you to keep an eye on the back side or your body.   If you see any new or changing lesions before your next follow-up, please call to schedule a visit.  Please continue daily skin protection including broad spectrum sunscreen SPF 30+ to sun-exposed areas, reapplying every 2 hours as needed when you're outdoors.   Staying in the shade or wearing long sleeves, sun glasses (UVA+UVB protection) and wide brim hats (4-inch brim around the entire circumference of the hat) are also recommended for sun protection.    If you have any questions or concerns for your doctor, please call our main line at [phone]   and press option 4 to reach your doctor's medical assistant. If no one answers, please leave a voicemail as directed and we will return your call as soon as possible. Messages left after 4 pm will be answered the following business day.   You may also send us a message via MyChart. We typically respond to MyChart messages within 1-2 business days.  For prescription refills, please ask your pharmacy to contact our office. Our fax number is [phone].  If you have an urgent issue when the clinic is closed that cannot wait until the next business day, you can page your doctor at  the number below.    Please note that while we do our best to be available for urgent issues outside of office hours, we are not available 24/7.   If you have an urgent issue and are unable to reach us, you may choose to seek medical care at your doctor's office, retail clinic, urgent care center, or emergency room.  If you have a medical emergency, please immediately call 911 or go to the emergency department.  Pager Numbers  - Dr. Kowalski: [phone]  - Dr. Moye: [phone]  - Dr. Stewart: [phone]  In the event of inclement weather, please call our main line at [phone] for an update on the status of any delays or closures.  Dermatology Medication Tips: Please keep the boxes that topical medications come in in order to help keep track of the instructions about where and how to use these. Pharmacies typically print the medication instructions only on the boxes and not directly on the medication tubes.   If your medication is too expensive, please contact our office at [phone] option 4 or send us a message through MyChart.   We are unable to tell what your co-pay for medications will be in advance as this is different depending on your insurance coverage. However, we may be able to find a substitute medication at lower cost or fill out paperwork to get insurance to cover a needed medication.   If a prior authorization is required to get your medication covered by your insurance company, please allow us 1-2 business days to complete this process.  Drug prices often vary depending on where the prescription is filled and some pharmacies may offer cheaper prices.  The website www.goodrx.com contains coupons for medications through different pharmacies. The prices here do not account for what the cost may be with help from insurance (it may be cheaper with your insurance), but the website can give you the price if you did not use any insurance.  - You can print the  associated coupon and take it with your prescription to the pharmacy.  - You may also stop by our office during regular business hours and pick up a GoodRx coupon card.  - If you need your prescription sent electronically to a different pharmacy, notify our office through [facility] MyChart or by phone at [phone] option 4.  

## 2021-07-24 NOTE — Progress Notes (Signed)
Follow-Up Visit   Subjective  Dawn Roberts is a 75 y.o. female who presents for the following: Annual Exam.  Patient here for TBSE. She has a mole on her back that she would like removed. She hits with bath sponge in the shower and it gets sore. 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    The following portions of the chart were reviewed this encounter and updated as appropriate:       Review of Systems:  No other skin or systemic complaints except as noted in HPI or Assessment and Plan.  Objective  Well appearing patient in no apparent distress; mood and affect are within normal limits.  A full examination was performed including scalp, head, eyes, ears, nose, lips, neck, chest, axillae, abdomen, back, buttocks, bilateral upper extremities, bilateral lower extremities, hands, feet, fingers, toes, fingernails, and toenails. All findings within normal limits unless otherwise noted below.  Right Anterior Thigh 2.5 mm med dark brown macule  Right Lower Vermilion Lip Soft blue papule.  face, chest Firm white papules.  Left Upper Back Erythematous keratotic or waxy stuck-on papule   L nasal tip Pink scaly macule on the left nasal tip.    Assessment & Plan   Skin cancer screening performed today.  Actinic Damage - chronic, secondary to cumulative UV radiation exposure/sun exposure over time - diffuse scaly erythematous macules with underlying dyspigmentation - Recommend daily broad spectrum sunscreen SPF 30+ to sun-exposed areas, reapply every 2 hours as needed.  - Recommend staying in the shade or wearing long sleeves, sun glasses (UVA+UVB protection) and wide brim hats (4-inch brim around the entire circumference of the hat). - Call for new or changing lesions.  Lentigines - Scattered tan macules - Due to sun exposure -  Benign-appering, observe - Recommend daily broad spectrum sunscreen SPF 30+ to sun-exposed areas, reapply every 2 hours as needed. - Call for any changes  Seborrheic Keratoses - Stuck-on, waxy, tan-brown papules and/or plaques  - Benign-appearing - Discussed benign etiology and prognosis. - Observe - Call for any changes  Melanocytic Nevi - Tan-brown and/or pink-flesh-colored symmetric macules and papules - Benign appearing on exam today - Observation - Call clinic for new or changing moles - Recommend daily use of broad spectrum spf 30+ sunscreen to sun-exposed areas.   Hemangiomas - Red papules - Discussed benign nature - Observe - Call for any changes  Varicose Veins/Spider Veins - Dilated blue, purple or red veins at the lower extremities - Reassured - Smaller vessels can be treated by sclerotherapy (a procedure to inject a medicine into the veins to make them disappear) if desired, but the treatment is not covered by insurance. Larger vessels may be covered if symptomatic and we would refer to vascular surgeon if treatment desired.  History of Dysplastic Nevus - No evidence of recurrence today of the right upper back - Recommend regular full body skin exams - Recommend daily broad spectrum sunscreen SPF 30+ to sun-exposed areas, reapply every 2 hours as needed.  - Call if any new or changing lesions are noted between office visits  Acrochordons (Skin Tags) - Fleshy, skin-colored pedunculated papules of the neck - Benign appearing.  - Observe. - If desired, they can be removed with an in office procedure that is not covered by insurance. - Please call the clinic if you notice any new or  changing lesions.  History of PreCancerous Actinic Keratosis  - site(s) of PreCancerous Actinic Keratosis clear today of the right cheek - these may recur and new lesions may form requiring treatment to prevent transformation into skin cancer - observe for new or changing spots and  contact [place] for appointment if occur - photoprotection with sun protective clothing; sunglasses and broad spectrum sunscreen with SPF of at least 30 + and frequent self skin exams recommended - yearly exams by a dermatologist recommended for persons with history of PreCancerous Actinic Keratoses   Nevus Right Anterior Thigh  Benign-appearing.  Stable. Observation.  Call clinic for new or changing moles.  Recommend daily use of broad spectrum spf 30+ sunscreen to sun-exposed areas.   Venous lake Right Lower Vermilion Lip  Benign, observe.    Milia face, chest  Improving.  Continue tretinoin 0.1% cream qhs face and chest as tolerated dsp 45g 3Rf.   Topical retinoid medications like tretinoin/Retin-A, adapalene/Differin, tazarotene/Fabior, and Epiduo/Epiduo Forte can cause dryness and irritation when first started. Only apply a pea-sized amount to the entire affected area. Avoid applying it around the eyes, edges of mouth and creases at the nose. If you experience irritation, use a good moisturizer first and/or apply the medicine less often. If you are doing well with the medicine, you can increase how often you use it until you are applying every night. Be careful with sun protection while using this medication as it can make you sensitive to the sun. This medicine should not be used by pregnant women.    Inflamed seborrheic keratosis Left Upper Back  Destruction of lesion - Left Upper Back  Destruction method: cryotherapy   Informed consent: discussed and consent obtained   Lesion destroyed using liquid nitrogen: Yes   Region frozen until ice ball extended beyond lesion: Yes   Outcome: patient tolerated procedure well with no complications   Post-procedure details: wound care instructions given   Additional details:  Prior to procedure, discussed risks of blister formation, small wound, skin dyspigmentation, or rare scar following cryotherapy. Recommend Vaseline  ointment to treated areas while healing.   AK (actinic keratosis) L nasal tip  Actinic keratoses are precancerous spots that appear secondary to cumulative UV radiation exposure/sun exposure over time. They are chronic with expected duration over 1 year. A portion of actinic keratoses will progress to squamous cell carcinoma of the skin. It is not possible to reliably predict which spots will progress to skin cancer and so treatment is recommended to prevent development of skin cancer.  Recommend daily broad spectrum sunscreen SPF 30+ to sun-exposed areas, reapply every 2 hours as needed.  Recommend staying in the shade or wearing long sleeves, sun glasses (UVA+UVB protection) and wide brim hats (4-inch brim around the entire circumference of the hat). Call for new or changing lesions.  Destruction of lesion - L nasal tip  Destruction method: cryotherapy   Informed consent: discussed and consent obtained   Lesion destroyed using liquid nitrogen: Yes   Region frozen until ice ball extended beyond lesion: Yes   Outcome: patient tolerated procedure well with no complications   Post-procedure details: wound care instructions given   Additional details:  Prior to procedure, discussed risks of blister formation, small wound, skin dyspigmentation, or rare scar following cryotherapy. Recommend Vaseline ointment to treated areas while healing.   Return in about 1 year (around 07/24/2022) for TBSE.  IJamesetta Orleans, CMA, am acting as scribe for [name], MD .  Documentation: I have reviewed the above documentation for accuracy and completeness, and I agree with the above.  [name] MD

## 2021-07-30 DIAGNOSIS — J209 Acute bronchitis, unspecified: Secondary | ICD-10-CM | POA: Diagnosis not present

## 2021-07-31 DIAGNOSIS — G4730 Sleep apnea, unspecified: Secondary | ICD-10-CM | POA: Diagnosis not present

## 2021-07-31 DIAGNOSIS — I10 Essential (primary) hypertension: Secondary | ICD-10-CM | POA: Diagnosis not present

## 2021-07-31 DIAGNOSIS — I5022 Chronic systolic (congestive) heart failure: Secondary | ICD-10-CM | POA: Diagnosis not present

## 2021-07-31 DIAGNOSIS — E782 Mixed hyperlipidemia: Secondary | ICD-10-CM | POA: Diagnosis not present

## 2021-07-31 DIAGNOSIS — I420 Dilated cardiomyopathy: Secondary | ICD-10-CM | POA: Diagnosis not present

## 2021-10-30 NOTE — Progress Notes (Signed)
Established patient visit   Patient: Dawn Roberts   DOB: [birth year]   76 y.o. Female  MRN: [id number] Visit Date: 10/31/2021  Today's healthcare provider: [name], FNP   No chief complaint on file.  Subjective    HPI  Hypertension, follow-up  BP Readings from Last 3 Encounters:  10/31/21 (!) 125/47  04/30/21 121/65  02/04/20 120/69   Wt Readings from Last 3 Encounters:  10/31/21 147 lb (66.7 kg)  04/30/21 145 lb (65.8 kg)  02/04/20 143 lb (64.9 kg)     She was last seen for hypertension 6 months ago.  BP at that visit was as above. Management since that visit includes none.  She reports good compliance with treatment. She is not having side effects.  She is following a Regular diet. She is not exercising. She does not smoke.  Use of agents associated with hypertension: none.   Outside blood pressures are not being checked Symptoms: No chest pain No chest pressure  No palpitations No syncope  No dyspnea No orthopnea  No paroxysmal nocturnal dyspnea No lower extremity edema   Pertinent labs: Lab Results  Component Value Date   CHOL 165 04/30/2021   HDL 46 04/30/2021   LDLCALC 104 (H) 04/30/2021   TRIG 81 04/30/2021   CHOLHDL 3.6 04/30/2021   Lab Results  Component Value Date   NA 144 04/30/2021   K 4.4 04/30/2021   CREATININE 1.04 (H) 04/30/2021   EGFR 56 (L) 04/30/2021   GLUCOSE 107 (H) 04/30/2021   TSH 1.200 02/04/2020     The 10-year ASCVD risk score (Arnett DK, et al., 2019) is: 38%   --------------------------------------------------------------------------------------------------- Diabetes Mellitus Type II, Follow-up  Lab Results  Component Value Date   HGBA1C 6.4 (H) 04/30/2021   HGBA1C 6.1 (H) 02/04/2020   HGBA1C 5.9 (H) 02/17/2019   Wt Readings from Last 3 Encounters:  10/31/21 147 lb (66.7 kg)  04/30/21 145 lb (65.8 kg)  02/04/20 143 lb (64.9 kg)   Last seen for diabetes 6 months ago.  Management since then includes  none. She reports good compliance with treatment. She is not having side effects.  Symptoms: No fatigue No foot ulcerations  No appetite changes No nausea  No paresthesia of the feet  No polydipsia  No polyuria No visual disturbances   No vomiting     Home blood sugar records:  not being checked regularly  Episodes of hypoglycemia? No    Current insulin regiment: none Most Recent Eye Exam: greater than 1 year Current exercise: none   Pertinent Labs: Lab Results  Component Value Date   CHOL 165 04/30/2021   HDL 46 04/30/2021   LDLCALC 104 (H) 04/30/2021   TRIG 81 04/30/2021   CHOLHDL 3.6 04/30/2021   Lab Results  Component Value Date   NA 144 04/30/2021   K 4.4 04/30/2021   CREATININE 1.04 (H) 04/30/2021   EGFR 56 (L) 04/30/2021   MICROALBUR 20 01/28/2017     ---------------------------------------------------------------------------------------------------   Medications: Outpatient Medications Prior to Visit  Medication Sig   aspirin EC 81 MG tablet Take 81 mg by mouth daily with supper.   Bioflavonoid Products (VITAMIN C) CHEW Chew 1 tablet by mouth 3 (three) times a week.   Calcium Carb-Cholecalciferol (CALCIUM + D3 PO) Take 1 tablet by mouth 2 (two) times daily.   carvedilol (COREG) 6.25 MG tablet Take 6.25 mg by mouth 2 (two) times daily.   losartan (COZAAR) 50 MG tablet  Take 50 mg by mouth daily.    lovastatin (MEVACOR) 40 MG tablet Take 40 mg by mouth at bedtime.    Misc Natural Products (OSTEO BI-FLEX ADV JOINT SHIELD PO) Take 1 tablet by mouth 2 (two) times daily.    Multiple Vitamin (MULTIVITAMIN WITH MINERALS) TABS tablet Take 1 tablet by mouth daily with lunch.   spironolactone (ALDACTONE) 25 MG tablet Take 25 mg by mouth daily.   tretinoin (RETIN-A) 0.1 % cream APPLY 1 APPLICATION TO AFFECTED AREAS ON FACE AND CHEST AT BEDTIME EVERY OTHER NIGHT.   vitamin B-12 (CYANOCOBALAMIN) 1000 MCG tablet Take 1,000 mcg by mouth daily.   No facility-administered  medications prior to visit.    Review of Systems      Objective    BP (!) 125/47    Pulse (!) 57    Temp 98.2 F (36.8 C) (Oral)    Wt 147 lb (66.7 kg)    SpO2 99%    BMI 23.73 kg/m    Physical Exam Vitals and nursing note reviewed.  Constitutional:      General: She is not in acute distress.    Appearance: Normal appearance. She is normal weight. She is not ill-appearing, toxic-appearing or diaphoretic.  HENT:     Head: Normocephalic and atraumatic.  Eyes:     General: Allergic shiner present.        Right eye: Discharge present.        Left eye: Discharge present.    Conjunctiva/sclera:     Right eye: Right conjunctiva is injected.     Left eye: Left conjunctiva is injected.     Comments: C/o watery, allergic eyes  Cardiovascular:     Rate and Rhythm: Normal rate and regular rhythm.     Pulses: Normal pulses.     Heart sounds: Normal heart sounds. No murmur heard.   No friction rub. No gallop.  Pulmonary:     Effort: Pulmonary effort is normal. No respiratory distress.     Breath sounds: Normal breath sounds. No stridor. No wheezing, rhonchi or rales.  Chest:     Chest wall: No tenderness.  Abdominal:     General: Bowel sounds are normal.     Palpations: Abdomen is soft.  Musculoskeletal:        General: No swelling, tenderness, deformity or signs of injury. Normal range of motion.     Right lower leg: No edema.     Left lower leg: No edema.  Skin:    General: Skin is warm and dry.     Capillary Refill: Capillary refill takes less than 2 seconds.     Coloration: Skin is not jaundiced or pale.     Findings: No bruising, erythema, lesion or rash.  Neurological:     General: No focal deficit present.     Mental Status: She is alert and oriented to person, place, and time. Mental status is at baseline.     Cranial Nerves: No cranial nerve deficit.     Sensory: No sensory deficit.     Motor: No weakness.     Coordination: Coordination normal.  Psychiatric:         Mood and Affect: Mood normal.        Behavior: Behavior normal.        Thought Content: Thought content normal.        Judgment: Judgment normal.     No results found for any visits on 10/31/21.  Assessment & Plan  Problem List Items Addressed This Visit       Cardiovascular and Mediastinum   Hypertension associated with diabetes ([place]) - Primary    Chronic, stable Hx of CM; f/b Cards, seen q6 months Denies CP Denies SOB Denies DOE No LE Edema noted on exam Continue medication Refills stable Seek emergent care if you develop CP, chest pain or chest pressure       Relevant Orders   Comprehensive metabolic panel     Respiratory   Allergic rhinitis    Chronic, complaints of watery eyes Recommend use of flonase Did not want Rx- will get OTC Encourage allergy testing in future        Endocrine   Hyperlipidemia associated with type 2 diabetes mellitus ([employer])    Goal of LDL <70 Repeat lipid panel today      Relevant Orders   Lipid panel   Diabetes mellitus ([place])    Encouraged less intake of sweetened beverages, ex sweet tea Repeat A1c, has been stable Continue to recommend balanced, lower carb meals. Smaller meal size, adding snacks. Choosing water as drink of choice and increasing purposeful exercise.       Relevant Orders   Ambulatory referral to Ophthalmology   Hemoglobin A1c     Genitourinary   Stage 3a chronic kidney disease ([place])    New development, failed faxiga d/t SE Check Chem today- consider alt agent if no hyperkalemia present Pt willing to try additional agent following discussion        Other   Chronic bilateral low back pain without sciatica    Chronic, stable Follows with chiro q 1 month No current complaints        Return in about 6 months (around 04/30/2022) for annual examination.      [place] Ponder [place],acting as a scribe for [name], FNP.,have documented all relevant documentation on the behalf of [name], FNP,as  directed by  [name], FNP while in the presence of [name], FNP.  Vonna Kotyk, FNP, have reviewed all documentation for this visit. The documentation on 10/31/21 for the exam, diagnosis, procedures, and orders are all accurate and complete.    [name], [place] [phone] (phone) [phone] (fax)  [place]

## 2021-10-31 ENCOUNTER — Encounter: Payer: Self-pay | Admitting: Family Medicine

## 2021-10-31 ENCOUNTER — Other Ambulatory Visit: Payer: Self-pay

## 2021-10-31 ENCOUNTER — Ambulatory Visit (INDEPENDENT_AMBULATORY_CARE_PROVIDER_SITE_OTHER): Payer: PPO | Admitting: Family Medicine

## 2021-10-31 VITALS — BP 125/47 | HR 57 | Temp 98.2°F | Wt 147.0 lb

## 2021-10-31 DIAGNOSIS — E119 Type 2 diabetes mellitus without complications: Secondary | ICD-10-CM | POA: Insufficient documentation

## 2021-10-31 DIAGNOSIS — J3089 Other allergic rhinitis: Secondary | ICD-10-CM | POA: Diagnosis not present

## 2021-10-31 DIAGNOSIS — I152 Hypertension secondary to endocrine disorders: Secondary | ICD-10-CM | POA: Diagnosis not present

## 2021-10-31 DIAGNOSIS — N1831 Chronic kidney disease, stage 3a: Secondary | ICD-10-CM | POA: Diagnosis not present

## 2021-10-31 DIAGNOSIS — E785 Hyperlipidemia, unspecified: Secondary | ICD-10-CM

## 2021-10-31 DIAGNOSIS — E1159 Type 2 diabetes mellitus with other circulatory complications: Secondary | ICD-10-CM

## 2021-10-31 DIAGNOSIS — E1169 Type 2 diabetes mellitus with other specified complication: Secondary | ICD-10-CM | POA: Diagnosis not present

## 2021-10-31 DIAGNOSIS — M545 Low back pain, unspecified: Secondary | ICD-10-CM | POA: Insufficient documentation

## 2021-10-31 DIAGNOSIS — G8929 Other chronic pain: Secondary | ICD-10-CM | POA: Diagnosis not present

## 2021-10-31 NOTE — Assessment & Plan Note (Signed)
Chronic, complaints of watery eyes Recommend use of flonase Did not want Rx- will get OTC Encourage allergy testing in future

## 2021-10-31 NOTE — Assessment & Plan Note (Signed)
Chronic, stable Hx of CM; f/b Cards, seen q6 months Denies CP Denies SOB Denies DOE No LE Edema noted on exam Continue medication Refills stable Seek emergent care if you develop CP, chest pain or chest pressure

## 2021-10-31 NOTE — Assessment & Plan Note (Signed)
Goal of LDL <70 Repeat lipid panel today

## 2021-10-31 NOTE — Assessment & Plan Note (Signed)
New development, failed faxiga d/t SE Check Chem today- consider alt agent if no hyperkalemia present Pt willing to try additional agent following discussion

## 2021-10-31 NOTE — Assessment & Plan Note (Signed)
Chronic, stable Follows with chiro q 1 month No current complaints

## 2021-10-31 NOTE — Assessment & Plan Note (Signed)
Encouraged less intake of sweetened beverages, ex sweet tea Repeat A1c, has been stable Continue to recommend balanced, lower carb meals. Smaller meal size, adding snacks. Choosing water as drink of choice and increasing purposeful exercise.

## 2021-11-01 ENCOUNTER — Other Ambulatory Visit: Payer: Self-pay

## 2021-11-01 ENCOUNTER — Other Ambulatory Visit: Payer: Self-pay | Admitting: Family Medicine

## 2021-11-01 DIAGNOSIS — Z794 Long term (current) use of insulin: Secondary | ICD-10-CM

## 2021-11-01 DIAGNOSIS — E1122 Type 2 diabetes mellitus with diabetic chronic kidney disease: Secondary | ICD-10-CM | POA: Insufficient documentation

## 2021-11-01 DIAGNOSIS — N1832 Chronic kidney disease, stage 3b: Secondary | ICD-10-CM | POA: Insufficient documentation

## 2021-11-01 LAB — HEMOGLOBIN A1C
Est. average glucose Bld gHb Est-mCnc: 140 mg/dL
Hgb A1c MFr Bld: 6.5 % — ABNORMAL HIGH (ref 4.8–5.6)

## 2021-11-01 LAB — COMPREHENSIVE METABOLIC PANEL
ALT: 27 IU/L (ref 0–32)
AST: 22 IU/L (ref 0–40)
Albumin/Globulin Ratio: 2 (ref 1.2–2.2)
Albumin: 4.4 g/dL (ref 3.7–4.7)
Alkaline Phosphatase: 44 IU/L (ref 44–121)
BUN/Creatinine Ratio: 18 (ref 12–28)
BUN: 23 mg/dL (ref 8–27)
Bilirubin Total: 0.3 mg/dL (ref 0.0–1.2)
CO2: 27 mmol/L (ref 20–29)
Calcium: 10.6 mg/dL — ABNORMAL HIGH (ref 8.7–10.3)
Chloride: 104 mmol/L (ref 96–106)
Creatinine, Ser: 1.27 mg/dL — ABNORMAL HIGH (ref 0.57–1.00)
Globulin, Total: 2.2 g/dL (ref 1.5–4.5)
Glucose: 108 mg/dL — ABNORMAL HIGH (ref 70–99)
Potassium: 4.4 mmol/L (ref 3.5–5.2)
Sodium: 144 mmol/L (ref 134–144)
Total Protein: 6.6 g/dL (ref 6.0–8.5)
eGFR: 44 mL/min/{1.73_m2} — ABNORMAL LOW (ref >59)

## 2021-11-01 LAB — LIPID PANEL
Chol/HDL Ratio: 2.4 ratio (ref 0.0–4.4)
Cholesterol, Total: 119 mg/dL (ref 100–199)
HDL: 50 mg/dL (ref >39)
LDL Chol Calc (NIH): 55 mg/dL (ref 0–99)
Triglycerides: 67 mg/dL (ref 0–149)
VLDL Cholesterol Cal: 14 mg/dL (ref 5–40)

## 2021-11-01 MED ORDER — KERENDIA 10 MG PO TABS: 10.0000 mg | ORAL_TABLET | Freq: Every day | ORAL | 1 refills | Status: DC

## 2021-11-01 MED ORDER — METFORMIN HCL ER 750 MG PO TB24: 750.0000 mg | ORAL_TABLET | Freq: Every day | ORAL | 3 refills | Status: DC

## 2021-12-04 ENCOUNTER — Telehealth: Payer: Self-pay

## 2021-12-04 ENCOUNTER — Other Ambulatory Visit: Payer: Self-pay | Admitting: Family Medicine

## 2021-12-04 DIAGNOSIS — N1832 Chronic kidney disease, stage 3b: Secondary | ICD-10-CM

## 2021-12-04 NOTE — Telephone Encounter (Signed)
Copied from [place] [id number]. Topic: General - Other ?>> Dec 04, 2021  9:08 AM Tessa Lerner A wrote: ?Reason for CRM: The patient has taken their [facility]) 10 MG TABS [[id number]]  for one month  ? ?The patient has experienced no side effects  ? ?The patient has called the practice, as directed, to request orders for lab work to recheck their potassium  ? ?Please contact further when possible ?

## 2021-12-05 ENCOUNTER — Other Ambulatory Visit: Payer: Self-pay

## 2021-12-05 DIAGNOSIS — N1831 Chronic kidney disease, stage 3a: Secondary | ICD-10-CM

## 2021-12-06 DIAGNOSIS — N1832 Chronic kidney disease, stage 3b: Secondary | ICD-10-CM | POA: Diagnosis not present

## 2021-12-07 LAB — COMPREHENSIVE METABOLIC PANEL
ALT: 24 IU/L (ref 0–32)
AST: 26 IU/L (ref 0–40)
Albumin/Globulin Ratio: 1.9 (ref 1.2–2.2)
Albumin: 4.2 g/dL (ref 3.7–4.7)
Alkaline Phosphatase: 41 IU/L — ABNORMAL LOW (ref 44–121)
BUN/Creatinine Ratio: 21 (ref 12–28)
BUN: 26 mg/dL (ref 8–27)
Bilirubin Total: 0.2 mg/dL (ref 0.0–1.2)
CO2: 23 mmol/L (ref 20–29)
Calcium: 10.5 mg/dL — ABNORMAL HIGH (ref 8.7–10.3)
Chloride: 100 mmol/L (ref 96–106)
Creatinine, Ser: 1.24 mg/dL — ABNORMAL HIGH (ref 0.57–1.00)
Globulin, Total: 2.2 g/dL (ref 1.5–4.5)
Glucose: 105 mg/dL — ABNORMAL HIGH (ref 70–99)
Potassium: 4.3 mmol/L (ref 3.5–5.2)
Sodium: 140 mmol/L (ref 134–144)
Total Protein: 6.4 g/dL (ref 6.0–8.5)
eGFR: 45 mL/min/{1.73_m2} — ABNORMAL LOW (ref >59)

## 2021-12-14 ENCOUNTER — Telehealth: Payer: Self-pay | Admitting: Family Medicine

## 2021-12-14 ENCOUNTER — Other Ambulatory Visit: Payer: Self-pay | Admitting: Family Medicine

## 2021-12-14 DIAGNOSIS — N1832 Chronic kidney disease, stage 3b: Secondary | ICD-10-CM

## 2021-12-14 DIAGNOSIS — N1831 Chronic kidney disease, stage 3a: Secondary | ICD-10-CM

## 2021-12-14 MED ORDER — DAPAGLIFLOZIN PROPANEDIOL 10 MG PO TABS: 10.0000 mg | ORAL_TABLET | Freq: Every day | ORAL | 1 refills | Status: DC

## 2021-12-14 NOTE — Telephone Encounter (Signed)
Patient ha been advised. KW ?

## 2021-12-14 NOTE — Telephone Encounter (Signed)
Pt is calling to report that the [facility]) 10 [place] [[id number] is not approved by her insurance is $700. Is there another medication that the patient can take. ? ?[phone] ?

## 2022-01-26 ENCOUNTER — Other Ambulatory Visit: Payer: Self-pay | Admitting: Family Medicine

## 2022-01-28 NOTE — Telephone Encounter (Signed)
Requested Prescriptions  ?Pending Prescriptions Disp Refills  ?? metFORMIN (GLUCOPHAGE-XR) 750 MG 24 hr tablet [Pharmacy Med Name: METFORMIN HCL ER 750 MG TABLET] 90 tablet 1  ?  Sig: TAKE 1 TABLET BY MOUTH EVERY DAY WITH BREAKFAST  ?  ? Endocrinology:  Diabetes - Biguanides Failed - 01/26/2022 12:39 PM  ?  ?  Failed - Cr in normal range and within 360 days  ?  Creatinine, Ser  ?Date Value Ref Range Status  ?12/06/2021 1.24 (H) 0.57 - 1.00 mg/dL Final  ?   ?  ?  Failed - eGFR in normal range and within 360 days  ?  GFR calc Af Amer  ?Date Value Ref Range Status  ?02/04/2020 50 (L) >59 mL/min/1.73 Final  ?  Comment:  ?  **[facility] currently reports eGFR in compliance with the current** ?  recommendations of the [employer]. [facility] will ?  update reporting as new guidelines are published from the NKF-ASN ?  Task force. ?  ? ?GFR calc non Af Amer  ?Date Value Ref Range Status  ?02/04/2020 44 (L) >59 mL/min/1.73 Final  ? ?eGFR  ?Date Value Ref Range Status  ?12/06/2021 45 (L) >59 mL/min/1.73 Final  ?   ?  ?  Failed - B12 Level in normal range and within 720 days  ?  No results found for: VITAMINB12   ?  ?  Failed - CBC within normal limits and completed in the last 12 months  ?  WBC  ?Date Value Ref Range Status  ?02/04/2020 5.5 3.4 - 10.8 x10E3/uL Final  ? ?RBC  ?Date Value Ref Range Status  ?02/04/2020 3.91 3.77 - 5.28 x10E6/uL Final  ? ?Hemoglobin  ?Date Value Ref Range Status  ?02/04/2020 11.7 11.1 - 15.9 g/dL Final  ? ?Hematocrit  ?Date Value Ref Range Status  ?02/04/2020 35.4 34.0 - 46.6 % Final  ? ?MCHC  ?Date Value Ref Range Status  ?02/04/2020 33.1 31.5 - 35.7 g/dL Final  ? ?MCH  ?Date Value Ref Range Status  ?02/04/2020 29.9 26.6 - 33.0 pg Final  ? ?MCV  ?Date Value Ref Range Status  ?02/04/2020 91 79 - 97 fL Final  ? ?No results found for: PLTCOUNTKUC, LABPLAT, POCPLA ?RDW  ?Date Value Ref Range Status  ?02/04/2020 12.1 11.7 - 15.4 % Final  ? ?  ?  ?  Passed - HBA1C is between 0 and 7.9 and  within 180 days  ?  Hgb A1c MFr Bld  ?Date Value Ref Range Status  ?10/31/2021 6.5 (H) 4.8 - 5.6 % Final  ?  Comment:  ?           Prediabetes: 5.7 - 6.4 ?         Diabetes: >6.4 ?         Glycemic control for adults with diabetes: <7.0 ?  ?   ?  ?  Passed - Valid encounter within last 6 months  ?  Recent Outpatient Visits   ?      ? 2 months ago Hypertension associated with diabetes (HCC)  ? [facility] Payne, Elise T, FNP  ? 9 months ago Encounter for annual wellness visit (AWV) in Medicare patient  ? [facility] Bacigalupo, Angela M, MD  ? 1 year ago Diabetes mellitus with no complication (HCC)  ? [facility] Burnette, Jennifer M, PA-C  ? 2 years ago Annual physical exam  ?  [facility] Jennifer M, PA-C  ? 3 years   ago Bronchitis  ? [facility] Burnette, Jennifer M, PA-C  ?  ?  ?Future Appointments   ?        ? In 3 months Payne, Elise T, FNP [facility]  ?  ? ?  ?  ?  ? ? ?

## 2022-01-30 DIAGNOSIS — I420 Dilated cardiomyopathy: Secondary | ICD-10-CM | POA: Diagnosis not present

## 2022-01-30 DIAGNOSIS — I5022 Chronic systolic (congestive) heart failure: Secondary | ICD-10-CM | POA: Diagnosis not present

## 2022-01-30 DIAGNOSIS — I10 Essential (primary) hypertension: Secondary | ICD-10-CM | POA: Diagnosis not present

## 2022-01-30 DIAGNOSIS — E782 Mixed hyperlipidemia: Secondary | ICD-10-CM | POA: Diagnosis not present

## 2022-03-09 ENCOUNTER — Encounter: Payer: Self-pay | Admitting: Emergency Medicine

## 2022-03-09 ENCOUNTER — Ambulatory Visit
Admission: EM | Admit: 2022-03-09 | Discharge: 2022-03-09 | Disposition: A | Discharge status: Home / Self Care | Payer: PPO | Attending: Family Medicine | Admitting: Family Medicine

## 2022-03-09 ENCOUNTER — Other Ambulatory Visit: Payer: Self-pay

## 2022-03-09 DIAGNOSIS — H6982 Other specified disorders of Eustachian tube, left ear: Secondary | ICD-10-CM | POA: Diagnosis not present

## 2022-03-09 MED ORDER — PREDNISONE 20 MG PO TABS: 40.0000 mg | ORAL_TABLET | Freq: Every day | ORAL | 0 refills | Status: DC

## 2022-03-09 NOTE — ED Triage Notes (Signed)
Pt reports left ear "pressure" x1 week. [place] and reports left ear never popped and reports constant "roaring" sound and echo with hearing on that side.

## 2022-03-09 NOTE — Discharge Instructions (Addendum)
Be aware, your blood sugar will increase while taking Rx prednisone.

## 2022-03-11 NOTE — ED Provider Notes (Signed)
[facility]   [id number] 03/09/22 Arrival Time: [place]:  1. Eustachian tube dysfunction, left    Trial of: Meds ordered this encounter  Medications   predniSONE (DELTASONE) 20 MG tablet    Sig: Take 2 tablets (40 mg total) by mouth daily.    Dispense:  10 tablet    Refill:  0   OTC symptom care as needed. Ensure adequate fluid intake and rest.  Recommend:  Follow-up Information     [facility].   Why: If worsening or failing to improve as anticipated. Contact information: [address] [place] [address]                Reviewed expectations re: course of current medical issues. Questions answered. Outlined signs and symptoms indicating need for more acute intervention. Patient verbalized understanding. After Visit Summary given.   SUBJECTIVE: History from: patient.  Dawn Roberts is a 76 y.o. female who reports left ear "pressure" x1 week. [place] and reports left ear never popped and reports constant "roaring" sound and echo with hearing on that side. No hearing loss. No ear drainage/bleeding. Otherwise well.  Social History   Tobacco Use  Smoking Status Never  Smokeless Tobacco Never    OBJECTIVE:  Vitals:   03/09/22 0940  BP: 121/73  Pulse: 68  Resp: 18  Temp: 97.8 F (36.6 C)  TempSrc: Oral  SpO2: 97%    General appearance: alert; no distress HEENT: nasal congestion; clear runny nose; bilateral serous otitis (L>R) Neck: supple without LAD; trachea midline Lungs: unlabored respirations, symmetrical air entry; cough: absent; no respiratory distress Skin: warm and dry Psychological: alert and cooperative; normal mood and affect  No Known Allergies  Past Medical History:  Diagnosis Date   Allergy    Anemia    Arthritis    Cardiomegaly    CHF (congestive heart failure) (HCC)    Dysplastic nevus 07/16/2019   Right upper back. Mild  atypia, close to margin.   Hyperlipidemia    Hypertension    Sleep apnea    MILD   Family History  Problem Relation Age of Onset   Diabetes Father    Ovarian cancer Sister    Breast cancer Sister 69   Healthy Sister    Healthy Brother    Healthy Sister    Breast cancer Maternal Aunt    Social History   Socioeconomic History   Marital status: Divorced    Spouse name: Not on file   Number of children: 4   Years of education: HS   Highest education level: 12th grade  Occupational History   Occupation: retired  Tobacco Use   Smoking status: Never   Smokeless tobacco: Never  Scientific laboratory technician Use: Never used  Substance and Sexual Activity   Alcohol use: No    Alcohol/week: 0.0 standard drinks of alcohol   Drug use: No   Sexual activity: Yes    Birth control/protection: Post-menopausal    Comment: pt states, "I have a friend"  Other Topics Concern   Not on file  Social History Narrative   Not on file   Social Determinants of Health   Financial Resource Strain: Not on file  Food Insecurity: Not on file  Transportation Needs: Not on file  Physical Activity: Insufficiently Active (02/03/2020)   Exercise Vital Sign    Days of Exercise per Week: 4 days  Minutes of Exercise per Session: 20 min  Stress: Not on file  Social Connections: Moderately Isolated (02/03/2020)   Social Connection and Isolation Panel [NHANES]    Frequency of Communication with Friends and Family: More than three times a week    Frequency of Social Gatherings with Friends and Family: More than three times a week    Attends Religious Services: More than 4 times per year    Active Member of [employer] or Organizations: No    Attends Archivist Meetings: Never    Marital Status: Divorced  Human resources officer Violence: Not on file             [name], MD 03/11/22 1121

## 2022-04-12 ENCOUNTER — Other Ambulatory Visit: Payer: Self-pay | Admitting: Dentistry

## 2022-04-12 ENCOUNTER — Other Ambulatory Visit: Payer: Self-pay | Admitting: Family Medicine

## 2022-04-12 DIAGNOSIS — Z1231 Encounter for screening mammogram for malignant neoplasm of breast: Secondary | ICD-10-CM

## 2022-04-19 DIAGNOSIS — M9901 Segmental and somatic dysfunction of cervical region: Secondary | ICD-10-CM | POA: Diagnosis not present

## 2022-04-19 DIAGNOSIS — M9903 Segmental and somatic dysfunction of lumbar region: Secondary | ICD-10-CM | POA: Diagnosis not present

## 2022-04-19 DIAGNOSIS — M9904 Segmental and somatic dysfunction of sacral region: Secondary | ICD-10-CM | POA: Diagnosis not present

## 2022-04-19 DIAGNOSIS — M9902 Segmental and somatic dysfunction of thoracic region: Secondary | ICD-10-CM | POA: Diagnosis not present

## 2022-04-23 DIAGNOSIS — M9901 Segmental and somatic dysfunction of cervical region: Secondary | ICD-10-CM | POA: Diagnosis not present

## 2022-04-23 DIAGNOSIS — M9904 Segmental and somatic dysfunction of sacral region: Secondary | ICD-10-CM | POA: Diagnosis not present

## 2022-04-23 DIAGNOSIS — M9903 Segmental and somatic dysfunction of lumbar region: Secondary | ICD-10-CM | POA: Diagnosis not present

## 2022-04-23 DIAGNOSIS — M9902 Segmental and somatic dysfunction of thoracic region: Secondary | ICD-10-CM | POA: Diagnosis not present

## 2022-04-26 DIAGNOSIS — M9902 Segmental and somatic dysfunction of thoracic region: Secondary | ICD-10-CM | POA: Diagnosis not present

## 2022-04-26 DIAGNOSIS — M9904 Segmental and somatic dysfunction of sacral region: Secondary | ICD-10-CM | POA: Diagnosis not present

## 2022-04-26 DIAGNOSIS — M9901 Segmental and somatic dysfunction of cervical region: Secondary | ICD-10-CM | POA: Diagnosis not present

## 2022-04-26 DIAGNOSIS — M9903 Segmental and somatic dysfunction of lumbar region: Secondary | ICD-10-CM | POA: Diagnosis not present

## 2022-04-30 DIAGNOSIS — M9904 Segmental and somatic dysfunction of sacral region: Secondary | ICD-10-CM | POA: Diagnosis not present

## 2022-04-30 DIAGNOSIS — M9903 Segmental and somatic dysfunction of lumbar region: Secondary | ICD-10-CM | POA: Diagnosis not present

## 2022-04-30 DIAGNOSIS — M9902 Segmental and somatic dysfunction of thoracic region: Secondary | ICD-10-CM | POA: Diagnosis not present

## 2022-04-30 DIAGNOSIS — M9901 Segmental and somatic dysfunction of cervical region: Secondary | ICD-10-CM | POA: Diagnosis not present

## 2022-05-01 ENCOUNTER — Encounter: Payer: Self-pay | Admitting: Family Medicine

## 2022-05-01 ENCOUNTER — Ambulatory Visit (INDEPENDENT_AMBULATORY_CARE_PROVIDER_SITE_OTHER): Payer: PPO | Admitting: Family Medicine

## 2022-05-01 VITALS — BP 131/67 | HR 53 | Temp 98.0°F | Resp 16 | Ht 65.0 in | Wt 133.0 lb

## 2022-05-01 DIAGNOSIS — D692 Other nonthrombocytopenic purpura: Secondary | ICD-10-CM | POA: Insufficient documentation

## 2022-05-01 DIAGNOSIS — Z794 Long term (current) use of insulin: Secondary | ICD-10-CM

## 2022-05-01 DIAGNOSIS — I152 Hypertension secondary to endocrine disorders: Secondary | ICD-10-CM

## 2022-05-01 DIAGNOSIS — N1832 Chronic kidney disease, stage 3b: Secondary | ICD-10-CM

## 2022-05-01 DIAGNOSIS — E1122 Type 2 diabetes mellitus with diabetic chronic kidney disease: Secondary | ICD-10-CM

## 2022-05-01 DIAGNOSIS — R001 Bradycardia, unspecified: Secondary | ICD-10-CM | POA: Diagnosis not present

## 2022-05-01 DIAGNOSIS — E785 Hyperlipidemia, unspecified: Secondary | ICD-10-CM

## 2022-05-01 DIAGNOSIS — Z0000 Encounter for general adult medical examination without abnormal findings: Secondary | ICD-10-CM | POA: Diagnosis not present

## 2022-05-01 DIAGNOSIS — E1169 Type 2 diabetes mellitus with other specified complication: Secondary | ICD-10-CM | POA: Diagnosis not present

## 2022-05-01 DIAGNOSIS — Z1231 Encounter for screening mammogram for malignant neoplasm of breast: Secondary | ICD-10-CM

## 2022-05-01 DIAGNOSIS — I429 Cardiomyopathy, unspecified: Secondary | ICD-10-CM

## 2022-05-01 DIAGNOSIS — E1159 Type 2 diabetes mellitus with other circulatory complications: Secondary | ICD-10-CM | POA: Diagnosis not present

## 2022-05-01 NOTE — Assessment & Plan Note (Signed)
Acute, variable EKG completed SB on EKG; no comparison On BB- coreg 6.25 mg BID Denies concerns for dizziness or other side effects r/t bradycardia

## 2022-05-01 NOTE — Assessment & Plan Note (Signed)
Chronic, stable At goal Combo therapy of coreg 6.25 mg BID, Losartan 50 mg (1/2 100 mg tablet)

## 2022-05-01 NOTE — Assessment & Plan Note (Signed)
Chronic, stable Has not seen nephrology On Farxiga 10 mg Repeat CMP and urine micro

## 2022-05-01 NOTE — Assessment & Plan Note (Signed)
Chronic, previously stable Review of LDL at 54 I recommend diet low in saturated fat and regular exercise - 30 min at least 5 times per week Currently controlled on Mevacor 40 mg

## 2022-05-01 NOTE — Assessment & Plan Note (Signed)
Chronic, stable Repeat A1c Continue to recommend balanced, lower carb meals. Smaller meal size, adding snacks. Choosing water as drink of choice and increasing purposeful exercise.  

## 2022-05-01 NOTE — Assessment & Plan Note (Signed)
Chronic, stable Followed by cards- Dr Raliegh Ip Denies complaints

## 2022-05-01 NOTE — Progress Notes (Signed)
Complete physical exam  Patient: Dawn Roberts   DOB: August 22, 1946   76 y.o. Female  MRN: [id number] Visit Date: 05/01/2022  Today's healthcare provider: [name], FNP  Introduced to nurse practitioner role and practice setting.  All questions answered.  Discussed provider/patient relationship and expectations.  I,Tiffany J Bragg,acting as a scribe for [name], FNP.,have documented all relevant documentation on the behalf of [name], FNP,as directed by  [name], FNP while in the presence of [name], FNP.  Chief Complaint  Patient presents with   Annual Exam   Subjective    Dawn Roberts is a 76 y.o. female who presents today for a complete physical exam.  She reports consuming a  healthy  diet.  Exercise is gardening and walking.  She generally feels well. She reports sleeping poorly. She does not have additional problems to discuss today.   HPI   Past Medical History:  Diagnosis Date   Allergy    Anemia    Arthritis    Cardiomegaly    CHF (congestive heart failure) ([place])    Dysplastic nevus 07/16/2019   Right upper back. Mild atypia, close to margin.   Hyperlipidemia    Hypertension    Sleep apnea    MILD   Past Surgical History:  Procedure Laterality Date   BREAST EXCISIONAL BIOPSY Right 1976   BREAST MASS EXCISION Right 1976   benign   CATARACT EXTRACTION W/PHACO Left 07/29/2017   Procedure: CATARACT EXTRACTION PHACO AND INTRAOCULAR LENS PLACEMENT (IOC)-LEFT;  Surgeon: Birder Robson, MD;  Location: [facility] ORS;  Service: Ophthalmology;  Laterality: Left;  [place] 00:49 AP% 15.6 CDE 7.78 Fluid pack lot # [id number]   CATARACT EXTRACTION W/PHACO Right 09/02/2017   Procedure: CATARACT EXTRACTION PHACO AND INTRAOCULAR LENS PLACEMENT (IOC);  Surgeon: Birder Robson, MD;  Location: [facility] ORS;  Service: Ophthalmology;  Laterality: Right;  [place] 00:58.7 AP% 18.0 CDE 10.58 Fluid Pack lot # [id number]   COLONOSCOPY  03-29-05   Dr [name]    COLONOSCOPY N/A 02/01/2015   Procedure: COLONOSCOPY;  Surgeon: [name], MD;  Location: [facility] END[facility];  Service: Endoscopy;  Laterality: N/A;   venous closure procedure Right 07-25-05   Dr. [name]   Social History   Socioeconomic History   Marital status: Divorced    Spouse name: Not on file   Number of children: 4   Years of education: HS   Highest education level: 12th grade  Occupational History   Occupation: retired  Tobacco Use   Smoking status: Never   Smokeless tobacco: Never  Vaping Use   Vaping Use: Never used  Substance and Sexual Activity   Alcohol use: No    Alcohol/week: 0.0 standard drinks of alcohol   Drug use: No   Sexual activity: Yes    Birth control/protection: Post-menopausal    Comment: pt states, "I have a friend"  Other Topics Concern   Not on file  Social History Narrative   Not on file   Social Determinants of Health   Financial Resource Strain: Low Risk  (02/03/2020)   Overall Financial Resource Strain (CARDIA)    Difficulty of Paying Living Expenses: Not hard at all  Food Insecurity: No Food Insecurity (03/23/2020)   Hunger Vital Sign    Worried About Running Out of Food in the Last Year: Never true    [place] in the Last Year: Never true  Transportation Needs: No Transportation Needs (03/23/2020)  PRAPARE - Hydrologist (Medical): No    Lack of Transportation (Non-Medical): No  Physical Activity: Insufficiently Active (02/03/2020)   Exercise Vital Sign    Days of Exercise per Week: 4 days    Minutes of Exercise per Session: 20 min  Stress: No Stress Concern Present (02/03/2020)   [place]    Feeling of Stress : Only a little  Social Connections: Moderately Isolated (02/03/2020)   Social Connection and Isolation Panel [NHANES]    Frequency of Communication with Friends and Family: More than three times a week    Frequency of  Social Gatherings with Friends and Family: More than three times a week    Attends Religious Services: More than 4 times per year    Active Member of [employer] or Organizations: No    Attends Archivist Meetings: Never    Marital Status: Divorced  Human resources officer Violence: Not At Risk (02/03/2020)   Humiliation, Afraid, Rape, and Kick questionnaire    Fear of Current or Ex-Partner: No    Emotionally Abused: No    Physically Abused: No    Sexually Abused: No   Family Status  Relation Name Status   Father  Deceased at age 86   Sister  Deceased at age 19       ovarian cancer   Mother  Deceased at age 37   Sister  69   Brother  Alive   Sister  Ecologist  (Not Specified)   Family History  Problem Relation Age of Onset   Diabetes Father    Ovarian cancer Sister    Breast cancer Sister 54   Healthy Sister    Healthy Brother    Healthy Sister    Breast cancer Maternal Aunt    Allergies  Allergen Reactions   Empagliflozin Other (See Comments)    Yeast infection    Patient Care Team: [name], FNP as PCP - General (Family Medicine) Corey Skains, MD as Consulting Physician (Cardiology) [name], MD as Consulting Physician (Ophthalmology) Birder Robson, MD as Referring Physician (Ophthalmology)   Medications: Outpatient Medications Prior to Visit  Medication Sig   aspirin EC 81 MG tablet Take 81 mg by mouth daily with supper.   Bioflavonoid Products (VITAMIN C) CHEW Chew 1 tablet by mouth 3 (three) times a week.   Calcium Carb-Cholecalciferol (CALCIUM + D3 PO) Take 1 tablet by mouth 2 (two) times daily.   carvedilol (COREG) 6.25 MG tablet Take 6.25 mg by mouth 2 (two) times daily.   dapagliflozin propanediol (FARXIGA) 10 MG TABS tablet Take 1 tablet (10 mg total) by mouth daily before breakfast.   losartan (COZAAR) 100 MG tablet Take 0.5 tablets by mouth daily.   losartan (COZAAR) 50 MG tablet Take 50 mg by mouth daily.    lovastatin  (MEVACOR) 40 MG tablet TAKE 1 TABLET BY MOUTH EVERY DAY WITH DINNER   metFORMIN (GLUCOPHAGE-XR) 750 MG 24 hr tablet TAKE 1 TABLET BY MOUTH EVERY DAY WITH BREAKFAST   metFORMIN (GLUCOPHAGE-XR) 750 MG 24 hr tablet Take 1 tablet by mouth daily with breakfast.   Misc Natural Products (OSTEO BI-FLEX ADV JOINT SHIELD PO) Take 1 tablet by mouth 2 (two) times daily.    Multiple Vitamin (MULTIVITAMIN WITH MINERALS) TABS tablet Take 1 tablet by mouth daily with lunch.   predniSONE (DELTASONE) 20 MG tablet Take 2 tablets (40 mg total) by  mouth daily.   spironolactone (ALDACTONE) 25 MG tablet Take 25 mg by mouth daily.   tretinoin (RETIN-A) 0.1 % cream APPLY 1 APPLICATION TO AFFECTED AREAS ON FACE AND CHEST AT BEDTIME EVERY OTHER NIGHT.   vitamin B-12 (CYANOCOBALAMIN) 1000 MCG tablet Take 1,000 mcg by mouth daily.   lovastatin (MEVACOR) 40 MG tablet Take 40 mg by mouth at bedtime.    No facility-administered medications prior to visit.    Review of Systems  Last CBC Lab Results  Component Value Date   WBC 5.5 02/04/2020   HGB 11.7 02/04/2020   HCT 35.4 02/04/2020   MCV 91 02/04/2020   MCH 29.9 02/04/2020   RDW 12.1 02/04/2020   PLT 250 56/38/9373   Last metabolic panel Lab Results  Component Value Date   GLUCOSE 105 (H) 12/06/2021   NA 140 12/06/2021   K 4.3 12/06/2021   CL 100 12/06/2021   CO2 23 12/06/2021   BUN 26 12/06/2021   CREATININE 1.24 (H) 12/06/2021   EGFR 45 (L) 12/06/2021   CALCIUM 10.5 (H) 12/06/2021   PROT 6.4 12/06/2021   ALBUMIN 4.2 12/06/2021   LABGLOB 2.2 12/06/2021   AGRATIO 1.9 12/06/2021   BILITOT 0.2 12/06/2021   ALKPHOS 41 (L) 12/06/2021   AST 26 12/06/2021   ALT 24 12/06/2021   Last lipids Lab Results  Component Value Date   CHOL 119 10/31/2021   HDL 50 10/31/2021   LDLCALC 55 10/31/2021   TRIG 67 10/31/2021   CHOLHDL 2.4 10/31/2021   Last hemoglobin A1c Lab Results  Component Value Date   HGBA1C 6.5 (H) 10/31/2021   Last thyroid  functions Lab Results  Component Value Date   TSH 1.200 02/04/2020    Objective     BP 131/67 (BP Location: Right Arm, Patient Position: Sitting, Cuff Size: Normal)   Pulse (!) 53   Temp 98 F (36.7 C) (Oral)   Resp 16   Ht $R'5\' 5"'XJ$  (1.651 m)   Wt 133 lb (60.3 kg)   SpO2 98%   BMI 22.13 kg/m   BP Readings from Last 3 Encounters:  05/01/22 131/67  03/09/22 121/73  10/31/21 (!) 125/47   Wt Readings from Last 3 Encounters:  05/01/22 133 lb (60.3 kg)  10/31/21 147 lb (66.7 kg)  04/30/21 145 lb (65.8 kg)   SpO2 Readings from Last 3 Encounters:  05/01/22 98%  03/09/22 97%  10/31/21 99%   Physical Exam Vitals and nursing note reviewed.  Constitutional:      General: She is awake. She is not in acute distress.    Appearance: Normal appearance. She is well-developed, well-groomed and normal weight. She is not ill-appearing, toxic-appearing or diaphoretic.  HENT:     Head: Normocephalic and atraumatic.     Jaw: There is normal jaw occlusion. No trismus, tenderness, swelling or pain on movement.     Right Ear: Hearing, tympanic membrane, ear canal and external ear normal. There is no impacted cerumen.     Left Ear: Hearing, tympanic membrane, ear canal and external ear normal. There is no impacted cerumen.     Nose: Nose normal. No congestion or rhinorrhea.     Right Turbinates: Not enlarged, swollen or pale.     Left Turbinates: Not enlarged, swollen or pale.     Right Sinus: No maxillary sinus tenderness or frontal sinus tenderness.     Left Sinus: No maxillary sinus tenderness or frontal sinus tenderness.     Mouth/Throat:     Lips:  Pink.     Mouth: Mucous membranes are moist. No injury.     Tongue: No lesions.     Pharynx: Oropharynx is clear. Uvula midline. No pharyngeal swelling, oropharyngeal exudate, posterior oropharyngeal erythema or uvula swelling.     Tonsils: No tonsillar exudate or tonsillar abscesses.  Eyes:     General: Lids are normal. Lids are everted, no  foreign bodies appreciated. Vision grossly intact. Gaze aligned appropriately. No allergic shiner or visual field deficit.       Right eye: No discharge.        Left eye: No discharge.     Extraocular Movements: Extraocular movements intact.     Conjunctiva/sclera: Conjunctivae normal.     Right eye: Right conjunctiva is not injected. No exudate.    Left eye: Left conjunctiva is not injected. No exudate.    Pupils: Pupils are equal, round, and reactive to light.  Neck:     Thyroid: No thyroid mass, thyromegaly or thyroid tenderness.     Vascular: No carotid bruit.     Trachea: Trachea normal.  Cardiovascular:     Rate and Rhythm: Regular rhythm. Bradycardia present.     Pulses: Normal pulses.          Carotid pulses are 2+ on the right side and 2+ on the left side.      Radial pulses are 2+ on the right side and 2+ on the left side.       Dorsalis pedis pulses are 2+ on the right side and 2+ on the left side.       Posterior tibial pulses are 2+ on the right side and 2+ on the left side.     Heart sounds: Normal heart sounds, S1 normal and S2 normal. No murmur heard.    No friction rub. No gallop.  Pulmonary:     Effort: Pulmonary effort is normal. No respiratory distress.     Breath sounds: Normal breath sounds and air entry. No stridor. No wheezing, rhonchi or rales.  Chest:     Chest wall: No tenderness.     Comments: Breasts: risk and benefit of breast self-exam was discussed, not examined, patient declines to have breast exam  Abdominal:     General: Abdomen is flat. Bowel sounds are normal. There is no distension.     Palpations: Abdomen is soft. There is no mass.     Tenderness: There is no abdominal tenderness. There is no right CVA tenderness, left CVA tenderness, guarding or rebound.     Hernia: No hernia is present.  Genitourinary:    Comments: Exam deferred; denies complaints Musculoskeletal:        General: No swelling, tenderness, deformity or signs of injury. Normal  range of motion.     Cervical back: Full passive range of motion without pain, normal range of motion and neck supple. No edema, rigidity or tenderness. No muscular tenderness.     Right lower leg: No edema.     Left lower leg: No edema.  Lymphadenopathy:     Cervical: No cervical adenopathy.     Right cervical: No superficial, deep or posterior cervical adenopathy.    Left cervical: No superficial, deep or posterior cervical adenopathy.  Skin:    General: Skin is warm and dry.     Capillary Refill: Capillary refill takes less than 2 seconds.     Coloration: Skin is not jaundiced or pale.     Findings: No bruising, erythema, lesion or rash.  Neurological:     General: No focal deficit present.     Mental Status: She is alert and oriented to person, place, and time. Mental status is at baseline.     GCS: GCS eye subscore is 4. GCS verbal subscore is 5. GCS motor subscore is 6.     Sensory: Sensation is intact. No sensory deficit.     Motor: Motor function is intact. No weakness.     Coordination: Coordination is intact. Coordination normal.     Gait: Gait is intact. Gait normal.  Psychiatric:        Attention and Perception: Attention and perception normal.        Mood and Affect: Mood and affect normal.        Speech: Speech normal.        Behavior: Behavior normal. Behavior is cooperative.        Thought Content: Thought content normal.        Cognition and Memory: Cognition and memory normal.        Judgment: Judgment normal.    Last depression screening scores    05/01/2022    9:48 AM 10/31/2021    9:18 AM 04/30/2021    1:31 PM  PHQ 2/9 Scores  PHQ - 2 Score 0 0 0  PHQ- 9 Score 1 0 0   Last fall risk screening    05/01/2022    9:48 AM  Fall Risk   Falls in the past year? 0  Number falls in past yr: 0  Injury with Fall? 0   Last Audit-C alcohol use screening    05/01/2022    9:48 AM  Alcohol Use Disorder Test (AUDIT)  1. How often do you have a drink containing alcohol?  0  2. How many drinks containing alcohol do you have on a typical day when you are drinking? 0  3. How often do you have six or more drinks on one occasion? 0  AUDIT-C Score 0   A score of 3 or more in women, and 4 or more in men indicates increased risk for alcohol abuse, EXCEPT if all of the points are from question 1   No results found for any visits on 05/01/22.  Assessment & Plan    Routine Health Maintenance and Physical Exam  Exercise Activities and Dietary recommendations  Goals      DIET - INCREASE WATER INTAKE     Recommend increasing water intake to 4-6 glasses a day.      HEMOGLOBIN A1C < 7.0        Immunization History  Administered Date(s) Administered   Influenza, High Dose Seasonal PF 07/14/2014, 06/12/2015, 06/28/2017   Influenza-Unspecified 07/06/2018, 07/24/2021   Pneumococcal Conjugate-13 12/07/2014   Pneumococcal Polysaccharide-23 09/26/2011   Tdap 02/10/2006   Zoster Recombinat (Shingrix) 09/11/2018, 12/08/2018    Health Maintenance  Topic Date Due   TETANUS/TDAP  02/11/2016   OPHTHALMOLOGY EXAM  12/04/2018   INFLUENZA VACCINE  04/23/2022   FOOT EXAM  04/30/2022   HEMOGLOBIN A1C  04/30/2022   COVID-19 Vaccine (1) 05/16/2022 (Originally 01/22/1947)   Pneumonia Vaccine 37+ Years old  Completed   DEXA SCAN  Completed   Hepatitis C Screening  Completed   Zoster Vaccines- Shingrix  Completed   HPV VACCINES  Aged Out   COLONOSCOPY (Pts 45-9yrs Insurance coverage will need to be confirmed)  Discontinued    Discussed health benefits of physical activity, and encouraged her to engage in regular exercise appropriate for her  age and condition.  Problem List Items Addressed This Visit       Cardiovascular and Mediastinum   Cardiomyopathy ([facility])    Chronic, stable Followed by cards- Dr Raliegh Ip Denies complaints       Relevant Medications   losartan (COZAAR) 100 MG tablet   lovastatin (MEVACOR) 40 MG tablet   Hypertension associated with diabetes (HCC)     Chronic, stable At goal Combo therapy of coreg 6.25 mg BID, Losartan 50 mg (1/2 100 mg tablet)      Relevant Medications   losartan (COZAAR) 100 MG tablet   lovastatin (MEVACOR) 40 MG tablet   metFORMIN (GLUCOPHAGE-XR) 750 MG 24 hr tablet   Other Relevant Orders   Comprehensive metabolic panel     Endocrine   Hyperlipidemia associated with type 2 diabetes mellitus (HCC)    Chronic, previously stable Review of LDL at 54 I recommend diet low in saturated fat and regular exercise - 30 min at least 5 times per week Currently controlled on Mevacor 40 mg      Relevant Medications   losartan (COZAAR) 100 MG tablet   lovastatin (MEVACOR) 40 MG tablet   metFORMIN (GLUCOPHAGE-XR) 750 MG 24 hr tablet   Other Relevant Orders   Lipid panel   Type 2 diabetes mellitus with stage 3b chronic kidney disease, with long-term current use of insulin (HCC)    Chronic, stable Repeat A1c Continue to recommend balanced, lower carb meals. Smaller meal size, adding snacks. Choosing water as drink of choice and increasing purposeful exercise.       Relevant Medications   losartan (COZAAR) 100 MG tablet   lovastatin (MEVACOR) 40 MG tablet   metFORMIN (GLUCOPHAGE-XR) 750 MG 24 hr tablet   Other Relevant Orders   Hemoglobin A1c   Comprehensive metabolic panel   Urine Microalbumin w/creat. ratio     Genitourinary   Stage 3b chronic kidney disease (HCC)    Chronic, stable Has not seen nephrology On Farxiga 10 mg Repeat CMP and urine micro       Relevant Orders   Hemoglobin A1c   Comprehensive metabolic panel   Urine Microalbumin w/creat. ratio     Other   Bradycardia    Acute, variable EKG completed SB on EKG; no comparison On BB- coreg 6.25 mg BID Denies concerns for dizziness or other side effects r/t bradycardia       Relevant Orders   EKG 12-Lead   Encounter for annual physical exam    Repeat labs today Denies additional concerns at this time       Encounter for screening  mammogram for malignant neoplasm of breast    Due for screening for mammogram, denies breast concerns, provided with phone number to call and schedule appointment for mammogram. Encouraged to repeat breast cancer screening every 1-2 years.       Encounter for subsequent annual wellness visit (AWV) in Medicare patient - Primary    UTD on vision UTD on dental Mammo is scheduled Things to do to keep yourself healthy  - Exercise at least 30-45 minutes a day, 3-4 days a week.  - Eat a low-fat diet with lots of fruits and vegetables, up to 7-9 servings per day.  - Seatbelts can save your life. Wear them always.  - Smoke detectors on every level of your home, check batteries every year.  - Eye Doctor - have an eye exam every 1-2 years  - Safe sex - if you may be exposed to STDs, use  a condom.  - Alcohol -  If you drink, do it moderately, less than 2 drinks per day.  - [place]. Choose someone to speak for you if you are not able.  - Depression is common in our stressful world.If you're feeling down or losing interest in things you normally enjoy, please come in for a visit.  - Violence - If anyone is threatening or hurting you, please call immediately.        Return in about 6 months (around 11/01/2022) for chonic disease management.    Vonna Kotyk, FNP, have reviewed all documentation for this visit. The documentation on 05/01/22 for the exam, diagnosis, procedures, and orders are all accurate and complete.  [name], [place] [phone] (phone) [phone] (fax)  [place]

## 2022-05-01 NOTE — Assessment & Plan Note (Signed)
UTD on vision UTD on dental Mammo is scheduled Things to do to keep yourself healthy  - Exercise at least 30-45 minutes a day, 3-4 days a week.  - Eat a low-fat diet with lots of fruits and vegetables, up to 7-9 servings per day.  - Seatbelts can save your life. Wear them always.  - Smoke detectors on every level of your home, check batteries every year.  - Eye Doctor - have an eye exam every 1-2 years  - Safe sex - if you may be exposed to STDs, use a condom.  - Alcohol -  If you drink, do it moderately, less than 2 drinks per day.  - [place]. Choose someone to speak for you if you are not able.  - Depression is common in our stressful world.If you're feeling down or losing interest in things you normally enjoy, please come in for a visit.  - Violence - If anyone is threatening or hurting you, please call immediately.

## 2022-05-01 NOTE — Assessment & Plan Note (Signed)
Due for screening for mammogram, denies breast concerns, provided with phone number to call and schedule appointment for mammogram. Encouraged to repeat breast cancer screening every 1-2 years.  

## 2022-05-01 NOTE — Assessment & Plan Note (Signed)
Repeat labs today Denies additional concerns at this time

## 2022-05-02 ENCOUNTER — Other Ambulatory Visit: Payer: Self-pay | Admitting: Family Medicine

## 2022-05-02 DIAGNOSIS — N1832 Chronic kidney disease, stage 3b: Secondary | ICD-10-CM

## 2022-05-02 LAB — COMPREHENSIVE METABOLIC PANEL
ALT: 17 IU/L (ref 0–32)
AST: 23 IU/L (ref 0–40)
Albumin/Globulin Ratio: 1.8 (ref 1.2–2.2)
Albumin: 4.4 g/dL (ref 3.8–4.8)
Alkaline Phosphatase: 43 IU/L — ABNORMAL LOW (ref 44–121)
BUN/Creatinine Ratio: 17 (ref 12–28)
BUN: 22 mg/dL (ref 8–27)
Bilirubin Total: 0.3 mg/dL (ref 0.0–1.2)
CO2: 24 mmol/L (ref 20–29)
Calcium: 10.6 mg/dL — ABNORMAL HIGH (ref 8.7–10.3)
Chloride: 102 mmol/L (ref 96–106)
Creatinine, Ser: 1.3 mg/dL — ABNORMAL HIGH (ref 0.57–1.00)
Globulin, Total: 2.4 g/dL (ref 1.5–4.5)
Glucose: 106 mg/dL — ABNORMAL HIGH (ref 70–99)
Potassium: 4.2 mmol/L (ref 3.5–5.2)
Sodium: 142 mmol/L (ref 134–144)
Total Protein: 6.8 g/dL (ref 6.0–8.5)
eGFR: 43 mL/min/{1.73_m2} — ABNORMAL LOW (ref >59)

## 2022-05-02 LAB — MICROALBUMIN / CREATININE URINE RATIO
Creatinine, Urine: 82.8 mg/dL
Microalb/Creat Ratio: <4 mg/g creat (ref 0–29)
Microalbumin, Urine: <3 ug/mL

## 2022-05-02 LAB — HEMOGLOBIN A1C
Est. average glucose Bld gHb Est-mCnc: 131 mg/dL
Hgb A1c MFr Bld: 6.2 % — ABNORMAL HIGH (ref 4.8–5.6)

## 2022-05-02 LAB — LIPID PANEL
Chol/HDL Ratio: 2.4 ratio (ref 0.0–4.4)
Cholesterol, Total: 116 mg/dL (ref 100–199)
HDL: 49 mg/dL (ref >39)
LDL Chol Calc (NIH): 54 mg/dL (ref 0–99)
Triglycerides: 59 mg/dL (ref 0–149)
VLDL Cholesterol Cal: 13 mg/dL (ref 5–40)

## 2022-05-02 NOTE — Telephone Encounter (Signed)
Requested medication (s) are due for refill today: yes  Requested medication (s) are on the active medication list: yes  Last refill:  01/28/22 #90 for 0 RF   Future visit scheduled: no, seen yesterday  Notes to clinic:  Failed protocol of labs within 12 months, CBC from 2021, creat increasing,  please assess.       Requested Prescriptions  Pending Prescriptions Disp Refills   metFORMIN (GLUCOPHAGE-XR) 750 MG 24 hr tablet [Pharmacy Med Name: METFORMIN HCL ER 750 MG TABLET] 90 tablet 0    Sig: TAKE 1 TABLET BY MOUTH EVERY DAY WITH BREAKFAST     Endocrinology:  Diabetes - Biguanides Failed - 05/02/2022  2:18 AM      Failed - Cr in normal range and within 360 days    Creatinine, Ser  Date Value Ref Range Status  05/01/2022 1.30 (H) 0.57 - 1.00 mg/dL Final         Failed - HBA1C is between 0 and 7.9 and within 180 days    Hgb A1c MFr Bld  Date Value Ref Range Status  05/01/2022 6.2 (H) 4.8 - 5.6 % Final    Comment:             Prediabetes: 5.7 - 6.4          Diabetes: >6.4          Glycemic control for adults with diabetes: <7.0          Failed - eGFR in normal range and within 360 days    GFR calc Af Amer  Date Value Ref Range Status  02/04/2020 50 (L) >59 mL/min/1.73 Final    Comment:    **[facility] currently reports eGFR in compliance with the current**   recommendations of the [employer]. [facility] will   update reporting as new guidelines are published from the NKF-ASN   Task force.    GFR calc non Af Amer  Date Value Ref Range Status  02/04/2020 44 (L) >59 mL/min/1.73 Final   eGFR  Date Value Ref Range Status  05/01/2022 43 (L) >59 mL/min/1.73 Final         Failed - B12 Level in normal range and within 720 days    No results found for: "VITAMINB12"       Failed - CBC within normal limits and completed in the last 12 months    WBC  Date Value Ref Range Status  02/04/2020 5.5 3.4 - 10.8 x10E3/uL Final   RBC  Date Value Ref Range Status   02/04/2020 3.91 3.77 - 5.28 x10E6/uL Final   Hemoglobin  Date Value Ref Range Status  02/04/2020 11.7 11.1 - 15.9 g/dL Final   Hematocrit  Date Value Ref Range Status  02/04/2020 35.4 34.0 - 46.6 % Final   MCHC  Date Value Ref Range Status  02/04/2020 33.1 31.5 - 35.7 g/dL Final   [facility]  Date Value Ref Range Status  02/04/2020 29.9 26.6 - 33.0 pg Final   MCV  Date Value Ref Range Status  02/04/2020 91 79 - 97 fL Final   No results found for: "PLTCOUNTKUC", "LABPLAT", "POCPLA" RDW  Date Value Ref Range Status  02/04/2020 12.1 11.7 - 15.4 % Final         Passed - Valid encounter within last 6 months    Recent Outpatient Visits           Yesterday Encounter for subsequent annual wellness visit (AWV) in Medicare patient   [employer]  [name], FNP   6 months ago Hypertension associated with diabetes [facility]   [facility] [name], FNP   1 year ago Encounter for annual wellness visit (AWV) in Medicare patient   [place], [place]   2 years ago Diabetes mellitus with no complication [facility])   [place], Clearnce Sorrel, [place]   3 years ago Annual physical exam   [facility] Mar Daring, [place]

## 2022-05-02 NOTE — Progress Notes (Signed)
Normal urine micro albumin.  [name], [address] [place], [place] [address] [phone] (phone) [phone] (fax) [place]

## 2022-05-02 NOTE — Progress Notes (Signed)
Improved A1c; at 6.2% Continue to recommend balanced, lower carb meals. Smaller meal size, adding snacks. Choosing water as drink of choice and increasing purposeful exercise.  Recommend follow up with nephrology given chronic kidney function decrease with elevated creatinine and slight decrease in eGFR.   Cholesterol at goal with LDL <60.  [name], [address] [place], [place] [address] [phone] (phone) [phone] (fax) [place]

## 2022-05-03 DIAGNOSIS — M9901 Segmental and somatic dysfunction of cervical region: Secondary | ICD-10-CM | POA: Diagnosis not present

## 2022-05-03 DIAGNOSIS — M9904 Segmental and somatic dysfunction of sacral region: Secondary | ICD-10-CM | POA: Diagnosis not present

## 2022-05-03 DIAGNOSIS — M9903 Segmental and somatic dysfunction of lumbar region: Secondary | ICD-10-CM | POA: Diagnosis not present

## 2022-05-03 DIAGNOSIS — M9902 Segmental and somatic dysfunction of thoracic region: Secondary | ICD-10-CM | POA: Diagnosis not present

## 2022-05-07 DIAGNOSIS — M9901 Segmental and somatic dysfunction of cervical region: Secondary | ICD-10-CM | POA: Diagnosis not present

## 2022-05-07 DIAGNOSIS — M9902 Segmental and somatic dysfunction of thoracic region: Secondary | ICD-10-CM | POA: Diagnosis not present

## 2022-05-07 DIAGNOSIS — M9904 Segmental and somatic dysfunction of sacral region: Secondary | ICD-10-CM | POA: Diagnosis not present

## 2022-05-07 DIAGNOSIS — M9903 Segmental and somatic dysfunction of lumbar region: Secondary | ICD-10-CM | POA: Diagnosis not present

## 2022-05-10 DIAGNOSIS — M9903 Segmental and somatic dysfunction of lumbar region: Secondary | ICD-10-CM | POA: Diagnosis not present

## 2022-05-10 DIAGNOSIS — M9901 Segmental and somatic dysfunction of cervical region: Secondary | ICD-10-CM | POA: Diagnosis not present

## 2022-05-10 DIAGNOSIS — M9902 Segmental and somatic dysfunction of thoracic region: Secondary | ICD-10-CM | POA: Diagnosis not present

## 2022-05-10 DIAGNOSIS — M9904 Segmental and somatic dysfunction of sacral region: Secondary | ICD-10-CM | POA: Diagnosis not present

## 2022-05-13 ENCOUNTER — Telehealth: Payer: Self-pay

## 2022-05-13 NOTE — Telephone Encounter (Signed)
Pt. Called to review lab work again. Has not heard from nephrology referral yet.

## 2022-05-13 NOTE — Telephone Encounter (Signed)
Spoke with patient, she stated her understanding of her labwork and said she will reach out to [facility].

## 2022-05-14 DIAGNOSIS — M9901 Segmental and somatic dysfunction of cervical region: Secondary | ICD-10-CM | POA: Diagnosis not present

## 2022-05-14 DIAGNOSIS — M9904 Segmental and somatic dysfunction of sacral region: Secondary | ICD-10-CM | POA: Diagnosis not present

## 2022-05-14 DIAGNOSIS — M9902 Segmental and somatic dysfunction of thoracic region: Secondary | ICD-10-CM | POA: Diagnosis not present

## 2022-05-14 DIAGNOSIS — M9903 Segmental and somatic dysfunction of lumbar region: Secondary | ICD-10-CM | POA: Diagnosis not present

## 2022-05-17 DIAGNOSIS — M9902 Segmental and somatic dysfunction of thoracic region: Secondary | ICD-10-CM | POA: Diagnosis not present

## 2022-05-17 DIAGNOSIS — M9901 Segmental and somatic dysfunction of cervical region: Secondary | ICD-10-CM | POA: Diagnosis not present

## 2022-05-17 DIAGNOSIS — M9903 Segmental and somatic dysfunction of lumbar region: Secondary | ICD-10-CM | POA: Diagnosis not present

## 2022-05-17 DIAGNOSIS — M9904 Segmental and somatic dysfunction of sacral region: Secondary | ICD-10-CM | POA: Diagnosis not present

## 2022-05-21 DIAGNOSIS — M9904 Segmental and somatic dysfunction of sacral region: Secondary | ICD-10-CM | POA: Diagnosis not present

## 2022-05-21 DIAGNOSIS — M9901 Segmental and somatic dysfunction of cervical region: Secondary | ICD-10-CM | POA: Diagnosis not present

## 2022-05-21 DIAGNOSIS — M9903 Segmental and somatic dysfunction of lumbar region: Secondary | ICD-10-CM | POA: Diagnosis not present

## 2022-05-21 DIAGNOSIS — M9902 Segmental and somatic dysfunction of thoracic region: Secondary | ICD-10-CM | POA: Diagnosis not present

## 2022-05-22 ENCOUNTER — Ambulatory Visit
Admission: RE | Admit: 2022-05-22 | Discharge: 2022-05-22 | Disposition: A | Discharge status: Home / Self Care | Payer: PPO | Source: Ambulatory Visit | Attending: Family Medicine | Admitting: Family Medicine

## 2022-05-22 DIAGNOSIS — Z1231 Encounter for screening mammogram for malignant neoplasm of breast: Secondary | ICD-10-CM | POA: Diagnosis not present

## 2022-05-23 DIAGNOSIS — H43813 Vitreous degeneration, bilateral: Secondary | ICD-10-CM | POA: Diagnosis not present

## 2022-05-23 LAB — HM DIABETES EYE EXAM

## 2022-05-23 NOTE — Progress Notes (Signed)
Hi Bennett  Normal mammogram; repeat in 1 year.  Please let us know if you have any questions.  Thank you,  Rakan Soffer, FNP 

## 2022-05-24 DIAGNOSIS — M9904 Segmental and somatic dysfunction of sacral region: Secondary | ICD-10-CM | POA: Diagnosis not present

## 2022-05-24 DIAGNOSIS — M9901 Segmental and somatic dysfunction of cervical region: Secondary | ICD-10-CM | POA: Diagnosis not present

## 2022-05-24 DIAGNOSIS — M9903 Segmental and somatic dysfunction of lumbar region: Secondary | ICD-10-CM | POA: Diagnosis not present

## 2022-05-24 DIAGNOSIS — M9902 Segmental and somatic dysfunction of thoracic region: Secondary | ICD-10-CM | POA: Diagnosis not present

## 2022-05-28 DIAGNOSIS — M9901 Segmental and somatic dysfunction of cervical region: Secondary | ICD-10-CM | POA: Diagnosis not present

## 2022-05-28 DIAGNOSIS — M9902 Segmental and somatic dysfunction of thoracic region: Secondary | ICD-10-CM | POA: Diagnosis not present

## 2022-05-28 DIAGNOSIS — M9903 Segmental and somatic dysfunction of lumbar region: Secondary | ICD-10-CM | POA: Diagnosis not present

## 2022-05-28 DIAGNOSIS — M9904 Segmental and somatic dysfunction of sacral region: Secondary | ICD-10-CM | POA: Diagnosis not present

## 2022-06-19 DIAGNOSIS — E1122 Type 2 diabetes mellitus with diabetic chronic kidney disease: Secondary | ICD-10-CM | POA: Diagnosis not present

## 2022-06-19 DIAGNOSIS — I10 Essential (primary) hypertension: Secondary | ICD-10-CM | POA: Diagnosis not present

## 2022-06-19 DIAGNOSIS — I509 Heart failure, unspecified: Secondary | ICD-10-CM | POA: Diagnosis not present

## 2022-06-19 DIAGNOSIS — E785 Hyperlipidemia, unspecified: Secondary | ICD-10-CM | POA: Diagnosis not present

## 2022-06-19 DIAGNOSIS — N1832 Chronic kidney disease, stage 3b: Secondary | ICD-10-CM | POA: Diagnosis not present

## 2022-06-19 DIAGNOSIS — G4730 Sleep apnea, unspecified: Secondary | ICD-10-CM | POA: Diagnosis not present

## 2022-06-19 DIAGNOSIS — R8290 Unspecified abnormal findings in urine: Secondary | ICD-10-CM | POA: Diagnosis not present

## 2022-06-21 ENCOUNTER — Other Ambulatory Visit: Payer: Self-pay | Admitting: Nephrology

## 2022-06-21 DIAGNOSIS — M9904 Segmental and somatic dysfunction of sacral region: Secondary | ICD-10-CM | POA: Diagnosis not present

## 2022-06-21 DIAGNOSIS — M9901 Segmental and somatic dysfunction of cervical region: Secondary | ICD-10-CM | POA: Diagnosis not present

## 2022-06-21 DIAGNOSIS — E1122 Type 2 diabetes mellitus with diabetic chronic kidney disease: Secondary | ICD-10-CM

## 2022-06-21 DIAGNOSIS — M9902 Segmental and somatic dysfunction of thoracic region: Secondary | ICD-10-CM | POA: Diagnosis not present

## 2022-06-21 DIAGNOSIS — N1832 Chronic kidney disease, stage 3b: Secondary | ICD-10-CM

## 2022-06-21 DIAGNOSIS — R8290 Unspecified abnormal findings in urine: Secondary | ICD-10-CM

## 2022-06-21 DIAGNOSIS — M9903 Segmental and somatic dysfunction of lumbar region: Secondary | ICD-10-CM | POA: Diagnosis not present

## 2022-06-28 ENCOUNTER — Ambulatory Visit
Admission: RE | Admit: 2022-06-28 | Discharge: 2022-06-28 | Disposition: A | Discharge status: Home / Self Care | Payer: PPO | Source: Ambulatory Visit | Attending: Nephrology | Admitting: Nephrology

## 2022-06-28 DIAGNOSIS — N1832 Chronic kidney disease, stage 3b: Secondary | ICD-10-CM | POA: Insufficient documentation

## 2022-06-28 DIAGNOSIS — R8290 Unspecified abnormal findings in urine: Secondary | ICD-10-CM | POA: Insufficient documentation

## 2022-06-28 DIAGNOSIS — N189 Chronic kidney disease, unspecified: Secondary | ICD-10-CM | POA: Diagnosis not present

## 2022-06-28 DIAGNOSIS — E1122 Type 2 diabetes mellitus with diabetic chronic kidney disease: Secondary | ICD-10-CM | POA: Diagnosis not present

## 2022-06-28 DIAGNOSIS — N281 Cyst of kidney, acquired: Secondary | ICD-10-CM | POA: Diagnosis not present

## 2022-07-02 DIAGNOSIS — M9903 Segmental and somatic dysfunction of lumbar region: Secondary | ICD-10-CM | POA: Diagnosis not present

## 2022-07-02 DIAGNOSIS — M9904 Segmental and somatic dysfunction of sacral region: Secondary | ICD-10-CM | POA: Diagnosis not present

## 2022-07-02 DIAGNOSIS — M9902 Segmental and somatic dysfunction of thoracic region: Secondary | ICD-10-CM | POA: Diagnosis not present

## 2022-07-02 DIAGNOSIS — M9901 Segmental and somatic dysfunction of cervical region: Secondary | ICD-10-CM | POA: Diagnosis not present

## 2022-07-16 DIAGNOSIS — M9902 Segmental and somatic dysfunction of thoracic region: Secondary | ICD-10-CM | POA: Diagnosis not present

## 2022-07-16 DIAGNOSIS — M9904 Segmental and somatic dysfunction of sacral region: Secondary | ICD-10-CM | POA: Diagnosis not present

## 2022-07-16 DIAGNOSIS — M9903 Segmental and somatic dysfunction of lumbar region: Secondary | ICD-10-CM | POA: Diagnosis not present

## 2022-07-16 DIAGNOSIS — M9901 Segmental and somatic dysfunction of cervical region: Secondary | ICD-10-CM | POA: Diagnosis not present

## 2022-07-30 DIAGNOSIS — M9904 Segmental and somatic dysfunction of sacral region: Secondary | ICD-10-CM | POA: Diagnosis not present

## 2022-07-30 DIAGNOSIS — M9901 Segmental and somatic dysfunction of cervical region: Secondary | ICD-10-CM | POA: Diagnosis not present

## 2022-07-30 DIAGNOSIS — M9902 Segmental and somatic dysfunction of thoracic region: Secondary | ICD-10-CM | POA: Diagnosis not present

## 2022-07-30 DIAGNOSIS — M9903 Segmental and somatic dysfunction of lumbar region: Secondary | ICD-10-CM | POA: Diagnosis not present

## 2022-07-31 ENCOUNTER — Other Ambulatory Visit: Payer: Self-pay | Admitting: Family Medicine

## 2022-07-31 NOTE — Telephone Encounter (Signed)
Requested medication (s) are due for refill today: yes  Requested medication (s) are on the active medication list: yes  Last refill:  05/02/22 #90 0 refills  Future visit scheduled: yes in 9 months  Notes to clinic:  no refills remain, do you want to refill Rx?     Requested Prescriptions  Pending Prescriptions Disp Refills   metFORMIN (GLUCOPHAGE-XR) 750 MG 24 hr tablet [Pharmacy Med Name: METFORMIN HCL ER 750 MG TABLET] 90 tablet 0    Sig: TAKE 1 TABLET BY MOUTH EVERY DAY WITH BREAKFAST     Endocrinology:  Diabetes - Biguanides Failed - 07/31/2022  8:30 AM      Failed - Cr in normal range and within 360 days    Creatinine, Ser  Date Value Ref Range Status  05/01/2022 1.30 (H) 0.57 - 1.00 mg/dL Final         Failed - eGFR in normal range and within 360 days    GFR calc Af Amer  Date Value Ref Range Status  02/04/2020 50 (L) >59 mL/min/1.73 Final    Comment:    **[facility] currently reports eGFR in compliance with the current**   recommendations of the National Kidney Foundation. [facility] will   update reporting as new guidelines are published from the NKF-ASN   Task force.    GFR calc non Af Amer  Date Value Ref Range Status  02/04/2020 44 (L) >59 mL/min/1.73 Final   eGFR  Date Value Ref Range Status  05/01/2022 43 (L) >59 mL/min/1.73 Final         Failed - B12 Level in normal range and within 720 days    No results found for: "VITAMINB12"       Failed - CBC within normal limits and completed in the last 12 months    WBC  Date Value Ref Range Status  02/04/2020 5.5 3.4 - 10.8 x10E3/uL Final   RBC  Date Value Ref Range Status  02/04/2020 3.91 3.77 - 5.28 x10E6/uL Final   Hemoglobin  Date Value Ref Range Status  02/04/2020 11.7 11.1 - 15.9 g/dL Final   Hematocrit  Date Value Ref Range Status  02/04/2020 35.4 34.0 - 46.6 % Final   MCHC  Date Value Ref Range Status  02/04/2020 33.1 31.5 - 35.7 g/dL Final   MCH  Date Value Ref Range Status  02/04/2020  29.9 26.6 - 33.0 pg Final   MCV  Date Value Ref Range Status  02/04/2020 91 79 - 97 fL Final   No results found for: "PLTCOUNTKUC", "LABPLAT", "POCPLA" RDW  Date Value Ref Range Status  02/04/2020 12.1 11.7 - 15.4 % Final         Passed - HBA1C is between 0 and 7.9 and within 180 days    Hgb A1c MFr Bld  Date Value Ref Range Status  05/01/2022 6.2 (H) 4.8 - 5.6 % Final    Comment:             Prediabetes: 5.7 - 6.4          Diabetes: >6.4          Glycemic control for adults with diabetes: <7.0          Passed - Valid encounter within last 6 months    Recent Outpatient Visits           3 months ago Encounter for subsequent annual wellness visit (AWV) in Medicare patient   [facility] Payne, Elise T, FNP   9   months ago Hypertension associated with diabetes (HCC)   [facility] Payne, Elise T, FNP   1 year ago Encounter for annual wellness visit (AWV) in Medicare patient   [facility] Bacigalupo, Angela M, MD   2 years ago Diabetes mellitus with no complication (HCC)   [facility] Burnette, Jennifer M, PA-C   3 years ago Annual physical exam   [facility] Burnette, Jennifer M, PA-C       Future Appointments             In 9 months Payne, Elise T, FNP [facility]             

## 2022-08-06 ENCOUNTER — Ambulatory Visit: Payer: PPO | Admitting: Dermatology

## 2022-08-06 DIAGNOSIS — D2271 Melanocytic nevi of right lower limb, including hip: Secondary | ICD-10-CM | POA: Diagnosis not present

## 2022-08-06 DIAGNOSIS — L570 Actinic keratosis: Secondary | ICD-10-CM

## 2022-08-06 DIAGNOSIS — D229 Melanocytic nevi, unspecified: Secondary | ICD-10-CM

## 2022-08-06 DIAGNOSIS — R238 Other skin changes: Secondary | ICD-10-CM | POA: Diagnosis not present

## 2022-08-06 DIAGNOSIS — L820 Inflamed seborrheic keratosis: Secondary | ICD-10-CM

## 2022-08-06 DIAGNOSIS — L720 Epidermal cyst: Secondary | ICD-10-CM | POA: Diagnosis not present

## 2022-08-06 DIAGNOSIS — Z86018 Personal history of other benign neoplasm: Secondary | ICD-10-CM

## 2022-08-06 DIAGNOSIS — Z1283 Encounter for screening for malignant neoplasm of skin: Secondary | ICD-10-CM

## 2022-08-06 DIAGNOSIS — L814 Other melanin hyperpigmentation: Secondary | ICD-10-CM | POA: Diagnosis not present

## 2022-08-06 DIAGNOSIS — L578 Other skin changes due to chronic exposure to nonionizing radiation: Secondary | ICD-10-CM | POA: Diagnosis not present

## 2022-08-06 DIAGNOSIS — L821 Other seborrheic keratosis: Secondary | ICD-10-CM | POA: Diagnosis not present

## 2022-08-06 MED ORDER — TRETINOIN 0.1 % EX CREA: TOPICAL_CREAM | CUTANEOUS | 6 refills | Status: DC

## 2022-08-06 NOTE — Patient Instructions (Addendum)
In Jan 2024, start treatment to the right lower cheek. Apply fluorouracil 5% cream and calcipotriene cream twice daily x 1 week.  Reviewed course of treatment and expected reaction.  Patient advised to expect inflammation and crusting and advised that erosions are possible.  Patient advised to be diligent with sun protection during and after treatment. Counseled to keep medication out of reach of children and pets.   Cryotherapy Aftercare  Wash gently with soap and water everyday.   Apply Vaseline and Band-Aid daily until healed.   Due to recent changes in healthcare laws, you may see results of your pathology and/or laboratory studies on MyChart before the doctors have had a chance to review them. We understand that in some cases there may be results that are confusing or concerning to you. Please understand that not all results are received at the same time and often the doctors may need to interpret multiple results in order to provide you with the best plan of care or course of treatment. Therefore, we ask that you please give [place] 2 business days to thoroughly review all your results before contacting the office for clarification. Should we see a critical lab result, you will be contacted sooner.   If You Need Anything After Your Visit  If you have any questions or concerns for your doctor, please call our main line at [phone] and press option 4 to reach your doctor's medical assistant. If no one answers, please leave a voicemail as directed and we will return your call as soon as possible. Messages left after 4 pm will be answered the following business day.   You may also send [place] a message via [place]. We typically respond to MyChart messages within 1-2 business days.  For prescription refills, please ask your pharmacy to contact our office. Our fax number is [phone].  If you have an urgent issue when the clinic is closed that cannot wait until the next business day, you can page  your doctor at the number below.    Please note that while we do our best to be available for urgent issues outside of office hours, we are not available 24/7.   If you have an urgent issue and are unable to reach [place], you may choose to seek medical care at your doctor's office, retail clinic, urgent care center, or emergency room.  If you have a medical emergency, please immediately call 911 or go to the emergency department.  Pager Numbers  - Dr. Nehemiah Massed: [phone]  - Dr. Laurence Ferrari: [phone]  - Dr. Nicole Kindred: [phone]  In the event of inclement weather, please call our main line at [phone] for an update on the status of any delays or closures.  Dermatology Medication Tips: Please keep the boxes that topical medications come in in order to help keep track of the instructions about where and how to use these. Pharmacies typically print the medication instructions only on the boxes and not directly on the medication tubes.   If your medication is too expensive, please contact our office at [phone] option 4 or send [place] a message through [place].   We are unable to tell what your co-pay for medications will be in advance as this is different depending on your insurance coverage. However, we may be able to find a substitute medication at lower cost or fill out paperwork to get insurance to cover a needed medication.   If a prior authorization is required to get your medication covered by your insurance  company, please allow [place] 1-2 business days to complete this process.  Drug prices often vary depending on where the prescription is filled and some pharmacies may offer cheaper prices.  The website www.goodrx.com contains coupons for medications through different pharmacies. The prices here do not account for what the cost may be with help from insurance (it may be cheaper with your insurance), but the website can give you the price if you did not use any insurance.  - You can  print the associated coupon and take it with your prescription to the pharmacy.  - You may also stop by our office during regular business hours and pick up a GoodRx coupon card.  - If you need your prescription sent electronically to a different pharmacy, notify our office through [facility] or by phone at [phone] option 4.     Si Usted Necesita Algo Despus de Su Visita  Tambin puede enviarnos un mensaje a travs de Pharmacist, community. Por lo general respondemos a los mensajes de MyChart en el transcurso de 1 a 2 das hbiles.  Para renovar recetas, por favor pida a su farmacia que se ponga en contacto con nuestra oficina. Harland Dingwall de fax es [place] [phone].  Si tiene un asunto urgente cuando la clnica est cerrada y que no puede esperar hasta el siguiente da hbil, puede llamar/localizar a su doctor(a) al nmero que aparece a continuacin.   Por favor, tenga en cuenta que aunque hacemos todo lo posible para estar disponibles para asuntos urgentes fuera del horario de [place], no estamos disponibles las 24 horas del da, los 7 das de la [place].   Si tiene un problema urgente y no puede comunicarse con nosotros, puede optar por buscar atencin mdica  en el consultorio de su doctor(a), en una clnica privada, en un centro de atencin urgente o en una sala de emergencias.  Si tiene Engineering geologist, por favor llame inmediatamente al 911 o vaya a la sala de emergencias.  Nmeros de bper  - Dr. Nehemiah Massed: [phone]  - Dra. Moye: [phone]  - Dra. Nicole Kindred: [phone]  En caso de inclemencias del [place], por favor llame a Johnsie Kindred principal al [phone] para una actualizacin sobre el [place] de cualquier retraso o cierre.  Consejos para la medicacin en dermatologa: Por favor, guarde las cajas en las que vienen los medicamentos de uso tpico para ayudarle a seguir las instrucciones sobre dnde y cmo usarlos. Las farmacias generalmente imprimen las  instrucciones del medicamento slo en las cajas y no directamente en los tubos del [place].   Si su medicamento es muy caro, por favor, pngase en contacto con Zigmund Daniel llamando al [phone] y presione la opcin 4 o envenos un mensaje a travs de Pharmacist, community.   No podemos decirle cul ser su copago por los medicamentos por adelantado ya que esto es diferente dependiendo de la cobertura de su seguro. Sin embargo, es posible que podamos encontrar un medicamento sustituto a Electrical engineer un formulario para que el seguro cubra el medicamento que se considera necesario.   Si se requiere una autorizacin previa para que su compaa de seguros [place] su medicamento, por favor permtanos de 1 a 2 das hbiles para completar este proceso.  Los precios de los medicamentos varan con frecuencia dependiendo del Environmental consultant de dnde se surte la receta y alguna farmacias pueden ofrecer precios ms baratos.  El sitio web www.goodrx.com tiene cupones para medicamentos de Airline pilot. Los precios aqu no tienen en cuenta  lo que podra costar con la ayuda del seguro (puede ser ms barato con su seguro), pero el sitio web puede darle el precio si no Field seismologist.  - Puede imprimir el cupn correspondiente y llevarlo con su receta a la farmacia.  - Tambin puede pasar por nuestra oficina durante el horario de atencin regular y Charity fundraiser una tarjeta de cupones de GoodRx.  - Si necesita que su receta se enve electrnicamente a una farmacia diferente, informe a nuestra oficina a travs de MyChart de  o por telfono llamando al [phone] y presione la opcin 4.

## 2022-08-06 NOTE — Progress Notes (Signed)
Follow-Up Visit   Subjective  Dawn Roberts is a 76 y.o. female who presents for the following: Annual Exam.  The patient presents for Total-Body Skin Exam (TBSE) for skin cancer screening and mole check.  The patient has spots, moles and lesions to be evaluated, some may be new or changing. She has a spot on her upper back that was treated in the past, but has come back. She has an irritated spot of the left axilla. 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 She has an irritated bump near the right eye she would like removed.   The following portions of the chart were reviewed this encounter and updated as appropriate:       Review of Systems:  No other skin or systemic complaints except as noted in HPI or Assessment and Plan.  Objective  Well appearing patient in no apparent distress; mood and affect are within normal limits.  A full examination was performed including scalp, head, eyes, ears, nose, lips, neck, chest, axillae, abdomen, back, buttocks, bilateral upper extremities, bilateral lower extremities, hands, feet, fingers, toes, fingernails, and toenails. All findings within normal limits unless otherwise noted below.  Right Upper Back x 1; Left Axilla x 1 (2) Erythematous stuck-on, waxy papule  right anterior thigh 2.5 mm med dark brown macule    Right Lower Vermilion Lip Soft blue papule.  right mid upper eyelid at margin; face R mid upper eyelid at margin/lash line, 1.42m firm white papule. Smooth white papules of the face.  right lower cheek Pink scaly macule.    Assessment & Plan  Skin cancer screening performed today.  Actinic Damage - chronic, secondary to cumulative UV radiation exposure/sun exposure over time - diffuse scaly erythematous macules with underlying dyspigmentation - Recommend daily broad spectrum sunscreen SPF 30+ to sun-exposed areas, reapply every 2 hours as needed.  - Recommend staying in the shade or wearing long sleeves,  sun glasses (UVA+UVB protection) and wide brim hats (4-inch brim around the entire circumference of the hat). - Call for new or changing lesions. Lentigines - Scattered tan macules - Due to sun exposure - Benign-appearing, observe - Recommend daily broad spectrum sunscreen SPF 30+ to sun-exposed areas, reapply every 2 hours as needed. - Call for any changes  Seborrheic Keratoses - Stuck-on, waxy, tan-brown papules and/or plaques  - Benign-appearing - Discussed benign etiology and prognosis. - Observe - Call for any changes  Melanocytic Nevi - Tan-brown and/or pink-flesh-colored symmetric macules and papules - Benign appearing on exam today - Observation - Call clinic for new or changing moles - Recommend daily use of broad spectrum spf 30+ sunscreen to sun-exposed areas.   History of Dysplastic Nevus - No evidence of recurrence today of the right upper back - Recommend regular full body skin exams - Recommend daily broad spectrum sunscreen SPF 30+ to sun-exposed areas, reapply every 2 hours as needed.  - Call if any new or changing lesions are noted between office visits  Xerosis - diffuse xerotic patches of the face, secondary to retinoid - recommend gentle, hydrating skin care - gentle skin care handout given  Inflamed seborrheic keratosis (2) Right Upper Back x 1; Left Axilla x 1  Symptomatic, irritating, patient would like treated.  Destruction of lesion - Right Upper Back x 1; Left Axilla x 1  Destruction method: cryotherapy   Informed consent: discussed and consent obtained   Lesion destroyed using liquid nitrogen: Yes   Region frozen until ice ball extended  beyond lesion: Yes   Outcome: patient tolerated procedure well with no complications   Post-procedure details: wound care instructions given   Additional details:  Prior to procedure, discussed risks of blister formation, small wound, skin dyspigmentation, or rare scar following cryotherapy. Recommend Vaseline  ointment to treated areas while healing.   Nevus right anterior thigh  Benign-appearing, stable.  Observation.  Call clinic for new or changing moles.  Recommend daily use of broad spectrum spf 30+ sunscreen to sun-exposed areas.   Venous lake Right Lower Vermilion Lip  Benign, observe.    Milia right mid upper eyelid at margin; face  Milia surgery to right upper eyelid at margin, Symptomatic, irritating, patient would like treated. .  Continue tretinoin 0.1% cream Apply QHS face as tolerated dsp 45g 6Rf Topical retinoid medications like tretinoin/Retin-A, adapalene/Differin, tazarotene/Fabior, and Epiduo/Epiduo Forte can cause dryness and irritation when first started. Only apply a pea-sized amount to the entire affected area. Avoid applying it around the eyes, edges of mouth and creases at the nose. If you experience irritation, use a good moisturizer first and/or apply the medicine less often. If you are doing well with the medicine, you can increase how often you use it until you are applying every night. Be careful with sun protection while using this medication as it can make you sensitive to the sun. This medicine should not be used by pregnant women.    Acne/Milia surgery - right mid upper eyelid at margin; face Procedure risks and benefits were discussed with the patient and verbal consent was obtained. Following prep of the skin on the right mid upper eyelid at margin with an alcohol swab and local anesthesia injection, extraction of milia was performed with cotton-tipped applicators following superficial incision made over their surfaces with a #11 blade. Vaseline ointment was applied to each site. The patient tolerated the procedure well.  AK (actinic keratosis) right lower cheek  Recurrent, txd with cryotherapy and 5FU/Calcipotriene cream in past  Once healed, Patient will retreat with 5FU/Calcipotriene cream in 2 months to right cheek BID x 1 week. Patient has at home.  RTC if doesn't clear. Reviewed course of treatment and expected reaction.  Patient advised to expect inflammation and crusting and advised that erosions are possible.  Patient advised to be diligent with sun protection during and after treatment. Counseled to keep medication out of reach of children and pets.   Actinic keratoses are precancerous spots that appear secondary to cumulative UV radiation exposure/sun exposure over time. They are chronic with expected duration over 1 year. A portion of actinic keratoses will progress to squamous cell carcinoma of the skin. It is not possible to reliably predict which spots will progress to skin cancer and so treatment is recommended to prevent development of skin cancer.  Recommend daily broad spectrum sunscreen SPF 30+ to sun-exposed areas, reapply every 2 hours as needed.  Recommend staying in the shade or wearing long sleeves, sun glasses (UVA+UVB protection) and wide brim hats (4-inch brim around the entire circumference of the hat). Call for new or changing lesions.  Destruction of lesion - right lower cheek  Destruction method: cryotherapy   Informed consent: discussed and consent obtained   Lesion destroyed using liquid nitrogen: Yes   Region frozen until ice ball extended beyond lesion: Yes   Outcome: patient tolerated procedure well with no complications   Post-procedure details: wound care instructions given   Additional details:  Prior to procedure, discussed risks of blister formation, small wound, skin  dyspigmentation, or rare scar following cryotherapy. Recommend Vaseline ointment to treated areas while healing.    Return in about 1 year (around 08/07/2023) for TBSE, AKs.  IJamesetta Orleans, CMA, am acting as scribe for [name], MD .  Documentation: I have reviewed the above documentation for accuracy and completeness, and I agree with the above.  [name] MD

## 2022-08-13 DIAGNOSIS — M9903 Segmental and somatic dysfunction of lumbar region: Secondary | ICD-10-CM | POA: Diagnosis not present

## 2022-08-13 DIAGNOSIS — M9904 Segmental and somatic dysfunction of sacral region: Secondary | ICD-10-CM | POA: Diagnosis not present

## 2022-08-13 DIAGNOSIS — M9901 Segmental and somatic dysfunction of cervical region: Secondary | ICD-10-CM | POA: Diagnosis not present

## 2022-08-13 DIAGNOSIS — M9902 Segmental and somatic dysfunction of thoracic region: Secondary | ICD-10-CM | POA: Diagnosis not present

## 2022-08-14 DIAGNOSIS — E785 Hyperlipidemia, unspecified: Secondary | ICD-10-CM | POA: Diagnosis not present

## 2022-08-14 DIAGNOSIS — I509 Heart failure, unspecified: Secondary | ICD-10-CM | POA: Diagnosis not present

## 2022-08-14 DIAGNOSIS — R8290 Unspecified abnormal findings in urine: Secondary | ICD-10-CM | POA: Diagnosis not present

## 2022-08-14 DIAGNOSIS — N1832 Chronic kidney disease, stage 3b: Secondary | ICD-10-CM | POA: Diagnosis not present

## 2022-08-14 DIAGNOSIS — I10 Essential (primary) hypertension: Secondary | ICD-10-CM | POA: Diagnosis not present

## 2022-08-14 DIAGNOSIS — E1122 Type 2 diabetes mellitus with diabetic chronic kidney disease: Secondary | ICD-10-CM | POA: Diagnosis not present

## 2022-08-14 DIAGNOSIS — G4730 Sleep apnea, unspecified: Secondary | ICD-10-CM | POA: Diagnosis not present

## 2022-08-21 DIAGNOSIS — I255 Ischemic cardiomyopathy: Secondary | ICD-10-CM | POA: Diagnosis not present

## 2022-08-21 DIAGNOSIS — I509 Heart failure, unspecified: Secondary | ICD-10-CM | POA: Diagnosis not present

## 2022-08-21 DIAGNOSIS — E1122 Type 2 diabetes mellitus with diabetic chronic kidney disease: Secondary | ICD-10-CM | POA: Diagnosis not present

## 2022-08-21 DIAGNOSIS — I10 Essential (primary) hypertension: Secondary | ICD-10-CM | POA: Diagnosis not present

## 2022-08-21 DIAGNOSIS — N1832 Chronic kidney disease, stage 3b: Secondary | ICD-10-CM | POA: Diagnosis not present

## 2022-08-30 ENCOUNTER — Ambulatory Visit
Admission: EM | Admit: 2022-08-30 | Discharge: 2022-08-30 | Disposition: A | Discharge status: Home / Self Care | Payer: PPO | Attending: Nurse Practitioner | Admitting: Nurse Practitioner

## 2022-08-30 DIAGNOSIS — Z1152 Encounter for screening for COVID-19: Secondary | ICD-10-CM | POA: Insufficient documentation

## 2022-08-30 DIAGNOSIS — J069 Acute upper respiratory infection, unspecified: Secondary | ICD-10-CM | POA: Diagnosis not present

## 2022-08-30 LAB — RESP PANEL BY RT-PCR (FLU A&B, COVID) ARPGX2
Influenza A by PCR: NEGATIVE
Influenza B by PCR: NEGATIVE
SARS Coronavirus 2 by RT PCR: NEGATIVE

## 2022-08-30 MED ORDER — FLUTICASONE PROPIONATE 50 MCG/ACT NA SUSP: 1.0000 | Freq: Every day | NASAL | 0 refills | Status: DC

## 2022-08-30 MED ORDER — BENZONATATE 100 MG PO CAPS: 100.0000 mg | ORAL_CAPSULE | Freq: Three times a day (TID) | ORAL | 0 refills | Status: DC | PRN

## 2022-08-30 NOTE — Discharge Instructions (Addendum)
The cough is most likely coming from a viral upper respiratory infection.  Symptoms should improve over the next week to 10 days.  If you develop chest pain or shortness of breath, go to the emergency room.  We have tested you today for COVID-19 and influenza.  You will see the results in Mychart and we will call you with positive results.    Please stay home and isolate until you are aware of the results.    Some things that can make you feel better are: - Increased rest - Increasing fluid with water/sugar free electrolytes - Acetaminophen and ibuprofen as needed for fever/pain - Salt water gargling, chloraseptic spray and throat lozenges - OTC guaifenesin (Mucinex) 600 mg twice daily for congestion - Flonase nasal spray to help with the fluid in your ears and post nasal drainage - Saline sinus flushes or a neti pot - Humidifying the air -Tessalon Perles during the day as needed for dry cough

## 2022-08-30 NOTE — ED Triage Notes (Signed)
Pt reports cough, nasal congestion, chest congestion x 3 days. NyQuil gives no relief.

## 2022-08-30 NOTE — ED Provider Notes (Signed)
Dawn Roberts    CSN: [id number] Arrival date & time: 08/30/22  1106      History   Chief Complaint Chief Complaint  Patient presents with   Cough    HPI [name] is a 76 y.o. female.   Patient presents today for 3 days of congested cough.  Also endorses sneezing, bilateral ear pressure, bilateral watery eyes without redness or itching.  She denies fever, body aches or chills.  No shortness of breath, chest pain or tightness, chest or nasal congestion.  Reports at first she had a sore throat, however this is better now.  No headache, sinus pressure, abdominal pain, nausea/vomiting, diarrhea, decreased appetite, loss of taste or smell, new rash, or fatigue.  Has taken DayQuil for symptoms with minimal improvement.  Has not perform COVID-19 testing at home so far.  Denies testing positive for COVID-19 so far.  Has not had any COVID-19 vaccines.  Medical history significant for chronic systolic heart failure, cardiomyopathy, hypertension with diabetes, hyperlipidemia, and stage IIIb chronic kidney disease.    Past Medical History:  Diagnosis Date   Allergy    Anemia    Arthritis    Cardiomegaly    CHF (congestive heart failure) ([place])    Dysplastic nevus 07/16/2019   Right upper back. Mild atypia, close to margin.   Hyperlipidemia    Hypertension    Sleep apnea    MILD    Patient Active Problem List   Diagnosis Date Noted   Encounter for subsequent annual wellness visit (AWV) in Medicare patient 05/01/2022   Encounter for annual physical exam 05/01/2022   Encounter for screening mammogram for malignant neoplasm of breast 05/01/2022   Type 2 diabetes mellitus with stage 3b chronic kidney disease, with long-term current use of insulin ([name]) 11/01/2021   Stage 3b chronic kidney disease ([name]) 11/01/2021   Diabetes mellitus ([name]) 10/31/2021   Chronic bilateral low back pain without sciatica 10/31/2021   Genetic testing 08/17/2017   T2DM (type 2 diabetes  mellitus) ([name]) 06/12/2015   Bradycardia 03/07/2015   Allergic rhinitis 59/56/3875   Chronic systolic heart failure ([name]) 09/06/2014   Cardiomyopathy ([name]) 05/02/2014   Hypertension associated with diabetes ([name]) 05/02/2014   Apnea, sleep 05/02/2014   Hyperlipidemia associated with type 2 diabetes mellitus ([name]) 05/02/2014   Cannot sleep 06/12/2007    Past Surgical History:  Procedure Laterality Date   BREAST EXCISIONAL BIOPSY Right 1976   BREAST MASS EXCISION Right 1976   benign   CATARACT EXTRACTION W/PHACO Left 07/29/2017   Procedure: CATARACT EXTRACTION PHACO AND INTRAOCULAR LENS PLACEMENT (IOC)-LEFT;  Surgeon: Birder Robson, MD;  Location: [facility] ORS;  Service: Ophthalmology;  Laterality: Left;  [place] 00:49 AP% 15.6 CDE 7.78 Fluid pack lot # [id number]   CATARACT EXTRACTION W/PHACO Right 09/02/2017   Procedure: CATARACT EXTRACTION PHACO AND INTRAOCULAR LENS PLACEMENT (IOC);  Surgeon: Birder Robson, MD;  Location: [facility] ORS;  Service: Ophthalmology;  Laterality: Right;  [place] 00:58.7 AP% 18.0 CDE 10.58 Fluid Pack lot # [id number]   COLONOSCOPY  03-29-05   Dr [name]   COLONOSCOPY N/A 02/01/2015   Procedure: COLONOSCOPY;  Surgeon: [name], MD;  Location: [facility] ENDOSCOPY;  Service: Endoscopy;  Laterality: N/A;   venous closure procedure Right 07-25-05   Dr. [name]    OB History     Gravida  4   Para  4   Term      Preterm      AB      Living  SAB      IAB      Ectopic      Multiple      Live Births           Obstetric Comments  1st Menstrual Cycle:  13  1st Pregnancy:  20          Home Medications    Prior to Admission medications   Medication Sig Start Date End Date Taking? Authorizing Provider  benzonatate (TESSALON) 100 MG capsule Take 1 capsule (100 mg total) by mouth 3 (three) times daily as needed for cough. Do not take with alcohol or while driving or operating heavy machinery.  May cause drowsiness. 08/30/22  Yes [name], NP  fluticasone (FLONASE) 50 MCG/ACT nasal spray Place 1 spray into both nostrils daily. 08/30/22  Yes [name], NP  Pseudoeph-Doxylamine-DM-APAP (NYQUIL PO) Take by mouth.   Yes [provider]  aspirin EC 81 MG tablet Take 81 mg by mouth daily with supper.    [provider]  Bioflavonoid Products (VITAMIN C) CHEW Chew 1 tablet by mouth 3 (three) times a week.    [provider]  Calcium Carb-Cholecalciferol (CALCIUM + D3 PO) Take 1 tablet by mouth 2 (two) times daily.    [provider]  carvedilol (COREG) 6.25 MG tablet Take 6.25 mg by mouth 2 (two) times daily. 01/02/15   [provider]  dapagliflozin propanediol (FARXIGA) 10 MG TABS tablet Take 1 tablet (10 mg total) by mouth daily before breakfast. 12/14/21   [name], FNP  FARXIGA 5 MG TABS tablet Take 5 mg by mouth every morning.    [provider]  losartan (COZAAR) 100 MG tablet Take 0.5 tablets by mouth daily. 12/25/21   [provider]  losartan (COZAAR) 50 MG tablet Take 50 mg by mouth daily.  01/13/15   [provider]  lovastatin (MEVACOR) 40 MG tablet Take 40 mg by mouth at bedtime.  05/11/19 04/30/21  [provider]  lovastatin (MEVACOR) 40 MG tablet TAKE 1 TABLET BY MOUTH EVERY DAY WITH DINNER 02/05/22   [provider]  metFORMIN (GLUCOPHAGE-XR) 750 MG 24 hr tablet Take 1 tablet by mouth daily with breakfast. 01/28/22   [provider]  metFORMIN (GLUCOPHAGE-XR) 750 MG 24 hr tablet TAKE 1 TABLET BY MOUTH EVERY DAY WITH BREAKFAST 07/31/22   [name], FNP  Misc Natural Products (OSTEO BI-FLEX ADV JOINT SHIELD PO) Take 1 tablet by mouth 2 (two) times daily.     [provider]  Multiple Vitamin (MULTIVITAMIN WITH MINERALS) TABS tablet Take 1 tablet by mouth daily with lunch.    [provider]  predniSONE (DELTASONE) 20 MG tablet Take 2 tablets (40 mg total) by mouth daily. 03/09/22   [name], MD  spironolactone (ALDACTONE) 25 MG tablet Take 25 mg by mouth daily. 12/19/14   [provider]  tretinoin (RETIN-A) 0.1 % cream APPLY 1 APPLICATION TO AFFECTED AREAS ON FACE AND CHEST AT BEDTIME EVERY OTHER NIGHT. 08/06/22   [name], MD  vitamin B-12 (CYANOCOBALAMIN) 1000 MCG tablet Take 1,000 mcg by mouth daily.    [provider]    Family History Family History  Problem Relation Age of Onset   Diabetes Father    Ovarian cancer Sister    Breast cancer Sister 76   Healthy Sister    Healthy Brother    Healthy Sister    Breast cancer Maternal Aunt  Social History Social History   Tobacco Use   Smoking status: Never   Smokeless tobacco: Never  Vaping Use   Vaping Use: Never used  Substance Use Topics   Alcohol use: No    Alcohol/week: 0.0 standard drinks of alcohol   Drug use: No     Allergies   Empagliflozin   Review of Systems Review of Systems Per HPI  Physical Exam Triage Vital Signs ED Triage Vitals  Enc Vitals Group     BP 08/30/22 1225 115/63     Pulse Rate 08/30/22 1225 60     Resp 08/30/22 1225 18     Temp 08/30/22 1225 98.2 F (36.8 C)     Temp Source 08/30/22 1225 Oral     SpO2 08/30/22 1225 96 %     Weight --      Height --      Head Circumference --      Peak Flow --      Pain Score 08/30/22 1224 0     Pain Loc --      Pain Edu? --      Excl. in [place]? --    No data found.  Updated Vital Signs BP 115/63 (BP Location: Right Arm)   Pulse 60   Temp 98.2 F (36.8 C) (Oral)   Resp 18   SpO2 96%   Visual Acuity Right Eye Distance:   Left Eye Distance:   Bilateral Distance:    Right Eye Near:   Left Eye Near:    Bilateral Near:     Physical Exam Vitals and nursing note reviewed.  Constitutional:      General: She is not in acute distress.    Appearance: Normal appearance. She is not ill-appearing or toxic-appearing.  HENT:     Head: Normocephalic and atraumatic.     Right Ear: Ear canal and  external ear normal. A middle ear effusion is present.     Left Ear: Ear canal and external ear normal. A middle ear effusion is present.     Nose: No congestion or rhinorrhea.     Mouth/Throat:     Mouth: Mucous membranes are moist.     Pharynx: Oropharynx is clear. Posterior oropharyngeal erythema present. No oropharyngeal exudate.  Eyes:     General: No scleral icterus.    Extraocular Movements: Extraocular movements intact.  Cardiovascular:     Rate and Rhythm: Normal rate and regular rhythm.  Pulmonary:     Effort: Pulmonary effort is normal. No respiratory distress.     Breath sounds: Normal breath sounds. No wheezing, rhonchi or rales.  Abdominal:     General: Abdomen is flat. Bowel sounds are normal. There is no distension.     Palpations: Abdomen is soft.     Tenderness: There is no abdominal tenderness.  Musculoskeletal:     Cervical back: Normal range of motion and neck supple.  Lymphadenopathy:     Cervical: Cervical adenopathy present.  Skin:    General: Skin is warm and dry.     Coloration: Skin is not jaundiced or pale.     Findings: No erythema or rash.  Neurological:     Mental Status: She is alert and oriented to person, place, and time.  Psychiatric:        Behavior: Behavior is cooperative.      UC Treatments / Results  Labs (all labs ordered are listed, but only abnormal results are displayed) Labs Reviewed  RESP PANEL BY RT-PCR (  FLU A&B, COVID) [id number]    EKG   Radiology No results found.  Procedures Procedures (including critical Roberts time)  Medications Ordered in UC Medications - No data to display  Initial Impression / Assessment and Plan / UC Course  I have reviewed the triage vital signs and the nursing notes.  Pertinent labs & imaging results that were available during my Roberts of the patient were reviewed by me and considered in my medical decision making (see chart for details).   Patient is well-appearing, normotensive, afebrile,  not tachycardic, not tachypneic, oxygenating well on room air.    Encounter for screening for COVID-19 Viral URI with cough Suspect viral etiology; other differentials include new onset allergic rhinitis given right middle ear effusion COVID-19, influenza testing obtained Supportive Roberts discussed-recommended use of Flonase, start Tessalon Perles as needed for dry cough ER and return precautions discussed with patient  The patient was given the opportunity to ask questions.  All questions answered to their satisfaction.  The patient is in agreement to this plan.    Final Clinical Impressions(s) / UC Diagnoses   Final diagnoses:  Encounter for screening for COVID-19  Viral URI with cough     Discharge Instructions      The cough is most likely coming from a viral upper respiratory infection.  Symptoms should improve over the next week to 10 days.  If you develop chest pain or shortness of breath, go to the emergency room.  We have tested you today for COVID-19 and influenza.  You will see the results in Mychart and we will call you with positive results.    Please stay home and isolate until you are aware of the results.    Some things that can make you feel better are: - Increased rest - Increasing fluid with water/sugar free electrolytes - Acetaminophen and ibuprofen as needed for fever/pain - Salt water gargling, chloraseptic spray and throat lozenges - OTC guaifenesin (Mucinex) 600 mg twice daily for congestion - Flonase nasal spray to help with the fluid in your ears and post nasal drainage - Saline sinus flushes or a neti pot - Humidifying the air -Tessalon Perles during the day as needed for dry cough     ED Prescriptions     Medication Sig Dispense Auth. Provider   fluticasone (FLONASE) 50 MCG/ACT nasal spray Place 1 spray into both nostrils daily. 16 g [name], NP   benzonatate (TESSALON) 100 MG capsule Take 1 capsule (100 mg total) by mouth 3 (three)  times daily as needed for cough. Do not take with alcohol or while driving or operating heavy machinery.  May cause drowsiness. 21 capsule [name], NP      PDMP not reviewed this encounter.   [name], NP 08/30/22 1331

## 2022-09-10 DIAGNOSIS — M9904 Segmental and somatic dysfunction of sacral region: Secondary | ICD-10-CM | POA: Diagnosis not present

## 2022-09-10 DIAGNOSIS — M9902 Segmental and somatic dysfunction of thoracic region: Secondary | ICD-10-CM | POA: Diagnosis not present

## 2022-09-10 DIAGNOSIS — M9901 Segmental and somatic dysfunction of cervical region: Secondary | ICD-10-CM | POA: Diagnosis not present

## 2022-09-10 DIAGNOSIS — M9903 Segmental and somatic dysfunction of lumbar region: Secondary | ICD-10-CM | POA: Diagnosis not present

## 2022-09-24 DIAGNOSIS — M9901 Segmental and somatic dysfunction of cervical region: Secondary | ICD-10-CM | POA: Diagnosis not present

## 2022-09-24 DIAGNOSIS — M9904 Segmental and somatic dysfunction of sacral region: Secondary | ICD-10-CM | POA: Diagnosis not present

## 2022-09-24 DIAGNOSIS — M9902 Segmental and somatic dysfunction of thoracic region: Secondary | ICD-10-CM | POA: Diagnosis not present

## 2022-09-24 DIAGNOSIS — M9903 Segmental and somatic dysfunction of lumbar region: Secondary | ICD-10-CM | POA: Diagnosis not present

## 2022-10-15 DIAGNOSIS — M9904 Segmental and somatic dysfunction of sacral region: Secondary | ICD-10-CM | POA: Diagnosis not present

## 2022-10-15 DIAGNOSIS — M9901 Segmental and somatic dysfunction of cervical region: Secondary | ICD-10-CM | POA: Diagnosis not present

## 2022-10-15 DIAGNOSIS — M9903 Segmental and somatic dysfunction of lumbar region: Secondary | ICD-10-CM | POA: Diagnosis not present

## 2022-10-15 DIAGNOSIS — M9902 Segmental and somatic dysfunction of thoracic region: Secondary | ICD-10-CM | POA: Diagnosis not present

## 2022-10-29 DIAGNOSIS — M9901 Segmental and somatic dysfunction of cervical region: Secondary | ICD-10-CM | POA: Diagnosis not present

## 2022-10-29 DIAGNOSIS — M9902 Segmental and somatic dysfunction of thoracic region: Secondary | ICD-10-CM | POA: Diagnosis not present

## 2022-10-29 DIAGNOSIS — M9904 Segmental and somatic dysfunction of sacral region: Secondary | ICD-10-CM | POA: Diagnosis not present

## 2022-10-29 DIAGNOSIS — M9903 Segmental and somatic dysfunction of lumbar region: Secondary | ICD-10-CM | POA: Diagnosis not present

## 2022-10-30 DIAGNOSIS — I10 Essential (primary) hypertension: Secondary | ICD-10-CM | POA: Diagnosis not present

## 2022-10-30 DIAGNOSIS — I5022 Chronic systolic (congestive) heart failure: Secondary | ICD-10-CM | POA: Diagnosis not present

## 2022-10-30 DIAGNOSIS — E782 Mixed hyperlipidemia: Secondary | ICD-10-CM | POA: Diagnosis not present

## 2022-10-30 DIAGNOSIS — I420 Dilated cardiomyopathy: Secondary | ICD-10-CM | POA: Diagnosis not present

## 2022-10-31 ENCOUNTER — Other Ambulatory Visit: Payer: Self-pay | Admitting: Family Medicine

## 2022-10-31 NOTE — Telephone Encounter (Signed)
Requested Prescriptions  Pending Prescriptions Disp Refills   metFORMIN (GLUCOPHAGE-XR) 750 MG 24 hr tablet [Pharmacy Med Name: METFORMIN HCL ER 750 MG TABLET] 90 tablet 0    Sig: TAKE 1 TABLET BY MOUTH EVERY DAY WITH BREAKFAST     Endocrinology:  Diabetes - Biguanides Failed - 10/31/2022  1:34 AM      Failed - Cr in normal range and within 360 days    Creatinine, Ser  Date Value Ref Range Status  05/01/2022 1.30 (H) 0.57 - 1.00 mg/dL Final         Failed - HBA1C is between 0 and 7.9 and within 180 days    Hgb A1c MFr Bld  Date Value Ref Range Status  05/01/2022 6.2 (H) 4.8 - 5.6 % Final    Comment:             Prediabetes: 5.7 - 6.4          Diabetes: >6.4          Glycemic control for adults with diabetes: <7.0          Failed - eGFR in normal range and within 360 days    GFR calc Af Amer  Date Value Ref Range Status  02/04/2020 50 (L) >59 mL/min/1.73 Final    Comment:    **[facility] currently reports eGFR in compliance with the current**   recommendations of the [employer]. [facility] will   update reporting as new guidelines are published from the NKF-ASN   Task force.    GFR calc non Af Amer  Date Value Ref Range Status  02/04/2020 44 (L) >59 mL/min/1.73 Final   eGFR  Date Value Ref Range Status  05/01/2022 43 (L) >59 mL/min/1.73 Final         Failed - B12 Level in normal range and within 720 days    No results found for: "VITAMINB12"       Failed - Valid encounter within last 6 months    Recent Outpatient Visits           6 months ago Encounter for subsequent annual wellness visit (AWV) in Medicare patient   [place] Tally Joe T, FNP   1 year ago Hypertension associated with diabetes [facility] [place])   [place] [name], FNP   1 year ago Encounter for annual wellness visit (AWV) in Medicare patient   [place], Dionne Bucy, MD   2 years ago Diabetes  mellitus with no complication [facility])   [place], Clearnce Sorrel, [place]   3 years ago Annual physical exam   [facility] [place], Clearnce Sorrel, [place]       Future Appointments             In 6 months [name], [place], [facility]            Failed - CBC within normal limits and completed in the last 12 months    WBC  Date Value Ref Range Status  02/04/2020 5.5 3.4 - 10.8 x10E3/uL Final   RBC  Date Value Ref Range Status  02/04/2020 3.91 3.77 - 5.28 x10E6/uL Final   Hemoglobin  Date Value Ref Range Status  02/04/2020 11.7 11.1 - 15.9 g/dL Final   Hematocrit  Date Value Ref Range Status  02/04/2020 35.4 34.0 - 46.6 % Final   MCHC  Date Value Ref  Range Status  02/04/2020 33.1 31.5 - 35.7 g/dL Final   [facility]  Date Value Ref Range Status  02/04/2020 29.9 26.6 - 33.0 pg Final   MCV  Date Value Ref Range Status  02/04/2020 91 79 - 97 fL Final   No results found for: "PLTCOUNTKUC", "LABPLAT", "POCPLA" RDW  Date Value Ref Range Status  02/04/2020 12.1 11.7 - 15.4 % Final

## 2022-11-19 DIAGNOSIS — I255 Ischemic cardiomyopathy: Secondary | ICD-10-CM | POA: Diagnosis not present

## 2022-11-19 DIAGNOSIS — I509 Heart failure, unspecified: Secondary | ICD-10-CM | POA: Diagnosis not present

## 2022-11-19 DIAGNOSIS — I10 Essential (primary) hypertension: Secondary | ICD-10-CM | POA: Diagnosis not present

## 2022-11-19 DIAGNOSIS — N1832 Chronic kidney disease, stage 3b: Secondary | ICD-10-CM | POA: Diagnosis not present

## 2022-11-19 DIAGNOSIS — E1122 Type 2 diabetes mellitus with diabetic chronic kidney disease: Secondary | ICD-10-CM | POA: Diagnosis not present

## 2022-11-20 DIAGNOSIS — I5022 Chronic systolic (congestive) heart failure: Secondary | ICD-10-CM | POA: Diagnosis not present

## 2022-11-20 DIAGNOSIS — I420 Dilated cardiomyopathy: Secondary | ICD-10-CM | POA: Diagnosis not present

## 2022-11-26 DIAGNOSIS — I255 Ischemic cardiomyopathy: Secondary | ICD-10-CM | POA: Diagnosis not present

## 2022-11-26 DIAGNOSIS — I10 Essential (primary) hypertension: Secondary | ICD-10-CM | POA: Diagnosis not present

## 2022-11-26 DIAGNOSIS — M9903 Segmental and somatic dysfunction of lumbar region: Secondary | ICD-10-CM | POA: Diagnosis not present

## 2022-11-26 DIAGNOSIS — N1832 Chronic kidney disease, stage 3b: Secondary | ICD-10-CM | POA: Diagnosis not present

## 2022-11-26 DIAGNOSIS — E1122 Type 2 diabetes mellitus with diabetic chronic kidney disease: Secondary | ICD-10-CM | POA: Diagnosis not present

## 2022-11-26 DIAGNOSIS — M9904 Segmental and somatic dysfunction of sacral region: Secondary | ICD-10-CM | POA: Diagnosis not present

## 2022-11-26 DIAGNOSIS — I509 Heart failure, unspecified: Secondary | ICD-10-CM | POA: Diagnosis not present

## 2022-11-26 DIAGNOSIS — M9902 Segmental and somatic dysfunction of thoracic region: Secondary | ICD-10-CM | POA: Diagnosis not present

## 2022-11-26 DIAGNOSIS — M9901 Segmental and somatic dysfunction of cervical region: Secondary | ICD-10-CM | POA: Diagnosis not present

## 2022-12-24 DIAGNOSIS — M9902 Segmental and somatic dysfunction of thoracic region: Secondary | ICD-10-CM | POA: Diagnosis not present

## 2022-12-24 DIAGNOSIS — M9904 Segmental and somatic dysfunction of sacral region: Secondary | ICD-10-CM | POA: Diagnosis not present

## 2022-12-24 DIAGNOSIS — M9901 Segmental and somatic dysfunction of cervical region: Secondary | ICD-10-CM | POA: Diagnosis not present

## 2022-12-24 DIAGNOSIS — M9903 Segmental and somatic dysfunction of lumbar region: Secondary | ICD-10-CM | POA: Diagnosis not present

## 2023-01-28 DIAGNOSIS — M9903 Segmental and somatic dysfunction of lumbar region: Secondary | ICD-10-CM | POA: Diagnosis not present

## 2023-01-28 DIAGNOSIS — M9901 Segmental and somatic dysfunction of cervical region: Secondary | ICD-10-CM | POA: Diagnosis not present

## 2023-01-28 DIAGNOSIS — M9904 Segmental and somatic dysfunction of sacral region: Secondary | ICD-10-CM | POA: Diagnosis not present

## 2023-01-28 DIAGNOSIS — M9902 Segmental and somatic dysfunction of thoracic region: Secondary | ICD-10-CM | POA: Diagnosis not present

## 2023-02-25 DIAGNOSIS — M9904 Segmental and somatic dysfunction of sacral region: Secondary | ICD-10-CM | POA: Diagnosis not present

## 2023-02-25 DIAGNOSIS — M9902 Segmental and somatic dysfunction of thoracic region: Secondary | ICD-10-CM | POA: Diagnosis not present

## 2023-02-25 DIAGNOSIS — M9903 Segmental and somatic dysfunction of lumbar region: Secondary | ICD-10-CM | POA: Diagnosis not present

## 2023-02-25 DIAGNOSIS — M9901 Segmental and somatic dysfunction of cervical region: Secondary | ICD-10-CM | POA: Diagnosis not present

## 2023-03-04 ENCOUNTER — Ambulatory Visit (INDEPENDENT_AMBULATORY_CARE_PROVIDER_SITE_OTHER): Payer: PPO

## 2023-03-04 VITALS — Ht 65.0 in | Wt 135.0 lb

## 2023-03-04 DIAGNOSIS — Z0000 Encounter for general adult medical examination without abnormal findings: Secondary | ICD-10-CM

## 2023-03-04 NOTE — Patient Instructions (Signed)
[name] , Thank you for taking time to come for your Medicare Wellness Visit. I appreciate your ongoing commitment to your health goals. Please review the following plan we discussed and let me know if I can assist you in the future.   These are the goals we discussed:  Goals      DIET - INCREASE WATER INTAKE     Recommend increasing water intake to 4-6 glasses a day.      HEMOGLOBIN A1C < 7.0        This is a list of the screening recommended for you and due dates:  Health Maintenance  Topic Date Due   COVID-19 Vaccine (1) Never done   DTaP/Tdap/Td vaccine (2 - Td or Tdap) 02/11/2016   Complete foot exam   04/30/2022   Hemoglobin A1C  11/01/2022   Flu Shot  04/24/2023   Yearly kidney function blood test for diabetes  05/02/2023   Yearly kidney health urinalysis for diabetes  05/02/2023   Eye exam for diabetics  05/24/2023   Medicare Annual Wellness Visit  03/03/2024   Pneumonia Vaccine  Completed   DEXA scan (bone density measurement)  Completed   Hepatitis C Screening  Completed   Zoster (Shingles) Vaccine  Completed   HPV Vaccine  Aged Out   Colon Cancer Screening  Discontinued    Advanced directives: no  Conditions/risks identified: low falls risk  Next appointment: Follow up in one year for your annual wellness visit 03/08/2024 @ 2:30pm telephone   Preventive Care 65 Years and Older, Female Preventive care refers to lifestyle choices and visits with your health care provider that can promote health and wellness. What does preventive care include? A yearly physical exam. This is also called an annual well check. Dental exams once or twice a year. Routine eye exams. Ask your health care provider how often you should have your eyes checked. Personal lifestyle choices, including: Daily care of your teeth and gums. Regular physical activity. Eating a healthy diet. Avoiding tobacco and drug use. Limiting alcohol use. Practicing safe sex. Taking low-dose aspirin  every day. Taking vitamin and mineral supplements as recommended by your health care provider. What happens during an annual well check? The services and screenings done by your health care provider during your annual well check will depend on your age, overall health, lifestyle risk factors, and family history of disease. Counseling  Your health care provider may ask you questions about your: Alcohol use. Tobacco use. Drug use. Emotional well-being. Home and relationship well-being. Sexual activity. Eating habits. History of falls. Memory and ability to understand (cognition). Work and work Astronomer. Reproductive health. Screening  You may have the following tests or measurements: Height, weight, and BMI. Blood pressure. Lipid and cholesterol levels. These may be checked every 5 years, or more frequently if you are over 6 years old. Skin check. Lung cancer screening. You may have this screening every year starting at age 19 if you have a 30-pack-year history of smoking and currently smoke or have quit within the past 15 years. Fecal occult blood test (FOBT) of the stool. You may have this test every year starting at age 15. Flexible sigmoidoscopy or colonoscopy. You may have a sigmoidoscopy every 5 years or a colonoscopy every 10 years starting at age 81. Hepatitis C blood test. Hepatitis B blood test. Sexually transmitted disease (STD) testing. Diabetes screening. This is done by checking your blood sugar (glucose) after you have not eaten for a while (fasting). You  may have this done every 1-3 years. Bone density scan. This is done to screen for osteoporosis. You may have this done starting at age 32. Mammogram. This may be done every 1-2 years. Talk to your health care provider about how often you should have regular mammograms. Talk with your health care provider about your test results, treatment options, and if necessary, the need for more tests. Vaccines  Your health  care provider may recommend certain vaccines, such as: Influenza vaccine. This is recommended every year. Tetanus, diphtheria, and acellular pertussis (Tdap, Td) vaccine. You may need a Td booster every 10 years. Zoster vaccine. You may need this after age 66. Pneumococcal 13-valent conjugate (PCV13) vaccine. One dose is recommended after age 48. Pneumococcal polysaccharide (PPSV23) vaccine. One dose is recommended after age 49. Talk to your health care provider about which screenings and vaccines you need and how often you need them. This information is not intended to replace advice given to you by your health care provider. Make sure you discuss any questions you have with your health care provider. Document Released: 10/06/2015 Document Revised: 05/29/2016 Document Reviewed: 07/11/2015 Elsevier Interactive Patient Education  2017 [employer].  Fall Prevention in the Home Falls can cause injuries. They can happen to people of all ages. There are many things you can do to make your home safe and to help prevent falls. What can I do on the outside of my home? Regularly fix the edges of walkways and driveways and fix any cracks. Remove anything that might make you trip as you walk through a door, such as a raised step or threshold. Trim any bushes or trees on the path to your home. Use bright outdoor lighting. Clear any walking paths of anything that might make someone trip, such as rocks or tools. Regularly check to see if handrails are loose or broken. Make sure that both sides of any steps have handrails. Any raised decks and porches should have guardrails on the edges. Have any leaves, snow, or ice cleared regularly. Use sand or salt on walking paths during winter. Clean up any spills in your garage right away. This includes oil or grease spills. What can I do in the bathroom? Use night lights. Install grab bars by the toilet and in the tub and shower. Do not use towel bars as grab  bars. Use non-skid mats or decals in the tub or shower. If you need to sit down in the shower, use a plastic, non-slip stool. Keep the floor dry. Clean up any water that spills on the floor as soon as it happens. Remove soap buildup in the tub or shower regularly. Attach bath mats securely with double-sided non-slip rug tape. Do not have throw rugs and other things on the floor that can make you trip. What can I do in the bedroom? Use night lights. Make sure that you have a light by your bed that is easy to reach. Do not use any sheets or blankets that are too big for your bed. They should not hang down onto the floor. Have a firm chair that has side arms. You can use this for support while you get dressed. Do not have throw rugs and other things on the floor that can make you trip. What can I do in the kitchen? Clean up any spills right away. Avoid walking on wet floors. Keep items that you use a lot in easy-to-reach places. If you need to reach something above you, use a strong  step stool that has a grab bar. Keep electrical cords out of the way. Do not use floor polish or wax that makes floors slippery. If you must use wax, use non-skid floor wax. Do not have throw rugs and other things on the floor that can make you trip. What can I do with my stairs? Do not leave any items on the stairs. Make sure that there are handrails on both sides of the stairs and use them. Fix handrails that are broken or loose. Make sure that handrails are as long as the stairways. Check any carpeting to make sure that it is firmly attached to the stairs. Fix any carpet that is loose or worn. Avoid having throw rugs at the top or bottom of the stairs. If you do have throw rugs, attach them to the floor with carpet tape. Make sure that you have a light switch at the top of the stairs and the bottom of the stairs. If you do not have them, ask someone to add them for you. What else can I do to help prevent  falls? Wear shoes that: Do not have high heels. Have rubber bottoms. Are comfortable and fit you well. Are closed at the toe. Do not wear sandals. If you use a stepladder: Make sure that it is fully opened. Do not climb a closed stepladder. Make sure that both sides of the stepladder are locked into place. Ask someone to hold it for you, if possible. Clearly mark and make sure that you can see: Any grab bars or handrails. First and last steps. Where the edge of each step is. Use tools that help you move around (mobility aids) if they are needed. These include: Canes. Walkers. Scooters. Crutches. Turn on the lights when you go into a dark area. Replace any light bulbs as soon as they burn out. Set up your furniture so you have a clear path. Avoid moving your furniture around. If any of your floors are uneven, fix them. If there are any pets around you, be aware of where they are. Review your medicines with your doctor. Some medicines can make you feel dizzy. This can increase your chance of falling. Ask your doctor what other things that you can do to help prevent falls. This information is not intended to replace advice given to you by your health care provider. Make sure you discuss any questions you have with your health care provider. Document Released: 07/06/2009 Document Revised: 02/15/2016 Document Reviewed: 10/14/2014 Elsevier Interactive Patient Education  2017 [employer].

## 2023-03-04 NOTE — Progress Notes (Signed)
I connected with  Dawn Roberts on 03/04/23 by a audio enabled telemedicine application and verified that I am speaking with the correct person using two identifiers.  Patient Location: Home  Provider Location: Office/Clinic  I discussed the limitations of evaluation and management by telemedicine. The patient expressed understanding and agreed to proceed.  Subjective:   Dawn Roberts is a 77 y.o. female who presents for Medicare Annual (Subsequent) preventive examination.  Review of Systems    Cardiac Risk Factors include: advanced age (>82men, >56 women);diabetes mellitus;dyslipidemia;hypertension    Objective:    Today's Vitals   03/04/23 1423  Weight: 135 lb (61.2 kg)  Height: 5\' 5"  (1.651 m)   Body mass index is 22.47 kg/m.     03/04/2023    2:33 PM 02/03/2020    8:49 AM 02/02/2019    8:45 AM 01/29/2018    8:40 AM 09/02/2017    9:16 AM 08/19/2017    3:20 PM 01/28/2017   10:02 AM  Advanced Directives  Does Patient Have a Medical Advance Directive? No No No No No No No  Would patient like information on creating a medical advance directive?  No - Patient declined No - Patient declined No - Patient declined No - Patient declined      Current Medications (verified) Outpatient Encounter Medications as of 03/04/2023  Medication Sig   aspirin EC 81 MG tablet Take 81 mg by mouth daily with supper.   carvedilol (COREG) 6.25 MG tablet Take 6.25 mg by mouth 2 (two) times daily.   dapagliflozin propanediol (FARXIGA) 10 MG TABS tablet Take 1 tablet (10 mg total) by mouth daily before breakfast.   FARXIGA 5 MG TABS tablet Take 5 mg by mouth every morning.   losartan (COZAAR) 100 MG tablet Take 0.5 tablets by mouth daily.   losartan (COZAAR) 50 MG tablet Take 50 mg by mouth daily.    lovastatin (MEVACOR) 40 MG tablet TAKE 1 TABLET BY MOUTH EVERY DAY WITH DINNER   metFORMIN (GLUCOPHAGE-XR) 750 MG 24 hr tablet Take 1 tablet by mouth daily with breakfast.   metFORMIN (GLUCOPHAGE-XR)  750 MG 24 hr tablet TAKE 1 TABLET BY MOUTH EVERY DAY WITH BREAKFAST   Misc Natural Products (OSTEO BI-FLEX ADV JOINT SHIELD PO) Take 1 tablet by mouth 2 (two) times daily.    spironolactone (ALDACTONE) 25 MG tablet Take 25 mg by mouth daily.   benzonatate (TESSALON) 100 MG capsule Take 1 capsule (100 mg total) by mouth 3 (three) times daily as needed for cough. Do not take with alcohol or while driving or operating heavy machinery.  May cause drowsiness. (Patient not taking: Reported on 03/04/2023)   Bioflavonoid Products (VITAMIN C) CHEW Chew 1 tablet by mouth 3 (three) times a week. (Patient not taking: Reported on 03/04/2023)   Calcium Carb-Cholecalciferol (CALCIUM + D3 PO) Take 1 tablet by mouth 2 (two) times daily. (Patient not taking: Reported on 03/04/2023)   fluticasone (FLONASE) 50 MCG/ACT nasal spray Place 1 spray into both nostrils daily. (Patient not taking: Reported on 03/04/2023)   lovastatin (MEVACOR) 40 MG tablet Take 40 mg by mouth at bedtime.    Multiple Vitamin (MULTIVITAMIN WITH MINERALS) TABS tablet Take 1 tablet by mouth daily with lunch. (Patient not taking: Reported on 03/04/2023)   predniSONE (DELTASONE) 20 MG tablet Take 2 tablets (40 mg total) by mouth daily. (Patient not taking: Reported on 03/04/2023)   Pseudoeph-Doxylamine-DM-APAP (NYQUIL PO) Take by mouth. (Patient not taking: Reported on 03/04/2023)   tretinoin (  RETIN-A) 0.1 % cream APPLY 1 APPLICATION TO AFFECTED AREAS ON FACE AND CHEST AT BEDTIME EVERY OTHER NIGHT.   vitamin B-12 (CYANOCOBALAMIN) 1000 MCG tablet Take 1,000 mcg by mouth daily.   No facility-administered encounter medications on file as of 03/04/2023.    Allergies (verified) Empagliflozin   History: Past Medical History:  Diagnosis Date   Allergy    Anemia    Arthritis    Cardiomegaly    CHF (congestive heart failure) (HCC)    Dysplastic nevus 07/16/2019   Right upper back. Mild atypia, close to margin.   Hyperlipidemia    Hypertension    Sleep  apnea    MILD   Past Surgical History:  Procedure Laterality Date   BREAST EXCISIONAL BIOPSY Right 1976   BREAST MASS EXCISION Right 1976   benign   CATARACT EXTRACTION W/PHACO Left 07/29/2017   Procedure: CATARACT EXTRACTION PHACO AND INTRAOCULAR LENS PLACEMENT (IOC)-LEFT;  Surgeon: Galen Manila, MD;  Location: [facility] ORS;  Service: Ophthalmology;  Laterality: Left;  [place] 00:49 AP% 15.6 CDE 7.78 Fluid pack lot # [id number]   CATARACT EXTRACTION W/PHACO Right 09/02/2017   Procedure: CATARACT EXTRACTION PHACO AND INTRAOCULAR LENS PLACEMENT (IOC);  Surgeon: Galen Manila, MD;  Location: [facility] OR[facility];  Service: Ophthalmology;  Laterality: Right;  [place] 00:58.7 AP% 18.0 CDE 10.58 Fluid Pack lot # [id number]   COLONOSCOPY  03-29-05   Dr Evette Cristal   COLONOSCOPY N/A 02/01/2015   Procedure: COLONOSCOPY;  Surgeon: Kieth Brightly, MD;  Location: [facility];  Service: Endoscopy;  Laterality: N/A;   venous closure procedure Right 07-25-05   Dr. Evette Cristal   Family History  Problem Relation Age of Onset   Diabetes Father    Ovarian cancer Sister    Breast cancer Sister 84   Healthy Sister    Healthy Brother    Healthy Sister    Breast cancer Maternal Aunt    Social History   Socioeconomic History   Marital status: Divorced    Spouse name: Not on file   Number of children: 4   Years of education: HS   Highest education level: 12th grade  Occupational History   Occupation: retired  Tobacco Use   Smoking status: Never   Smokeless tobacco: Never  Building services engineer Use: Never used  Substance and Sexual Activity   Alcohol use: No    Alcohol/week: 0.0 standard drinks of alcohol   Drug use: No   Sexual activity: Yes    Birth control/protection: Post-menopausal    Comment: pt states, "I have a friend"  Other Topics Concern   Not on file  Social History Narrative   Not on file   Social Determinants of Health   Financial Resource Strain: Low Risk  (03/04/2023)   Overall Financial  Resource Strain (CARDIA)    Difficulty of Paying Living Expenses: Not hard at all  Food Insecurity: No Food Insecurity (03/04/2023)   Hunger Vital Sign    Worried About Running Out of Food in the Last Year: Never true    Ran Out of Food in the Last Year: Never true  Transportation Needs: No Transportation Needs (03/04/2023)   PRAPARE - Administrator, Civil Service (Medical): No    Lack of Transportation (Non-Medical): No  Physical Activity: Insufficiently Active (03/04/2023)   Exercise Vital Sign    Days of Exercise per Week: 4 days    Minutes of Exercise per Session: 30 min  Stress: No Stress Concern Present (02/03/2020)  [employer] of Occupational Health - Occupational Stress Questionnaire    Feeling of Stress : Only a little  Social Connections: Moderately Isolated (03/04/2023)   Social Connection and Isolation Panel [NHANES]    Frequency of Communication with Friends and Family: More than three times a week    Frequency of Social Gatherings with Friends and Family: More than three times a week    Attends Religious Services: More than 4 times per year    Active Member of [employer] or Organizations: No    Attends Engineer, structural: Never    Marital Status: Divorced    Tobacco Counseling Counseling given: Not Answered   Clinical Intake:  Pre-visit preparation completed: Yes  Pain : No/denies pain     BMI - recorded: 22.47 Nutritional Status: BMI of 19-24  Normal Nutritional Risks: None Diabetes: Yes CBG done?: No Did pt. bring in CBG monitor from home?: No  How often do you need to have someone help you when you read instructions, pamphlets, or other written materials from your doctor or pharmacy?: 1 - Never  Diabetic?yes  Interpreter Needed?: No  Comments: lives alone Information entered by :: B.Joshawa Dubin,LPN   Activities of Daily Living    03/04/2023    2:34 PM 05/01/2022    9:48 AM  In your present state of health, do you have any  difficulty performing the following activities:  Hearing? 1 0  Vision? 0 0  Difficulty concentrating or making decisions? 0 0  Walking or climbing stairs? 0 0  Dressing or bathing? 0 0  Doing errands, shopping? 0 0  Preparing Food and eating ? N   Using the Toilet? N   In the past six months, have you accidently leaked urine? N   Do you have problems with loss of bowel control? N   Managing your Medications? N   Managing your Finances? N   Housekeeping or managing your Housekeeping? N     Patient Care Team: Jacky Kindle, FNP as PCP - General (Family Medicine) Lamar Blinks, MD as Consulting Physician (Cardiology) Nevada Crane, MD as Consulting Physician (Ophthalmology) Galen Manila, MD as Referring Physician (Ophthalmology)  Indicate any recent Medical Services you may have received from other than [facility] providers in the past year (date may be approximate).     Assessment:   This is a routine wellness examination for [place].  Hearing/Vision screen Hearing Screening - Comments:: Adequate hearing ; has buzz in left ear Vision Screening - Comments:: Adequate vision;readers only after cataract surgery Dr Azucena Cecil  Dietary issues and exercise activities discussed: Current Exercise Habits: Home exercise routine, Type of exercise: walking (gardening), Time (Minutes): 30, Frequency (Times/Week): 4, Weekly Exercise (Minutes/Week): 120, Intensity: Mild, Exercise limited by: orthopedic condition(s);cardiac condition(s);neurologic condition(s)   Goals Addressed             This Visit's Progress    DIET - INCREASE WATER INTAKE   On track    Recommend increasing water intake to 4-6 glasses a day.        Depression Screen    03/04/2023    2:31 PM 05/01/2022    9:48 AM 10/31/2021    9:18 AM 04/30/2021    1:31 PM 03/23/2020    2:20 PM 02/03/2020    8:45 AM 02/02/2019    8:47 AM  PHQ 2/9 Scores  PHQ - 2 Score 0 0 0 0 0 0 0  PHQ- 9 Score  1 0 0   0  Fall Risk     03/04/2023    2:27 PM 05/01/2022    9:48 AM 10/31/2021    9:18 AM 04/30/2021    1:30 PM 02/03/2020    8:50 AM  Fall Risk   Falls in the past year? 0 0 0 0 0  Number falls in past yr: 0 0  0 0  Injury with Fall? 0 0  0 0  Risk for fall due to : No Fall Risks   No Fall Risks   Follow up Education provided;Falls prevention discussed   Falls evaluation completed     FALL RISK PREVENTION PERTAINING TO THE HOME:  Any stairs in or around the home? No  If so, are there any without handrails? No  Home free of loose throw rugs in walkways, pet beds, electrical cords, etc? Yes  Adequate lighting in your home to reduce risk of falls? Yes   ASSISTIVE DEVICES UTILIZED TO PREVENT FALLS:  Life alert? No  Use of a cane, walker or w/c? No  Grab bars in the bathroom? Yes  Shower chair or bench in shower? No  Elevated toilet seat or a handicapped toilet? Yes     Cognitive Function:        03/04/2023    2:36 PM 02/03/2020    8:54 AM 02/02/2019    8:53 AM 01/29/2018    8:43 AM  6CIT Screen  What Year? 0 points 0 points 0 points 0 points  What month? 0 points 0 points 0 points 0 points  What time? 0 points 0 points 0 points 0 points  Count back from 20 0 points 0 points 0 points 0 points  Months in reverse 0 points 0 points 0 points 0 points  Repeat phrase 0 points 0 points 2 points 2 points  Total Score 0 points 0 points 2 points 2 points    Immunizations Immunization History  Administered Date(s) Administered   Influenza, High Dose Seasonal PF 07/14/2014, 06/12/2015, 06/28/2017   Influenza-Unspecified 07/06/2018, 07/24/2021   Pneumococcal Conjugate-13 12/07/2014   Pneumococcal Polysaccharide-23 09/26/2011   Tdap 02/10/2006   Zoster Recombinat (Shingrix) 09/11/2018, 12/08/2018    TDAP status: Up to date  Flu Vaccine status: Declined, Education has been provided regarding the importance of this vaccine but patient still declined. Advised may receive this vaccine at local pharmacy or Health  Dept. Aware to provide a copy of the vaccination record if obtained from local pharmacy or Health Dept. Verbalized acceptance and understanding.  Pneumococcal vaccine status: Up to date  Covid-19 vaccine status: Declined, Education has been provided regarding the importance of this vaccine but patient still declined. Advised may receive this vaccine at local pharmacy or Health Dept.or vaccine clinic. Aware to provide a copy of the vaccination record if obtained from local pharmacy or Health Dept. Verbalized acceptance and understanding.  Qualifies for Shingles Vaccine? Yes   Zostavax completed Yes   Shingrix Completed?: Yes  Screening Tests Health Maintenance  Topic Date Due   COVID-19 Vaccine (1) Never done   DTaP/Tdap/Td (2 - Td or Tdap) 02/11/2016   FOOT EXAM  04/30/2022   HEMOGLOBIN A1C  11/01/2022   INFLUENZA VACCINE  04/24/2023   Diabetic kidney evaluation - eGFR measurement  05/02/2023   Diabetic kidney evaluation - Urine ACR  05/02/2023   OPHTHALMOLOGY EXAM  05/24/2023   Medicare Annual Wellness (AWV)  03/03/2024   Pneumonia Vaccine 42+ Years old  Completed   DEXA SCAN  Completed   Hepatitis C Screening  Completed   Zoster Vaccines- Shingrix  Completed   HPV VACCINES  Aged Out   Colonoscopy  Discontinued    Health Maintenance  Health Maintenance Due  Topic Date Due   COVID-19 Vaccine (1) Never done   DTaP/Tdap/Td (2 - Td or Tdap) 02/11/2016   FOOT EXAM  04/30/2022   HEMOGLOBIN A1C  11/01/2022    Colorectal cancer screening: No longer required.   Mammogram status: No longer required due to age.  Bone Density status: Completed yes. Results reflect: Bone density results: NORMAL. Repeat every 5 years.  Lung Cancer Screening: (Low Dose CT Chest recommended if Age 45-80 years, 30 pack-year currently smoking OR have quit w/in 15years.) does not qualify.   Lung Cancer Screening Referral: no  Additional Screening:  Hepatitis C Screening: does not qualify; Completed  yes  Vision Screening: Recommended annual ophthalmology exams for early detection of glaucoma and other disorders of the eye. Is the patient up to date with their annual eye exam?  Yes  Who is the provider or what is the name of the office in which the patient attends annual eye exams? Dr Azucena Cecil If pt is not established with a provider, would they like to be referred to a provider to establish care? No .   Dental Screening: Recommended annual dental exams for proper oral hygiene  Community Resource Referral / Chronic Care Management: CRR required this visit?  No   CCM required this visit?  No      Plan:     I have personally reviewed and noted the following in the patient's chart:   Medical and social history Use of alcohol, tobacco or illicit drugs  Current medications and supplements including opioid prescriptions. Patient is not currently taking opioid prescriptions. Functional ability and status Nutritional status Physical activity Advanced directives List of other physicians Hospitalizations, surgeries, and ER visits in previous 12 months Vitals Screenings to include cognitive, depression, and falls Referrals and appointments  In addition, I have reviewed and discussed with patient certain preventive protocols, quality metrics, and best practice recommendations. A written personalized care plan for preventive services as well as general preventive health recommendations were provided to patient.     Sue Lush, LPN   1/61/0960   Nurse Notes: The patient states she is doing well and has no concerns or questions at this time.

## 2023-03-25 DIAGNOSIS — M9901 Segmental and somatic dysfunction of cervical region: Secondary | ICD-10-CM | POA: Diagnosis not present

## 2023-03-25 DIAGNOSIS — M9904 Segmental and somatic dysfunction of sacral region: Secondary | ICD-10-CM | POA: Diagnosis not present

## 2023-03-25 DIAGNOSIS — M9903 Segmental and somatic dysfunction of lumbar region: Secondary | ICD-10-CM | POA: Diagnosis not present

## 2023-03-25 DIAGNOSIS — M9902 Segmental and somatic dysfunction of thoracic region: Secondary | ICD-10-CM | POA: Diagnosis not present

## 2023-04-14 ENCOUNTER — Other Ambulatory Visit: Payer: Self-pay | Admitting: Family Medicine

## 2023-04-14 DIAGNOSIS — Z1231 Encounter for screening mammogram for malignant neoplasm of breast: Secondary | ICD-10-CM

## 2023-04-29 DIAGNOSIS — M9904 Segmental and somatic dysfunction of sacral region: Secondary | ICD-10-CM | POA: Diagnosis not present

## 2023-04-29 DIAGNOSIS — M9901 Segmental and somatic dysfunction of cervical region: Secondary | ICD-10-CM | POA: Diagnosis not present

## 2023-04-29 DIAGNOSIS — M9903 Segmental and somatic dysfunction of lumbar region: Secondary | ICD-10-CM | POA: Diagnosis not present

## 2023-04-29 DIAGNOSIS — M9902 Segmental and somatic dysfunction of thoracic region: Secondary | ICD-10-CM | POA: Diagnosis not present

## 2023-05-06 ENCOUNTER — Ambulatory Visit (INDEPENDENT_AMBULATORY_CARE_PROVIDER_SITE_OTHER): Payer: PPO | Admitting: Family Medicine

## 2023-05-06 ENCOUNTER — Encounter: Payer: Self-pay | Admitting: Family Medicine

## 2023-05-06 VITALS — BP 123/60 | HR 53 | Temp 97.5°F | Ht 65.0 in | Wt 129.0 lb

## 2023-05-06 DIAGNOSIS — H93292 Other abnormal auditory perceptions, left ear: Secondary | ICD-10-CM | POA: Diagnosis not present

## 2023-05-06 DIAGNOSIS — J3089 Other allergic rhinitis: Secondary | ICD-10-CM

## 2023-05-06 DIAGNOSIS — Z0000 Encounter for general adult medical examination without abnormal findings: Secondary | ICD-10-CM

## 2023-05-06 DIAGNOSIS — Z1231 Encounter for screening mammogram for malignant neoplasm of breast: Secondary | ICD-10-CM

## 2023-05-06 DIAGNOSIS — Z794 Long term (current) use of insulin: Secondary | ICD-10-CM | POA: Diagnosis not present

## 2023-05-06 DIAGNOSIS — N1832 Chronic kidney disease, stage 3b: Secondary | ICD-10-CM | POA: Diagnosis not present

## 2023-05-06 DIAGNOSIS — I5022 Chronic systolic (congestive) heart failure: Secondary | ICD-10-CM | POA: Diagnosis not present

## 2023-05-06 DIAGNOSIS — I429 Cardiomyopathy, unspecified: Secondary | ICD-10-CM

## 2023-05-06 DIAGNOSIS — E785 Hyperlipidemia, unspecified: Secondary | ICD-10-CM | POA: Diagnosis not present

## 2023-05-06 DIAGNOSIS — E1169 Type 2 diabetes mellitus with other specified complication: Secondary | ICD-10-CM | POA: Diagnosis not present

## 2023-05-06 DIAGNOSIS — E1122 Type 2 diabetes mellitus with diabetic chronic kidney disease: Secondary | ICD-10-CM

## 2023-05-06 DIAGNOSIS — I152 Hypertension secondary to endocrine disorders: Secondary | ICD-10-CM

## 2023-05-06 DIAGNOSIS — E1159 Type 2 diabetes mellitus with other circulatory complications: Secondary | ICD-10-CM

## 2023-05-06 MED ORDER — AZELASTINE-FLUTICASONE 137-50 MCG/ACT NA SUSP
1.0000 | Freq: Two times a day (BID) | NASAL | 0 refills | Status: AC
Start: 2023-05-06 — End: ?

## 2023-05-06 NOTE — Patient Instructions (Signed)
Encourage flu vaccine later this season

## 2023-05-06 NOTE — Assessment & Plan Note (Signed)
Chronic, stable Repeat CMP Remains on farxiga 5 mg daily

## 2023-05-06 NOTE — Assessment & Plan Note (Signed)
Due for vision Things to do to keep yourself healthy  - Exercise at least 30-45 minutes a day, 3-4 days a week.  - Eat a low-fat diet with lots of fruits and vegetables, up to 7-9 servings per day.  - Seatbelts can save your life. Wear them always.  - Smoke detectors on every level of your home, check batteries every year.  - Eye Doctor - have an eye exam every 1-2 years  - Safe sex - if you may be exposed to STDs, use a condom.  - Alcohol -  If you drink, do it moderately, less than 2 drinks per day.  - Health Care Power of Attorney. Choose someone to speak for you if you are not able.  - Depression is common in our stressful world.If you're feeling down or losing interest in things you normally enjoy, please come in for a visit.  - Violence - If anyone is threatening or hurting you, please call immediately.

## 2023-05-06 NOTE — Progress Notes (Signed)
Complete physical exam   Patient: Dawn Roberts   DOB: 1946/06/25   77 y.o. Female  MRN: [id number] Visit Date: 05/06/2023  Today's healthcare provider: Jacky Kindle, FNP  Re Introduced to nurse practitioner role and practice setting.  All questions answered.  Discussed provider/patient relationship and expectations.  Chief Complaint  Patient presents with   Annual Exam   Tinnitus    Patient complains of a 'buzzing' in her left ear.  It has been present for about 2-3 months. She also describes echos in her ear.    Subjective    Dawn Roberts is a 77 y.o. female who presents today for a complete physical exam.  She reports consuming a general diet. The patient does not participate in regular exercise at present. She generally feels fairly well. She reports sleeping fairly well. She does have additional problems to discuss today.  HPI HPI     Tinnitus    Additional comments: Patient complains of a 'buzzing' in her left ear.  It has been present for about 2-3 months. She also describes echos in her ear.       Last edited by Adline Peals, CMA on 05/06/2023  9:02 AM.      Past Medical History:  Diagnosis Date   Allergy    Anemia    Arthritis    Cardiomegaly    CHF (congestive heart failure) (HCC)    Dysplastic nevus 07/16/2019   Right upper back. Mild atypia, close to margin.   Hyperlipidemia    Hypertension    Sleep apnea    MILD   Past Surgical History:  Procedure Laterality Date   BREAST EXCISIONAL BIOPSY Right 1976   BREAST MASS EXCISION Right 1976   benign   CATARACT EXTRACTION W/PHACO Left 07/29/2017   Procedure: CATARACT EXTRACTION PHACO AND INTRAOCULAR LENS PLACEMENT (IOC)-LEFT;  Surgeon: Galen Manila, MD;  Location: [facility];  Service: Ophthalmology;  Laterality: Left;  [place] 00:49 AP% 15.6 CDE 7.78 Fluid pack lot # [id number]   CATARACT EXTRACTION W/PHACO Right 09/02/2017   Procedure: CATARACT EXTRACTION PHACO AND INTRAOCULAR LENS PLACEMENT  (IOC);  Surgeon: Galen Manila, MD;  Location: [facility];  Service: Ophthalmology;  Laterality: Right;  [place] 00:58.7 AP% 18.0 CDE 10.58 Fluid Pack lot # [id number]   COLONOSCOPY  03-29-05   Dr Evette Cristal   COLONOSCOPY N/A 02/01/2015   Procedure: COLONOSCOPY;  Surgeon: Kieth Brightly, MD;  Location: [facility];  Service: Endoscopy;  Laterality: N/A;   venous closure procedure Right 07-25-05   Dr. Evette Cristal   Social History   Socioeconomic History   Marital status: Divorced    Spouse name: Not on file   Number of children: 4   Years of education: HS   Highest education level: 12th grade  Occupational History   Occupation: retired  Tobacco Use   Smoking status: Never   Smokeless tobacco: Never  Vaping Use   Vaping status: Never Used  Substance and Sexual Activity   Alcohol use: No    Alcohol/week: 0.0 standard drinks of alcohol   Drug use: No   Sexual activity: Yes    Birth control/protection: Post-menopausal    Comment: pt states, "I have a friend"  Other Topics Concern   Not on file  Social History Narrative   Not on file   Social Determinants of Health   Financial Resource Strain: Low Risk  (03/04/2023)   Overall Financial Resource Strain (CARDIA)    Difficulty of Paying Living  Expenses: Not hard at all  Food Insecurity: No Food Insecurity (03/04/2023)   Hunger Vital Sign    Worried About Running Out of Food in the Last Year: Never true    Ran Out of Food in the Last Year: Never true  Transportation Needs: No Transportation Needs (03/04/2023)   PRAPARE - Administrator, Civil Service (Medical): No    Lack of Transportation (Non-Medical): No  Physical Activity: Insufficiently Active (03/04/2023)   Exercise Vital Sign    Days of Exercise per Week: 4 days    Minutes of Exercise per Session: 30 min  Stress: No Stress Concern Present (02/03/2020)   [employer] of Occupational Health - Occupational Stress Questionnaire    Feeling of Stress : Only a little   Social Connections: Moderately Isolated (03/04/2023)   Social Connection and Isolation Panel [NHANES]    Frequency of Communication with Friends and Family: More than three times a week    Frequency of Social Gatherings with Friends and Family: More than three times a week    Attends Religious Services: More than 4 times per year    Active Member of [employer] or Organizations: No    Attends Banker Meetings: Never    Marital Status: Divorced  Catering manager Violence: Not At Risk (03/04/2023)   Humiliation, Afraid, Rape, and Kick questionnaire    Fear of Current or Ex-Partner: No    Emotionally Abused: No    Physically Abused: No    Sexually Abused: No   Family Status  Relation Name Status   Father  Deceased at age 54   Sister  Deceased at age 38       ovarian cancer   Mother  Deceased at age 30   Sister  Alive   Brother  Alive   Sister  Chemical engineer  (Not Specified)  No partnership data on file   Family History  Problem Relation Age of Onset   Diabetes Father    Ovarian cancer Sister    Breast cancer Sister 4   Healthy Sister    Healthy Brother    Healthy Sister    Breast cancer Maternal Aunt    Allergies  Allergen Reactions   Empagliflozin Other (See Comments)    Yeast infection    Patient Care Team: Jacky Kindle, FNP as PCP - General (Family Medicine) Lamar Blinks, MD as Consulting Physician (Cardiology) Nevada Crane, MD as Consulting Physician (Ophthalmology) Galen Manila, MD as Referring Physician (Ophthalmology)   Medications: Outpatient Medications Prior to Visit  Medication Sig   aspirin EC 81 MG tablet Take 81 mg by mouth daily with supper.   Bioflavonoid Products (VITAMIN C) CHEW Chew 1 tablet by mouth 3 (three) times a week.   carvedilol (COREG) 6.25 MG tablet Take 6.25 mg by mouth 2 (two) times daily.   FARXIGA 5 MG TABS tablet Take 5 mg by mouth every morning.   losartan (COZAAR) 50 MG tablet Take 50 mg by mouth  daily.    lovastatin (MEVACOR) 40 MG tablet TAKE 1 TABLET BY MOUTH EVERY DAY WITH DINNER   metFORMIN (GLUCOPHAGE-XR) 750 MG 24 hr tablet TAKE 1 TABLET BY MOUTH EVERY DAY WITH BREAKFAST   Misc Natural Products (OSTEO BI-FLEX ADV JOINT SHIELD PO) Take 1 tablet by mouth 2 (two) times daily.    spironolactone (ALDACTONE) 25 MG tablet Take 25 mg by mouth daily.   tretinoin (RETIN-A) 0.1 % cream APPLY 1 APPLICATION TO  AFFECTED AREAS ON FACE AND CHEST AT BEDTIME EVERY OTHER NIGHT.   vitamin B-12 (CYANOCOBALAMIN) 1000 MCG tablet Take 1,000 mcg by mouth daily.   [DISCONTINUED] benzonatate (TESSALON) 100 MG capsule Take 1 capsule (100 mg total) by mouth 3 (three) times daily as needed for cough. Do not take with alcohol or while driving or operating heavy machinery.  May cause drowsiness. (Patient not taking: Reported on 03/04/2023)   [DISCONTINUED] Calcium Carb-Cholecalciferol (CALCIUM + D3 PO) Take 1 tablet by mouth 2 (two) times daily. (Patient not taking: Reported on 03/04/2023)   [DISCONTINUED] dapagliflozin propanediol (FARXIGA) 10 MG TABS tablet Take 1 tablet (10 mg total) by mouth daily before breakfast.   [DISCONTINUED] fluticasone (FLONASE) 50 MCG/ACT nasal spray Place 1 spray into both nostrils daily. (Patient not taking: Reported on 03/04/2023)   [DISCONTINUED] losartan (COZAAR) 100 MG tablet Take 0.5 tablets by mouth daily.   [DISCONTINUED] lovastatin (MEVACOR) 40 MG tablet Take 40 mg by mouth at bedtime.    [DISCONTINUED] metFORMIN (GLUCOPHAGE-XR) 750 MG 24 hr tablet Take 1 tablet by mouth daily with breakfast.   [DISCONTINUED] Multiple Vitamin (MULTIVITAMIN WITH MINERALS) TABS tablet Take 1 tablet by mouth daily with lunch. (Patient not taking: Reported on 03/04/2023)   [DISCONTINUED] predniSONE (DELTASONE) 20 MG tablet Take 2 tablets (40 mg total) by mouth daily. (Patient not taking: Reported on 03/04/2023)   [DISCONTINUED] Pseudoeph-Doxylamine-DM-APAP (NYQUIL PO) Take by mouth. (Patient not taking:  Reported on 03/04/2023)   No facility-administered medications prior to visit.    Objective    BP 123/60 (BP Location: Right Arm, Patient Position: Sitting, Cuff Size: Normal)   Pulse (!) 53   Temp (!) 97.5 F (36.4 C) (Oral)   Ht 5\' 5"  (1.651 m)   Wt 129 lb (58.5 kg)   SpO2 99%   BMI 21.47 kg/m   Physical Exam Vitals and nursing note reviewed.  Constitutional:      General: She is awake. She is not in acute distress.    Appearance: Normal appearance. She is well-developed, well-groomed and normal weight. She is not ill-appearing, toxic-appearing or diaphoretic.  HENT:     Head: Normocephalic and atraumatic.     Jaw: There is normal jaw occlusion. No trismus, tenderness, swelling or pain on movement.     Right Ear: Hearing, tympanic membrane, ear canal and external ear normal. There is no impacted cerumen.     Left Ear: Hearing, tympanic membrane, ear canal and external ear normal. There is no impacted cerumen.     Ears:     Comments: Complaints of ear fluid complaints; encouraged for trial of oral antihistamine like Claritin or zyrtec. Rx provided for nasal spray to assist     Nose: Nose normal. No congestion or rhinorrhea.     Right Turbinates: Not enlarged, swollen or pale.     Left Turbinates: Not enlarged, swollen or pale.     Right Sinus: No maxillary sinus tenderness or frontal sinus tenderness.     Left Sinus: No maxillary sinus tenderness or frontal sinus tenderness.     Mouth/Throat:     Lips: Pink.     Mouth: Mucous membranes are moist. No injury.     Tongue: No lesions.     Pharynx: Oropharynx is clear. Uvula midline. No pharyngeal swelling, oropharyngeal exudate, posterior oropharyngeal erythema or uvula swelling.     Tonsils: No tonsillar exudate or tonsillar abscesses.  Eyes:     General: Lids are normal. Lids are everted, no foreign bodies appreciated. Vision grossly  intact. Gaze aligned appropriately. No allergic shiner or visual field deficit.       Right  eye: No discharge.        Left eye: No discharge.     Extraocular Movements: Extraocular movements intact.     Conjunctiva/sclera: Conjunctivae normal.     Right eye: Right conjunctiva is not injected. No exudate.    Left eye: Left conjunctiva is not injected. No exudate.    Pupils: Pupils are equal, round, and reactive to light.  Neck:     Thyroid: No thyroid mass, thyromegaly or thyroid tenderness.     Vascular: No carotid bruit.     Trachea: Trachea normal.  Cardiovascular:     Rate and Rhythm: Normal rate and regular rhythm.     Pulses: Normal pulses.          Carotid pulses are 2+ on the right side and 2+ on the left side.      Radial pulses are 2+ on the right side and 2+ on the left side.       Dorsalis pedis pulses are 2+ on the right side and 2+ on the left side.       Posterior tibial pulses are 2+ on the right side and 2+ on the left side.     Heart sounds: Normal heart sounds, S1 normal and S2 normal. No murmur heard.    No friction rub. No gallop.  Pulmonary:     Effort: Pulmonary effort is normal. No respiratory distress.     Breath sounds: Normal breath sounds and air entry. No stridor. No wheezing, rhonchi or rales.  Chest:     Chest wall: No tenderness.  Abdominal:     General: Abdomen is flat. Bowel sounds are normal. There is no distension.     Palpations: Abdomen is soft. There is no mass.     Tenderness: There is no abdominal tenderness. There is no right CVA tenderness, left CVA tenderness, guarding or rebound.     Hernia: No hernia is present.  Genitourinary:    Comments: Exam deferred; denies complaints Musculoskeletal:        General: No swelling, tenderness, deformity or signs of injury. Normal range of motion.     Cervical back: Full passive range of motion without pain, normal range of motion and neck supple. No edema, rigidity or tenderness. No muscular tenderness.     Right lower leg: No edema.     Left lower leg: No edema.  Lymphadenopathy:      Cervical: No cervical adenopathy.     Right cervical: No superficial, deep or posterior cervical adenopathy.    Left cervical: No superficial, deep or posterior cervical adenopathy.  Skin:    General: Skin is warm and dry.     Capillary Refill: Capillary refill takes less than 2 seconds.     Coloration: Skin is not jaundiced or pale.     Findings: No bruising, erythema, lesion or rash.  Neurological:     General: No focal deficit present.     Mental Status: She is alert and oriented to person, place, and time. Mental status is at baseline.     GCS: GCS eye subscore is 4. GCS verbal subscore is 5. GCS motor subscore is 6.     Sensory: Sensation is intact. No sensory deficit.     Motor: Motor function is intact. No weakness.     Coordination: Coordination is intact. Coordination normal.     Gait: Gait is intact.  Gait normal.  Psychiatric:        Attention and Perception: Attention and perception normal.        Mood and Affect: Mood and affect normal.        Speech: Speech normal.        Behavior: Behavior normal. Behavior is cooperative.        Thought Content: Thought content normal.        Cognition and Memory: Cognition and memory normal.        Judgment: Judgment normal.     Last depression screening scores    03/04/2023    2:31 PM 05/01/2022    9:48 AM 10/31/2021    9:18 AM  PHQ 2/9 Scores  PHQ - 2 Score 0 0 0  PHQ- 9 Score  1 0   Last fall risk screening    03/04/2023    2:27 PM  Fall Risk   Falls in the past year? 0  Number falls in past yr: 0  Injury with Fall? 0  Risk for fall due to : No Fall Risks  Follow up Education provided;Falls prevention discussed   Last Audit-C alcohol use screening    03/04/2023    2:31 PM  Alcohol Use Disorder Test (AUDIT)  1. How often do you have a drink containing alcohol? 0  2. How many drinks containing alcohol do you have on a typical day when you are drinking? 0  3. How often do you have six or more drinks on one occasion? 0   AUDIT-C Score 0   A score of 3 or more in women, and 4 or more in men indicates increased risk for alcohol abuse, EXCEPT if all of the points are from question 1   No results found for any visits on 05/06/23.  Assessment & Plan    Routine Health Maintenance and Physical Exam  Exercise Activities and Dietary recommendations  Goals      DIET - INCREASE WATER INTAKE     Recommend increasing water intake to 4-6 glasses a day.      HEMOGLOBIN A1C < 7.0        Immunization History  Administered Date(s) Administered   Influenza, High Dose Seasonal PF 07/14/2014, 06/12/2015, 06/28/2017   Influenza-Unspecified 07/06/2018, 07/24/2021   Pneumococcal Conjugate-13 12/07/2014   Pneumococcal Polysaccharide-23 09/26/2011   Tdap 02/10/2006   Zoster Recombinant(Shingrix) 09/11/2018, 12/08/2018    Health Maintenance  Topic Date Due   COVID-19 Vaccine (1) Never done   DTaP/Tdap/Td (2 - Td or Tdap) 02/11/2016   FOOT EXAM  04/30/2022   HEMOGLOBIN A1C  11/01/2022   Diabetic kidney evaluation - eGFR measurement  05/02/2023   Diabetic kidney evaluation - Urine ACR  05/02/2023   INFLUENZA VACCINE  12/22/2023 (Originally 04/24/2023)   OPHTHALMOLOGY EXAM  05/24/2023   Medicare Annual Wellness (AWV)  05/05/2024   Pneumonia Vaccine 38+ Years old  Completed   DEXA SCAN  Completed   Hepatitis C Screening  Completed   Zoster Vaccines- Shingrix  Completed   HPV VACCINES  Aged Out   Colonoscopy  Discontinued    Discussed health benefits of physical activity, and encouraged her to engage in regular exercise appropriate for her age and condition.  Problem List Items Addressed This Visit       Cardiovascular and Mediastinum   Cardiomyopathy (HCC)    Chronic, stable Followed by cardiology Remains on GDMT      Chronic systolic heart failure (HCC)    Chronic, stable Followed  by cardiology Remains on GDMT      Hypertension associated with diabetes (HCC)    Chronic, stable At goal Repeat  CBC, CMP, TSH Continue Coreg 6.25 mg BID, Farxiga 5 mg, Losartan 50 mg and Spiro 25 mg       Relevant Orders   Comprehensive metabolic panel   CBC   TSH     Respiratory   Allergic rhinitis    Chronic, with recent exacerbation in L ear given concern for eustachian tube dysfunction Recommend OTC oral antihistamine and nasal medication to further assist F/u as needed Pt declines referral to ENT at this time       Relevant Medications   Azelastine-Fluticasone 137-50 MCG/ACT SUSP     Endocrine   Hyperlipidemia associated with type 2 diabetes mellitus (HCC)    Chronic, previously at goal Repeat LP recommend diet low in saturated fat and regular exercise - 30 min at least 5 times per week Remains on mevacor 40 mg       Relevant Orders   Comprehensive metabolic panel   Lipid Panel With LDL/HDL Ratio   Type 2 diabetes mellitus with stage 3b chronic kidney disease, with long-term current use of insulin (HCC)    Chronic, stable Repeat A1c and urine micro Pt denies foot exam at this time Continue to recommend balanced, lower carb meals. Smaller meal size, adding snacks. Choosing water as drink of choice and increasing purposeful exercise. Remains on farxiga 5 mg and metformin 750 mg daily       Relevant Orders   Comprehensive metabolic panel   Hemoglobin A1c   Microalbumin / creatinine urine ratio     Nervous and Auditory   Auditory complaints of left ear    Iso allergic rhinitis; recommend continued oral antihistamine as well as nasal spray rx to assist; likely eustachian dysfunction       Relevant Medications   Azelastine-Fluticasone 137-50 MCG/ACT SUSP     Genitourinary   Stage 3b chronic kidney disease (HCC)    Chronic, stable Repeat CMP Remains on farxiga 5 mg daily       Relevant Orders   Comprehensive metabolic panel   Hemoglobin A1c     Other   Annual physical exam - Primary    Due for vision Things to do to keep yourself healthy  - Exercise at least  30-45 minutes a day, 3-4 days a week.  - Eat a low-fat diet with lots of fruits and vegetables, up to 7-9 servings per day.  - Seatbelts can save your life. Wear them always.  - Smoke detectors on every level of your home, check batteries every year.  - Eye Doctor - have an eye exam every 1-2 years  - Safe sex - if you may be exposed to STDs, use a condom.  - Alcohol -  If you drink, do it moderately, less than 2 drinks per day.  - Health Care Power of Attorney. Choose someone to speak for you if you are not able.  - Depression is common in our stressful world.If you're feeling down or losing interest in things you normally enjoy, please come in for a visit.  - Violence - If anyone is threatening or hurting you, please call immediately.       Screening mammogram for breast cancer    Due for screening for mammogram, denies breast concerns, provided with phone number to call and schedule appointment for mammogram. Encouraged to repeat breast cancer screening every 1-2 years.  Relevant Orders   MM 3D SCREENING MAMMOGRAM BILATERAL BREAST   Return in about 6 months (around 11/06/2023), or if symptoms worsen or fail to improve, for chonic disease management.    Leilani Merl, FNP, have reviewed all documentation for this visit. The documentation on 05/06/23 for the exam, diagnosis, procedures, and orders are all accurate and complete.  Jacky Kindle, FNP  [facility] [phone] (phone) [phone] (fax)  [facility]

## 2023-05-06 NOTE — Assessment & Plan Note (Signed)
Chronic, stable Repeat A1c and urine micro Pt denies foot exam at this time Continue to recommend balanced, lower carb meals. Smaller meal size, adding snacks. Choosing water as drink of choice and increasing purposeful exercise. Remains on farxiga 5 mg and metformin 750 mg daily

## 2023-05-06 NOTE — Assessment & Plan Note (Signed)
Chronic, with recent exacerbation in L ear given concern for eustachian tube dysfunction Recommend OTC oral antihistamine and nasal medication to further assist F/u as needed Pt declines referral to ENT at this time

## 2023-05-06 NOTE — Assessment & Plan Note (Signed)
Chronic, stable At goal Repeat CBC, CMP, TSH Continue Coreg 6.25 mg BID, Farxiga 5 mg, Losartan 50 mg and Spiro 25 mg

## 2023-05-06 NOTE — Assessment & Plan Note (Signed)
Chronic, previously at goal Repeat LP recommend diet low in saturated fat and regular exercise - 30 min at least 5 times per week Remains on mevacor 40 mg

## 2023-05-06 NOTE — Assessment & Plan Note (Signed)
Due for screening for mammogram, denies breast concerns, provided with phone number to call and schedule appointment for mammogram. Encouraged to repeat breast cancer screening every 1-2 years.  

## 2023-05-06 NOTE — Assessment & Plan Note (Signed)
Chronic, stable Followed by cardiology Remains on GDMT

## 2023-05-06 NOTE — Assessment & Plan Note (Signed)
Iso allergic rhinitis; recommend continued oral antihistamine as well as nasal spray rx to assist; likely eustachian dysfunction

## 2023-05-07 NOTE — Progress Notes (Signed)
Stable Creatinine in 1.2's  Diabetes noted on A1c; Continue to recommend balanced, lower carb meals. Smaller meal size, adding snacks. Choosing water as drink of choice and increasing purposeful exercise. Cholesterol at goal; I continue to recommend diet low in saturated fat and regular exercise - 30 min at least 5 times per week Normal cell count and thyroid Urine pending.

## 2023-05-08 NOTE — Progress Notes (Signed)
Urine micro is elevated from previous samples; however, remains stable. Repeat annually

## 2023-05-27 ENCOUNTER — Ambulatory Visit
Admission: RE | Admit: 2023-05-27 | Discharge: 2023-05-27 | Disposition: A | Discharge status: Home / Self Care | Payer: PPO | Source: Ambulatory Visit | Attending: Family Medicine | Admitting: Family Medicine

## 2023-05-27 DIAGNOSIS — Z1231 Encounter for screening mammogram for malignant neoplasm of breast: Secondary | ICD-10-CM | POA: Diagnosis not present

## 2023-05-27 DIAGNOSIS — M9902 Segmental and somatic dysfunction of thoracic region: Secondary | ICD-10-CM | POA: Diagnosis not present

## 2023-05-27 DIAGNOSIS — M9903 Segmental and somatic dysfunction of lumbar region: Secondary | ICD-10-CM | POA: Diagnosis not present

## 2023-05-27 DIAGNOSIS — M9904 Segmental and somatic dysfunction of sacral region: Secondary | ICD-10-CM | POA: Diagnosis not present

## 2023-05-27 DIAGNOSIS — M9901 Segmental and somatic dysfunction of cervical region: Secondary | ICD-10-CM | POA: Diagnosis not present

## 2023-05-28 NOTE — Progress Notes (Signed)
Hi Dawn Roberts  Normal mammogram; repeat in 1 year.  Please let us know if you have any questions.  Thank you,  Tally Joe, FNP

## 2023-06-09 DIAGNOSIS — I10 Essential (primary) hypertension: Secondary | ICD-10-CM | POA: Diagnosis not present

## 2023-06-09 DIAGNOSIS — I255 Ischemic cardiomyopathy: Secondary | ICD-10-CM | POA: Diagnosis not present

## 2023-06-09 DIAGNOSIS — N1832 Chronic kidney disease, stage 3b: Secondary | ICD-10-CM | POA: Diagnosis not present

## 2023-06-09 DIAGNOSIS — E1122 Type 2 diabetes mellitus with diabetic chronic kidney disease: Secondary | ICD-10-CM | POA: Diagnosis not present

## 2023-06-09 DIAGNOSIS — G4730 Sleep apnea, unspecified: Secondary | ICD-10-CM | POA: Diagnosis not present

## 2023-06-09 DIAGNOSIS — R8290 Unspecified abnormal findings in urine: Secondary | ICD-10-CM | POA: Diagnosis not present

## 2023-06-09 DIAGNOSIS — I509 Heart failure, unspecified: Secondary | ICD-10-CM | POA: Diagnosis not present

## 2023-06-09 DIAGNOSIS — E785 Hyperlipidemia, unspecified: Secondary | ICD-10-CM | POA: Diagnosis not present

## 2023-06-17 DIAGNOSIS — E1122 Type 2 diabetes mellitus with diabetic chronic kidney disease: Secondary | ICD-10-CM | POA: Diagnosis not present

## 2023-06-17 DIAGNOSIS — I509 Heart failure, unspecified: Secondary | ICD-10-CM | POA: Diagnosis not present

## 2023-06-17 DIAGNOSIS — I10 Essential (primary) hypertension: Secondary | ICD-10-CM | POA: Diagnosis not present

## 2023-06-17 DIAGNOSIS — N1832 Chronic kidney disease, stage 3b: Secondary | ICD-10-CM | POA: Diagnosis not present

## 2023-06-17 DIAGNOSIS — I255 Ischemic cardiomyopathy: Secondary | ICD-10-CM | POA: Diagnosis not present

## 2023-06-18 DIAGNOSIS — H3554 Dystrophies primarily involving the retinal pigment epithelium: Secondary | ICD-10-CM | POA: Diagnosis not present

## 2023-06-18 DIAGNOSIS — H43813 Vitreous degeneration, bilateral: Secondary | ICD-10-CM | POA: Diagnosis not present

## 2023-06-18 DIAGNOSIS — Z961 Presence of intraocular lens: Secondary | ICD-10-CM | POA: Diagnosis not present

## 2023-06-18 DIAGNOSIS — Z9889 Other specified postprocedural states: Secondary | ICD-10-CM | POA: Diagnosis not present

## 2023-06-18 LAB — HM DIABETES EYE EXAM

## 2023-06-24 DIAGNOSIS — M9903 Segmental and somatic dysfunction of lumbar region: Secondary | ICD-10-CM | POA: Diagnosis not present

## 2023-06-24 DIAGNOSIS — M9902 Segmental and somatic dysfunction of thoracic region: Secondary | ICD-10-CM | POA: Diagnosis not present

## 2023-06-24 DIAGNOSIS — M9904 Segmental and somatic dysfunction of sacral region: Secondary | ICD-10-CM | POA: Diagnosis not present

## 2023-06-24 DIAGNOSIS — M9901 Segmental and somatic dysfunction of cervical region: Secondary | ICD-10-CM | POA: Diagnosis not present

## 2023-07-29 DIAGNOSIS — G4730 Sleep apnea, unspecified: Secondary | ICD-10-CM | POA: Diagnosis not present

## 2023-07-29 DIAGNOSIS — M9904 Segmental and somatic dysfunction of sacral region: Secondary | ICD-10-CM | POA: Diagnosis not present

## 2023-07-29 DIAGNOSIS — M9902 Segmental and somatic dysfunction of thoracic region: Secondary | ICD-10-CM | POA: Diagnosis not present

## 2023-07-29 DIAGNOSIS — E782 Mixed hyperlipidemia: Secondary | ICD-10-CM | POA: Diagnosis not present

## 2023-07-29 DIAGNOSIS — M9903 Segmental and somatic dysfunction of lumbar region: Secondary | ICD-10-CM | POA: Diagnosis not present

## 2023-07-29 DIAGNOSIS — I420 Dilated cardiomyopathy: Secondary | ICD-10-CM | POA: Diagnosis not present

## 2023-07-29 DIAGNOSIS — I5022 Chronic systolic (congestive) heart failure: Secondary | ICD-10-CM | POA: Diagnosis not present

## 2023-07-29 DIAGNOSIS — I10 Essential (primary) hypertension: Secondary | ICD-10-CM | POA: Diagnosis not present

## 2023-07-29 DIAGNOSIS — M9901 Segmental and somatic dysfunction of cervical region: Secondary | ICD-10-CM | POA: Diagnosis not present

## 2023-08-04 ENCOUNTER — Other Ambulatory Visit: Payer: Self-pay | Admitting: Family Medicine

## 2023-08-19 ENCOUNTER — Ambulatory Visit: Payer: PPO | Admitting: Dermatology

## 2023-08-19 DIAGNOSIS — I878 Other specified disorders of veins: Secondary | ICD-10-CM

## 2023-08-19 DIAGNOSIS — L578 Other skin changes due to chronic exposure to nonionizing radiation: Secondary | ICD-10-CM

## 2023-08-19 DIAGNOSIS — D224 Melanocytic nevi of scalp and neck: Secondary | ICD-10-CM

## 2023-08-19 DIAGNOSIS — Z1283 Encounter for screening for malignant neoplasm of skin: Secondary | ICD-10-CM | POA: Diagnosis not present

## 2023-08-19 DIAGNOSIS — D2261 Melanocytic nevi of right upper limb, including shoulder: Secondary | ICD-10-CM

## 2023-08-19 DIAGNOSIS — L821 Other seborrheic keratosis: Secondary | ICD-10-CM | POA: Diagnosis not present

## 2023-08-19 DIAGNOSIS — L814 Other melanin hyperpigmentation: Secondary | ICD-10-CM

## 2023-08-19 DIAGNOSIS — I781 Nevus, non-neoplastic: Secondary | ICD-10-CM | POA: Diagnosis not present

## 2023-08-19 DIAGNOSIS — W908XXA Exposure to other nonionizing radiation, initial encounter: Secondary | ICD-10-CM

## 2023-08-19 DIAGNOSIS — D1801 Hemangioma of skin and subcutaneous tissue: Secondary | ICD-10-CM

## 2023-08-19 DIAGNOSIS — I8393 Asymptomatic varicose veins of bilateral lower extremities: Secondary | ICD-10-CM

## 2023-08-19 DIAGNOSIS — D2271 Melanocytic nevi of right lower limb, including hip: Secondary | ICD-10-CM | POA: Diagnosis not present

## 2023-08-19 DIAGNOSIS — L720 Epidermal cyst: Secondary | ICD-10-CM

## 2023-08-19 DIAGNOSIS — R238 Other skin changes: Secondary | ICD-10-CM

## 2023-08-19 DIAGNOSIS — Z86018 Personal history of other benign neoplasm: Secondary | ICD-10-CM

## 2023-08-19 DIAGNOSIS — Z872 Personal history of diseases of the skin and subcutaneous tissue: Secondary | ICD-10-CM

## 2023-08-19 DIAGNOSIS — D229 Melanocytic nevi, unspecified: Secondary | ICD-10-CM

## 2023-08-19 MED ORDER — TRETINOIN 0.1 % EX CREA
TOPICAL_CREAM | CUTANEOUS | 6 refills | Status: AC
Start: 2023-08-19 — End: ?

## 2023-08-19 NOTE — Progress Notes (Signed)
Follow-Up Visit   Subjective  Dawn Roberts is a 77 y.o. female who presents for the following: Skin Cancer Screening and Full Body Skin Exam  The patient presents for Total-Body Skin Exam (TBSE) for skin cancer screening and mole check. The patient has spots, moles and lesions to be evaluated, some may be new or changing. 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    The following portions of the chart were reviewed this encounter and updated as appropriate: medications, allergies, medical history  Review of Systems:  No other skin or systemic complaints except as noted in HPI or Assessment and Plan.  Objective  Well appearing patient in no apparent distress; mood and affect are within normal limits.  A full examination was performed including scalp, head, eyes, ears, nose, lips, neck, chest, axillae, abdomen, back, buttocks, bilateral upper extremities, bilateral lower extremities, hands, feet, fingers, toes, fingernails, and toenails. All findings within normal limits unless otherwise noted below.   Relevant physical exam findings are noted in the Assessment and Plan.    Assessment & Plan   SKIN CANCER SCREENING PERFORMED TODAY.  ACTINIC DAMAGE - Chronic condition, secondary to cumulative UV/sun exposure - diffuse scaly erythematous macules with underlying dyspigmentation - Recommend daily broad spectrum sunscreen SPF 30+ to sun-exposed areas, reapply every 2 hours as needed.  - Staying in the shade or wearing long sleeves, sun glasses (UVA+UVB protection) and wide brim hats (4-inch brim around the entire circumference of the hat) are also recommended for sun protection.  - Call for new or changing lesions.  LENTIGINES, SEBORRHEIC KERATOSES, HEMANGIOMAS - Benign normal skin lesions - Benign-appearing - Call for any changes  MELANOCYTIC NEVI - Tan-brown and/or pink-flesh-colored symmetric macules and papules; including flesh papules x  2 at left occipital scalp - right anterior thigh 2.5 mm med dark brown macule - right upper arm 2.0 mm med dark brown macule - Benign appearing on exam today - Observation - Call clinic for new or changing moles - Recommend daily use of broad spectrum spf 30+ sunscreen to sun-exposed areas.   History of Dysplastic Nevus - No evidence of recurrence today of the right upper back, mild (2020) - Recommend regular full body skin exams - Recommend daily broad spectrum sunscreen SPF 30+ to sun-exposed areas, reapply every 2 hours as needed.  - Call if any new or changing lesions are noted between office visits  Varicose Veins/Spider Veins - Dilated blue, purple or red veins at the lower extremities - Reassured - Smaller vessels can be treated by sclerotherapy (a procedure to inject a medicine into the veins to make them disappear) if desired, but the treatment is not covered by insurance. Larger vessels may be covered if symptomatic and we would refer to vascular surgeon if treatment desired.  Venous lake Right Lower [place] Lip Soft purple papule, 4.0 mm.   Benign, observe.   HISTORY OF PRECANCEROUS ACTINIC KERATOSIS - site(s) of PreCancerous Actinic Keratosis clear today, right lower cheek - these may recur and new lesions may form requiring treatment to prevent transformation into skin cancer - observe for new or changing spots and contact [facility] for appointment if occur - photoprotection with sun protective clothing; sunglasses and broad spectrum sunscreen with SPF of at least 30 + and frequent self skin exams recommended - yearly exams by a dermatologist recommended for persons with history of PreCancerous Actinic Keratoses  Milia - tiny firm white papules  face and chest - type of cyst - benign - Continue tretinoin 0.1% cream at bedtime to face and chest as tolerated dsp 45g 6Rf. Topical retinoid medications like tretinoin/Retin-A, adapalene/Differin,  tazarotene/Fabior, and Epiduo/Epiduo Forte can cause dryness and irritation when first started. Only apply a pea-sized amount to the entire affected area. Avoid applying it around the eyes, edges of mouth and creases at the nose. If you experience irritation, use a good moisturizer first and/or apply the medicine less often. If you are doing well with the medicine, you can increase how often you use it until you are applying every night. Be careful with sun protection while using this medication as it can make you sensitive to the sun. This medicine should not be used by pregnant women.  - may be extracted if symptomatic - observe  Milia  Related Medications tretinoin (RETIN-A) 0.1 % cream APPLY 1 APPLICATION TO AFFECTED AREAS ON FACE AND CHEST AT BEDTIME EVERY OTHER NIGHT.   Return in about 1 year (around 08/18/2024) for TBSE, Hx Dysplastic Nevus, Hx AKs.  ICherlyn Labella, CMA, am acting as scribe for Willeen Niece, MD .   Documentation: I have reviewed the above documentation for accuracy and completeness, and I agree with the above.  Willeen Niece, MD

## 2023-08-19 NOTE — Patient Instructions (Addendum)
Topical retinoid medications like tretinoin/Retin-A, adapalene/Differin, tazarotene/Fabior, and Epiduo/Epiduo Forte can cause dryness and irritation when first started. Only apply a pea-sized amount to the entire affected area. Avoid applying it around the eyes, edges of mouth and creases at the nose. If you experience irritation, use a good moisturizer first and/or apply the medicine less often. If you are doing well with the medicine, you can increase how often you use it until you are applying every night. Be careful with sun protection while using this medication as it can make you sensitive to the sun. This medicine should not be used by pregnant women.     Melanoma ABCDEs  Melanoma is the most dangerous type of skin cancer, and is the leading cause of death from skin disease.  You are more likely to develop melanoma if you: Have light-colored skin, light-colored eyes, or red or blond hair Spend a lot of time in the sun Tan regularly, either outdoors or in a tanning bed Have had blistering sunburns, especially during childhood Have a close family member who has had a melanoma Have atypical moles or large birthmarks  Early detection of melanoma is key since treatment is typically straightforward and cure rates are extremely high if we catch it early.   The first sign of melanoma is often a change in a mole or a new dark spot.  The ABCDE system is a way of remembering the signs of melanoma.  A for asymmetry:  The two halves do not match. B for border:  The edges of the growth are irregular. C for color:  A mixture of colors are present instead of an even brown color. D for diameter:  Melanomas are usually (but not always) greater than 6mm - the size of a pencil eraser. E for evolution:  The spot keeps changing in size, shape, and color.  Please check your skin once per month between visits. You can use a small mirror in front and a large mirror behind you to keep an eye on the back side or  your body.   If you see any new or changing lesions before your next follow-up, please call to schedule a visit.  Please continue daily skin protection including broad spectrum sunscreen SPF 30+ to sun-exposed areas, reapplying every 2 hours as needed when you're outdoors.   Staying in the shade or wearing long sleeves, sun glasses (UVA+UVB protection) and wide brim hats (4-inch brim around the entire circumference of the hat) are also recommended for sun protection.      Due to recent changes in healthcare laws, you may see results of your pathology and/or laboratory studies on MyChart before the doctors have had a chance to review them. We understand that in some cases there may be results that are confusing or concerning to you. Please understand that not all results are received at the same time and often the doctors may need to interpret multiple results in order to provide you with the best plan of care or course of treatment. Therefore, we ask that you please give [place] 2 business days to thoroughly review all your results before contacting the office for clarification. Should we see a critical lab result, you will be contacted sooner.   If You Need Anything After Your Visit  If you have any questions or concerns for your doctor, please call our main line at [phone] and press option 4 to reach your doctor's medical assistant. If no one answers, please leave a voicemail as  directed and we will return your call as soon as possible. Messages left after 4 pm will be answered the following business day.   You may also send [place] a message via MyChart. We typically respond to MyChart messages within 1-2 business days.  For prescription refills, please ask your pharmacy to contact our office. Our fax number is [phone].  If you have an urgent issue when the clinic is closed that cannot wait until the next business day, you can page your doctor at the number below.    Please note that while  we do our best to be available for urgent issues outside of office hours, we are not available 24/7.   If you have an urgent issue and are unable to reach [place], you may choose to seek medical care at your doctor's office, retail clinic, urgent care center, or emergency room.  If you have a medical emergency, please immediately call 911 or go to the emergency department.  Pager Numbers  - Dr. Gwen Pounds: [phone]  - Dr. Roseanne Reno: [phone]  - Dr. Katrinka Blazing: [phone]   In the event of inclement weather, please call our main line at [phone] for an update on the status of any delays or closures.  Dermatology Medication Tips: Please keep the boxes that topical medications come in in order to help keep track of the instructions about where and how to use these. Pharmacies typically print the medication instructions only on the boxes and not directly on the medication tubes.   If your medication is too expensive, please contact our office at [phone] option 4 or send [place] a message through MyChart.   We are unable to tell what your co-pay for medications will be in advance as this is different depending on your insurance coverage. However, we may be able to find a substitute medication at lower cost or fill out paperwork to get insurance to cover a needed medication.   If a prior authorization is required to get your medication covered by your insurance company, please allow [place] 1-2 business days to complete this process.  Drug prices often vary depending on where the prescription is filled and some pharmacies may offer cheaper prices.  The website www.goodrx.com contains coupons for medications through different pharmacies. The prices here do not account for what the cost may be with help from insurance (it may be cheaper with your insurance), but the website can give you the price if you did not use any insurance.  - You can print the associated coupon and take it with your  prescription to the pharmacy.  - You may also stop by our office during regular business hours and pick up a GoodRx coupon card.  - If you need your prescription sent electronically to a different pharmacy, notify our office through [facility] or by phone at [phone] option 4.     Si Usted Necesita Algo Despus de Su Visita  Tambin puede enviarnos un mensaje a travs de Clinical cytogeneticist. Por lo general respondemos a los mensajes de MyChart en el transcurso de 1 a 2 das hbiles.  Para renovar recetas, por favor pida a su farmacia que se ponga en contacto con nuestra oficina. Annie Sable de fax es [place] [phone].  Si tiene un asunto urgente cuando la clnica est cerrada y que no puede esperar hasta el siguiente da hbil, puede llamar/localizar a su doctor(a) al nmero que aparece a continuacin.   Por favor, tenga en cuenta que aunque hacemos todo lo posible  para estar disponibles para asuntos urgentes fuera del horario de [place], no estamos disponibles las 24 horas del da, los 7 [address]  [address] de la [place].   Si tiene un problema urgente y no puede comunicarse con nosotros, puede optar por buscar atencin mdica  en el consultorio de su doctor(a), en una clnica privada, en un centro de atencin urgente o en una sala de emergencias.  Si tiene Engineer, drilling, por favor llame inmediatamente al 911 o vaya a la sala de emergencias.  Nmeros de bper  - Dr. Gwen Pounds: [phone]  - Dra. Roseanne Reno: [phone]  - Dr. Katrinka Blazing: [phone]   En caso de inclemencias del tiempo, por favor llame a Lacy Duverney principal al [phone] para una actualizacin sobre el [place] de cualquier retraso o cierre.  Consejos para la medicacin en dermatologa: Por favor, guarde las cajas en las que vienen los medicamentos de uso tpico para ayudarle a seguir las instrucciones sobre dnde y cmo usarlos. Las farmacias generalmente imprimen las instrucciones del medicamento slo en las cajas y no  directamente en los tubos del [place].   Si su medicamento es muy caro, por favor, pngase en contacto con Rolm Gala llamando al [phone] y presione la opcin 4 o envenos un mensaje a travs de Clinical cytogeneticist.   No podemos decirle cul ser su copago por los medicamentos por adelantado ya que esto es diferente dependiendo de la cobertura de su seguro. Sin embargo, es posible que podamos encontrar un medicamento sustituto a Audiological scientist un formulario para que el seguro cubra el medicamento que se considera necesario.   Si se requiere una autorizacin previa para que su compaa de seguros [place] su medicamento, por favor permtanos de 1 a 2 das hbiles para completar [address]  Los precios de los medicamentos varan con frecuencia dependiendo del Environmental consultant de dnde se surte la receta y alguna farmacias pueden ofrecer precios ms baratos.  El sitio web www.goodrx.com tiene cupones para medicamentos de Health and safety inspector. Los precios aqu no tienen en cuenta lo que podra costar con la ayuda del seguro (puede ser ms barato con su seguro), pero el sitio web puede darle el precio si no utiliz Tourist information centre manager.  - Puede imprimir el cupn correspondiente y llevarlo con su receta a la farmacia.  - Tambin puede pasar por nuestra oficina durante el horario de atencin regular y Education officer, museum una tarjeta de cupones de GoodRx.  - Si necesita que su receta se enve electrnicamente a una farmacia diferente, informe a nuestra oficina a travs de MyChart de [place] o por telfono llamando al [phone] y presione la opcin 4.

## 2023-09-02 DIAGNOSIS — M9901 Segmental and somatic dysfunction of cervical region: Secondary | ICD-10-CM | POA: Diagnosis not present

## 2023-09-02 DIAGNOSIS — M9903 Segmental and somatic dysfunction of lumbar region: Secondary | ICD-10-CM | POA: Diagnosis not present

## 2023-09-02 DIAGNOSIS — M9904 Segmental and somatic dysfunction of sacral region: Secondary | ICD-10-CM | POA: Diagnosis not present

## 2023-09-02 DIAGNOSIS — M9902 Segmental and somatic dysfunction of thoracic region: Secondary | ICD-10-CM | POA: Diagnosis not present

## 2023-10-28 DIAGNOSIS — M9902 Segmental and somatic dysfunction of thoracic region: Secondary | ICD-10-CM | POA: Diagnosis not present

## 2023-10-28 DIAGNOSIS — M9904 Segmental and somatic dysfunction of sacral region: Secondary | ICD-10-CM | POA: Diagnosis not present

## 2023-10-28 DIAGNOSIS — M9903 Segmental and somatic dysfunction of lumbar region: Secondary | ICD-10-CM | POA: Diagnosis not present

## 2023-10-28 DIAGNOSIS — M9901 Segmental and somatic dysfunction of cervical region: Secondary | ICD-10-CM | POA: Diagnosis not present

## 2023-11-25 DIAGNOSIS — M9901 Segmental and somatic dysfunction of cervical region: Secondary | ICD-10-CM | POA: Diagnosis not present

## 2023-11-25 DIAGNOSIS — M9902 Segmental and somatic dysfunction of thoracic region: Secondary | ICD-10-CM | POA: Diagnosis not present

## 2023-11-25 DIAGNOSIS — M9904 Segmental and somatic dysfunction of sacral region: Secondary | ICD-10-CM | POA: Diagnosis not present

## 2023-11-25 DIAGNOSIS — M9903 Segmental and somatic dysfunction of lumbar region: Secondary | ICD-10-CM | POA: Diagnosis not present

## 2023-12-23 DIAGNOSIS — M9902 Segmental and somatic dysfunction of thoracic region: Secondary | ICD-10-CM | POA: Diagnosis not present

## 2023-12-23 DIAGNOSIS — M9901 Segmental and somatic dysfunction of cervical region: Secondary | ICD-10-CM | POA: Diagnosis not present

## 2023-12-23 DIAGNOSIS — M9904 Segmental and somatic dysfunction of sacral region: Secondary | ICD-10-CM | POA: Diagnosis not present

## 2023-12-23 DIAGNOSIS — M9903 Segmental and somatic dysfunction of lumbar region: Secondary | ICD-10-CM | POA: Diagnosis not present

## 2024-01-27 DIAGNOSIS — M9904 Segmental and somatic dysfunction of sacral region: Secondary | ICD-10-CM | POA: Diagnosis not present

## 2024-01-27 DIAGNOSIS — M9903 Segmental and somatic dysfunction of lumbar region: Secondary | ICD-10-CM | POA: Diagnosis not present

## 2024-01-27 DIAGNOSIS — M9902 Segmental and somatic dysfunction of thoracic region: Secondary | ICD-10-CM | POA: Diagnosis not present

## 2024-01-27 DIAGNOSIS — M9901 Segmental and somatic dysfunction of cervical region: Secondary | ICD-10-CM | POA: Diagnosis not present

## 2024-02-10 DIAGNOSIS — I255 Ischemic cardiomyopathy: Secondary | ICD-10-CM | POA: Diagnosis not present

## 2024-02-10 DIAGNOSIS — I10 Essential (primary) hypertension: Secondary | ICD-10-CM | POA: Diagnosis not present

## 2024-02-10 DIAGNOSIS — I509 Heart failure, unspecified: Secondary | ICD-10-CM | POA: Diagnosis not present

## 2024-02-10 DIAGNOSIS — E1122 Type 2 diabetes mellitus with diabetic chronic kidney disease: Secondary | ICD-10-CM | POA: Diagnosis not present

## 2024-02-10 DIAGNOSIS — N1832 Chronic kidney disease, stage 3b: Secondary | ICD-10-CM | POA: Diagnosis not present

## 2024-02-17 DIAGNOSIS — E785 Hyperlipidemia, unspecified: Secondary | ICD-10-CM | POA: Diagnosis not present

## 2024-02-17 DIAGNOSIS — G4730 Sleep apnea, unspecified: Secondary | ICD-10-CM | POA: Diagnosis not present

## 2024-02-17 DIAGNOSIS — N1832 Chronic kidney disease, stage 3b: Secondary | ICD-10-CM | POA: Diagnosis not present

## 2024-02-17 DIAGNOSIS — I10 Essential (primary) hypertension: Secondary | ICD-10-CM | POA: Diagnosis not present

## 2024-02-17 DIAGNOSIS — I255 Ischemic cardiomyopathy: Secondary | ICD-10-CM | POA: Diagnosis not present

## 2024-02-17 DIAGNOSIS — I509 Heart failure, unspecified: Secondary | ICD-10-CM | POA: Diagnosis not present

## 2024-02-17 DIAGNOSIS — E1122 Type 2 diabetes mellitus with diabetic chronic kidney disease: Secondary | ICD-10-CM | POA: Diagnosis not present

## 2024-02-24 DIAGNOSIS — M9904 Segmental and somatic dysfunction of sacral region: Secondary | ICD-10-CM | POA: Diagnosis not present

## 2024-02-24 DIAGNOSIS — M9903 Segmental and somatic dysfunction of lumbar region: Secondary | ICD-10-CM | POA: Diagnosis not present

## 2024-02-24 DIAGNOSIS — M9901 Segmental and somatic dysfunction of cervical region: Secondary | ICD-10-CM | POA: Diagnosis not present

## 2024-02-24 DIAGNOSIS — M9902 Segmental and somatic dysfunction of thoracic region: Secondary | ICD-10-CM | POA: Diagnosis not present

## 2024-03-09 ENCOUNTER — Ambulatory Visit (INDEPENDENT_AMBULATORY_CARE_PROVIDER_SITE_OTHER): Payer: PPO

## 2024-03-09 VITALS — BP 122/60 | Ht 65.0 in | Wt 141.0 lb

## 2024-03-09 DIAGNOSIS — Z780 Asymptomatic menopausal state: Secondary | ICD-10-CM | POA: Diagnosis not present

## 2024-03-09 DIAGNOSIS — Z0000 Encounter for general adult medical examination without abnormal findings: Secondary | ICD-10-CM | POA: Diagnosis not present

## 2024-03-09 DIAGNOSIS — Z1231 Encounter for screening mammogram for malignant neoplasm of breast: Secondary | ICD-10-CM

## 2024-03-09 NOTE — Patient Instructions (Signed)
 Dawn Roberts , Thank you for taking time out of your busy schedule to complete your Annual Wellness Visit with me. I enjoyed our conversation and look forward to speaking with you again next year. I, as well as your care team,  appreciate your ongoing commitment to your health goals. Please review the following plan we discussed and let me know if I can assist you in the future.  REFERRALS MAMMOGRAM & BONE DENSITY SCAN HAVE BEEN SENT   Follow up Visits: Next Medicare AWV with our clinical staff:   03/16/25 @ 10:50 AM BY PHONE Have you seen your provider in the last 6 months (3 months if uncontrolled diabetes)? Yes   Clinician Recommendations:  Aim for 30 minutes of exercise or brisk walking, 6-8 glasses of water, and 5 servings of fruits and vegetables each day. TAKE CARE!       This is a list of the screening recommended for you and due dates:  Health Maintenance  Topic Date Due   COVID-19 Vaccine (1) Never done   DTaP/Tdap/Td vaccine (2 - Td or Tdap) 02/11/2016   DEXA scan (bone density measurement)  12/21/2019   Complete foot exam   04/30/2022   Hemoglobin A1C  11/06/2023   Flu Shot  04/23/2024   Yearly kidney function blood test for diabetes  05/05/2024   Yearly kidney health urinalysis for diabetes  05/05/2024   Mammogram  05/26/2024   Eye exam for diabetics  06/17/2024   Medicare Annual Wellness Visit  03/09/2025   Pneumococcal Vaccine for age over 45  Completed   Hepatitis C Screening  Completed   Zoster (Shingles) Vaccine  Completed   HPV Vaccine  Aged Out   Meningitis B Vaccine  Aged Out   Colon Cancer Screening  Discontinued    Advanced directives: (ACP Link)Information on Advanced Care Planning can be found at [employer] [facility] Advance Health Care Directives Advance Health Care Directives. [email or web address]  Advance Care Planning is important because it:  [x]  Makes sure you receive the medical care that is consistent with your values, goals, and  preferences  [x]  It provides guidance to your family and loved ones and reduces their decisional burden about whether or not they are making the right decisions based on your wishes.  Follow the link provided in your after visit summary or read over the paperwork we have mailed to you to help you started getting your Advance Directives in place. If you need assistance in completing these, please reach out to us  so that we can help you!

## 2024-03-09 NOTE — Progress Notes (Signed)
 Subjective:   Dawn Roberts is a 78 y.o. who presents for a Medicare Wellness preventive visit.  As a reminder, Annual Wellness Visits don't include a physical exam, and some assessments may be limited, especially if this visit is performed virtually. We may recommend an in-person follow-up visit with your provider if needed.  Visit Complete: In person   Persons Participating in Visit: Patient.  AWV Questionnaire: No: Patient Medicare AWV questionnaire was not completed prior to this visit.  Cardiac Risk Factors include: advanced age (>13men, >15 women);diabetes mellitus;dyslipidemia;hypertension     Objective:    Today's Vitals   03/09/24 1508  BP: 122/60  Weight: 141 lb (64 kg)  Height: 5' 5 (1.651 m)   Body mass index is 23.46 kg/m.     03/09/2024    3:17 PM 03/04/2023    2:33 PM 02/03/2020    8:49 AM 02/02/2019    8:45 AM 01/29/2018    8:40 AM 09/02/2017    9:16 AM 08/19/2017    3:20 PM  Advanced Directives  Does Patient Have a Medical Advance Directive? No No No No No  No  No   Would patient like information on creating a medical advance directive? No - Patient declined  No - Patient declined No - Patient declined  No - Patient declined  No - Patient declined       Data saved with a previous flowsheet row definition    Current Medications (verified) Outpatient Encounter Medications as of 03/09/2024  Medication Sig   aspirin EC 81 MG tablet Take 81 mg by mouth daily with supper.   carvedilol (COREG) 6.25 MG tablet Take 6.25 mg by mouth 2 (two) times daily.   FARXIGA  5 MG TABS tablet Take 5 mg by mouth every morning.   losartan (COZAAR) 50 MG tablet Take 50 mg by mouth daily.    lovastatin (MEVACOR) 40 MG tablet TAKE 1 TABLET BY MOUTH EVERY DAY WITH DINNER   metFORMIN  (GLUCOPHAGE -XR) 750 MG 24 hr tablet TAKE 1 TABLET BY MOUTH EVERY DAY WITH BREAKFAST   spironolactone (ALDACTONE) 25 MG tablet Take 25 mg by mouth daily.   tretinoin  (RETIN-A ) 0.1 % cream APPLY 1  APPLICATION TO AFFECTED AREAS ON FACE AND CHEST AT BEDTIME EVERY OTHER NIGHT.   vitamin B-12 (CYANOCOBALAMIN) 1000 MCG tablet Take 1,000 mcg by mouth daily.   Azelastine -Fluticasone  137-50 MCG/ACT SUSP Place 1 spray into the nose every 12 (twelve) hours. (Patient not taking: Reported on 03/09/2024)   Bioflavonoid Products (VITAMIN C) CHEW Chew 1 tablet by mouth 3 (three) times a week. (Patient not taking: Reported on 03/09/2024)   Misc Natural Products (OSTEO BI-FLEX ADV JOINT SHIELD PO) Take 1 tablet by mouth 2 (two) times daily.  (Patient not taking: Reported on 03/09/2024)   No facility-administered encounter medications on file as of 03/09/2024.    Allergies (verified) Empagliflozin   History: Past Medical History:  Diagnosis Date   Allergy    Anemia    Arthritis    Cardiomegaly    CHF (congestive heart failure) (HCC)    Dysplastic nevus 07/16/2019   Right upper back. Mild atypia, close to margin.   Hyperlipidemia    Hypertension    Sleep apnea    MILD   Past Surgical History:  Procedure Laterality Date   BREAST EXCISIONAL BIOPSY Right 1976   BREAST MASS EXCISION Right 1976   benign   CATARACT EXTRACTION W/PHACO Left 07/29/2017   Procedure: CATARACT EXTRACTION PHACO AND INTRAOCULAR LENS PLACEMENT (  IOC)-LEFT;  Surgeon: Clair Crews, MD;  Location: [facility];  Service: Ophthalmology;  Laterality: Left;  US  00:49 AP% 15.6 CDE 7.78 Fluid pack lot # [id number]   CATARACT EXTRACTION W/PHACO Right 09/02/2017   Procedure: CATARACT EXTRACTION PHACO AND INTRAOCULAR LENS PLACEMENT (IOC);  Surgeon: Clair Crews, MD;  Location: [facility];  Service: Ophthalmology;  Laterality: Right;  US  00:58.7 AP% 18.0 CDE 10.58 Fluid Pack lot # [id number]   COLONOSCOPY  03-29-05   Dr Lorel Roes   COLONOSCOPY N/A 02/01/2015   Procedure: COLONOSCOPY;  Surgeon: Jerlean Mood, MD;  Location: [facility];  Service: Endoscopy;  Laterality: N/A;   venous closure procedure Right 07-25-05   Dr. Lorel Roes    Family History  Problem Relation Age of Onset   Diabetes Father    Ovarian cancer Sister    Breast cancer Sister 62   Healthy Sister    Healthy Brother    Healthy Sister    Breast cancer Maternal Aunt    Social History   Socioeconomic History   Marital status: Divorced    Spouse name: Not on file   Number of children: 4   Years of education: HS   Highest education level: 12th grade  Occupational History   Occupation: retired  Tobacco Use   Smoking status: Never   Smokeless tobacco: Never  Vaping Use   Vaping status: Never Used  Substance and Sexual Activity   Alcohol use: No    Alcohol/week: 0.0 standard drinks of alcohol   Drug use: No   Sexual activity: Yes    Birth control/protection: Post-menopausal    Comment: pt states, I have a friend  Other Topics Concern   Not on file  Social History Narrative   Not on file   Social Drivers of Health   Financial Resource Strain: Low Risk  (03/09/2024)   Overall Financial Resource Strain (CARDIA)    Difficulty of Paying Living Expenses: Not hard at all  Food Insecurity: No Food Insecurity (03/09/2024)   Hunger Vital Sign    Worried About Running Out of Food in the Last Year: Never true    Ran Out of Food in the Last Year: Never true  Transportation Needs: No Transportation Needs (03/09/2024)   PRAPARE - Administrator, Civil Service (Medical): No    Lack of Transportation (Non-Medical): No  Physical Activity: Sufficiently Active (03/09/2024)   Exercise Vital Sign    Days of Exercise per Week: 4 days    Minutes of Exercise per Session: 60 min  Stress: No Stress Concern Present (03/09/2024)   [employer] of Occupational Health - Occupational Stress Questionnaire    Feeling of Stress: Not at all  Social Connections: Moderately Isolated (03/09/2024)   Social Connection and Isolation Panel    Frequency of Communication with Friends and Family: More than three times a week    Frequency of Social  Gatherings with Friends and Family: More than three times a week    Attends Religious Services: More than 4 times per year    Active Member of [employer] or Organizations: No    Attends Banker Meetings: Never    Marital Status: Divorced    Tobacco Counseling Counseling given: Not Answered    Clinical Intake:  Pre-visit preparation completed: Yes  Pain : No/denies pain     BMI - recorded: 23.46 Nutritional Status: BMI of 19-24  Normal Nutritional Risks: None Diabetes: Yes CBG done?: No Did pt. bring in CBG monitor from home?: No  Lab Results  Component Value Date   HGBA1C 6.5 (H) 05/06/2023   HGBA1C 6.2 (H) 05/01/2022   HGBA1C 6.5 (H) 10/31/2021     How often do you need to have someone help you when you read instructions, pamphlets, or other written materials from your doctor or pharmacy?: 1 - Never  Interpreter Needed?: No  Information entered by :: Dellie Fergusson, LPN   Activities of Daily Living    03/09/2024    3:19 PM  In your present state of health, do you have any difficulty performing the following activities:  Hearing? 0  Vision? 0  Difficulty concentrating or making decisions? 0  Walking or climbing stairs? 0  Dressing or bathing? 0  Doing errands, shopping? 0  Preparing Food and eating ? N  Using the Toilet? N  In the past six months, have you accidently leaked urine? N  Do you have problems with loss of bowel control? N  Managing your Medications? N  Managing your Finances? N  Housekeeping or managing your Housekeeping? N    Patient Care Team: Ostwalt, Janna, PA-C as PCP - General (Physician Assistant) Michelle Aid, MD as Consulting Physician (Cardiology) Rosa College, MD as Consulting Physician (Ophthalmology) Clair Crews, MD as Referring Physician (Ophthalmology)  I have updated your Care Teams any recent Medical Services you may have received from other providers in the past year.     Assessment:   This is  a routine wellness examination for Lekia.  Hearing/Vision screen Hearing Screening - Comments:: NO AIDS Vision Screening - Comments:: [email or web address]   Goals Addressed             This Visit's Progress    DIET - EAT MORE FRUITS AND VEGETABLES         Depression Screen     03/09/2024    3:16 PM 03/04/2023    2:31 PM 05/01/2022    9:48 AM 10/31/2021    9:18 AM 04/30/2021    1:31 PM 03/23/2020    2:20 PM 02/03/2020    8:45 AM  PHQ 2/9 Scores  PHQ - 2 Score 0 0 0 0 0 0 0  PHQ- 9 Score 0  1 0 0      Fall Risk     03/09/2024    3:19 PM 03/04/2023    2:27 PM 05/01/2022    9:48 AM 10/31/2021    9:18 AM 04/30/2021    1:30 PM  Fall Risk   Falls in the past year? 0 0 0 0 0  Number falls in past yr: 0 0 0  0  Injury with Fall? 0 0 0  0  Risk for fall due to : No Fall Risks No Fall Risks   No Fall Risks  Follow up Falls evaluation completed Education provided;Falls prevention discussed   Falls evaluation completed      Data saved with a previous flowsheet row definition    MEDICARE RISK AT HOME:  Medicare Risk at Home Any stairs in or around the home?: Yes If so, are there any without handrails?: No Home free of loose throw rugs in walkways, pet beds, electrical cords, etc?: Yes Adequate lighting in your home to reduce risk of falls?: Yes Life alert?: No Use of a cane, walker or w/c?: No Grab bars in the bathroom?: Yes Shower chair or bench in shower?: No Elevated toilet seat or a handicapped toilet?: Yes  TIMED UP AND GO:  Was the test performed?  Yes  Length of time to ambulate 10 feet: 4 sec Gait steady and fast without use of assistive device  Cognitive Function: 6CIT completed        03/09/2024    3:21 PM 03/04/2023    2:36 PM 02/03/2020    8:54 AM 02/02/2019    8:53 AM 01/29/2018    8:43 AM  6CIT Screen  What Year? 0 points 0 points 0 points 0 points 0 points  What month? 0 points 0 points 0 points 0 points 0 points  What time? 0 points 0 points 0 points 0 points 0  points  Count back from 20 0 points 0 points 0 points 0 points 0 points  Months in reverse 0 points 0 points 0 points 0 points 0 points  Repeat phrase 0 points 0 points 0 points 2 points 2 points  Total Score 0 points 0 points 0 points 2 points 2 points    Immunizations Immunization History  Administered Date(s) Administered   Influenza, High Dose Seasonal PF 07/14/2014, 06/12/2015, 06/28/2017   Influenza-Unspecified 07/06/2018, 07/24/2021   Pneumococcal Conjugate-13 12/07/2014   Pneumococcal Polysaccharide-23 09/26/2011   Tdap 02/10/2006   Zoster Recombinant(Shingrix) 09/11/2018, 12/08/2018    Screening Tests Health Maintenance  Topic Date Due   COVID-19 Vaccine (1) Never done   DTaP/Tdap/Td (2 - Td or Tdap) 02/11/2016   DEXA SCAN  12/21/2019   FOOT EXAM  04/30/2022   HEMOGLOBIN A1C  11/06/2023   INFLUENZA VACCINE  04/23/2024   Diabetic kidney evaluation - eGFR measurement  05/05/2024   Diabetic kidney evaluation - Urine ACR  05/05/2024   MAMMOGRAM  05/26/2024   OPHTHALMOLOGY EXAM  06/17/2024   Medicare Annual Wellness (AWV)  03/09/2025   Pneumococcal Vaccine: 50+ Years  Completed   Hepatitis C Screening  Completed   Zoster Vaccines- Shingrix  Completed   HPV VACCINES  Aged Out   Meningococcal B Vaccine  Aged Out   Colonoscopy  Discontinued    Health Maintenance  Health Maintenance Due  Topic Date Due   COVID-19 Vaccine (1) Never done   DTaP/Tdap/Td (2 - Td or Tdap) 02/11/2016   DEXA SCAN  12/21/2019   FOOT EXAM  04/30/2022   HEMOGLOBIN A1C  11/06/2023   Health Maintenance Items Addressed: MAMMOGRAM ORDERED,DEXA ORDERED; AGED OUT OF COLONOSCOPY; UP TO DATE SHINGRIX, WANTS TO WAIT UNTIL FALL FOR PNA, TDAP; DOESN'T WANT ANY COVID SHOTS  Additional Screening:  Vision Screening: Recommended annual ophthalmology exams for early detection of glaucoma and other disorders of the eye. Would you like a referral to an eye doctor? No    Dental Screening: Recommended  annual dental exams for proper oral hygiene  Community Resource Referral / Chronic Care Management: CRR required this visit?  No   CCM required this visit?  No   Plan:    I have personally reviewed and noted the following in the patient's chart:   Medical and social history Use of alcohol, tobacco or illicit drugs  Current medications and supplements including opioid prescriptions. Patient is not currently taking opioid prescriptions. Functional ability and status Nutritional status Physical activity Advanced directives List of other physicians Hospitalizations, surgeries, and ER visits in previous 12 months Vitals Screenings to include cognitive, depression, and falls Referrals and appointments  In addition, I have reviewed and discussed with patient certain preventive protocols, quality metrics, and best practice recommendations. A written personalized care plan for preventive services as well as general preventive health recommendations were provided to patient.   Amaria Mundorf  Abby Abbott, LPN   11/28/6576   After Visit Summary: (In Person-Declined) Patient declined AVS at this time.  Notes: MAMMOGRAM & BDS ORDERED

## 2024-03-23 DIAGNOSIS — M9902 Segmental and somatic dysfunction of thoracic region: Secondary | ICD-10-CM | POA: Diagnosis not present

## 2024-03-23 DIAGNOSIS — M9903 Segmental and somatic dysfunction of lumbar region: Secondary | ICD-10-CM | POA: Diagnosis not present

## 2024-03-23 DIAGNOSIS — M9904 Segmental and somatic dysfunction of sacral region: Secondary | ICD-10-CM | POA: Diagnosis not present

## 2024-03-23 DIAGNOSIS — M9901 Segmental and somatic dysfunction of cervical region: Secondary | ICD-10-CM | POA: Diagnosis not present

## 2024-04-20 DIAGNOSIS — M9902 Segmental and somatic dysfunction of thoracic region: Secondary | ICD-10-CM | POA: Diagnosis not present

## 2024-04-20 DIAGNOSIS — M9904 Segmental and somatic dysfunction of sacral region: Secondary | ICD-10-CM | POA: Diagnosis not present

## 2024-04-20 DIAGNOSIS — M9903 Segmental and somatic dysfunction of lumbar region: Secondary | ICD-10-CM | POA: Diagnosis not present

## 2024-04-20 DIAGNOSIS — M9901 Segmental and somatic dysfunction of cervical region: Secondary | ICD-10-CM | POA: Diagnosis not present

## 2024-04-27 DIAGNOSIS — E782 Mixed hyperlipidemia: Secondary | ICD-10-CM | POA: Diagnosis not present

## 2024-04-27 DIAGNOSIS — I5022 Chronic systolic (congestive) heart failure: Secondary | ICD-10-CM | POA: Diagnosis not present

## 2024-04-27 DIAGNOSIS — I420 Dilated cardiomyopathy: Secondary | ICD-10-CM | POA: Diagnosis not present

## 2024-04-27 DIAGNOSIS — I10 Essential (primary) hypertension: Secondary | ICD-10-CM | POA: Diagnosis not present

## 2024-05-03 ENCOUNTER — Other Ambulatory Visit: Payer: Self-pay

## 2024-05-03 ENCOUNTER — Telehealth: Payer: Self-pay | Admitting: Physician Assistant

## 2024-05-03 MED ORDER — METFORMIN HCL ER 750 MG PO TB24: 750.0000 mg | ORAL_TABLET | Freq: Every day | ORAL | 4 refills | Status: AC

## 2024-05-03 NOTE — Telephone Encounter (Signed)
 [facility] faxed refill request for the following medications:   metFORMIN (GLUCOPHAGE-XR) 750 MG 24 hr tablet     Please advise.

## 2024-05-14 DIAGNOSIS — M1712 Unilateral primary osteoarthritis, left knee: Secondary | ICD-10-CM | POA: Diagnosis not present

## 2024-05-25 DIAGNOSIS — M9902 Segmental and somatic dysfunction of thoracic region: Secondary | ICD-10-CM | POA: Diagnosis not present

## 2024-05-25 DIAGNOSIS — M9903 Segmental and somatic dysfunction of lumbar region: Secondary | ICD-10-CM | POA: Diagnosis not present

## 2024-05-25 DIAGNOSIS — M9901 Segmental and somatic dysfunction of cervical region: Secondary | ICD-10-CM | POA: Diagnosis not present

## 2024-05-25 DIAGNOSIS — M9904 Segmental and somatic dysfunction of sacral region: Secondary | ICD-10-CM | POA: Diagnosis not present

## 2024-05-27 ENCOUNTER — Encounter

## 2024-05-27 ENCOUNTER — Other Ambulatory Visit

## 2024-06-23 DIAGNOSIS — M3501 Sicca syndrome with keratoconjunctivitis: Secondary | ICD-10-CM | POA: Diagnosis not present

## 2024-06-23 DIAGNOSIS — H3554 Dystrophies primarily involving the retinal pigment epithelium: Secondary | ICD-10-CM | POA: Diagnosis not present

## 2024-06-23 DIAGNOSIS — Z961 Presence of intraocular lens: Secondary | ICD-10-CM | POA: Diagnosis not present

## 2024-06-23 DIAGNOSIS — Z9889 Other specified postprocedural states: Secondary | ICD-10-CM | POA: Diagnosis not present

## 2024-06-23 DIAGNOSIS — H43813 Vitreous degeneration, bilateral: Secondary | ICD-10-CM | POA: Diagnosis not present

## 2024-06-23 LAB — OPHTHALMOLOGY REPORT-SCANNED

## 2024-06-24 ENCOUNTER — Ambulatory Visit
Admission: RE | Admit: 2024-06-24 | Discharge: 2024-06-24 | Disposition: A | Discharge status: Home / Self Care | Source: Ambulatory Visit | Attending: Physician Assistant | Admitting: Physician Assistant

## 2024-06-24 DIAGNOSIS — Z1231 Encounter for screening mammogram for malignant neoplasm of breast: Secondary | ICD-10-CM | POA: Insufficient documentation

## 2024-06-24 DIAGNOSIS — Z780 Asymptomatic menopausal state: Secondary | ICD-10-CM | POA: Diagnosis not present

## 2024-06-25 ENCOUNTER — Ambulatory Visit: Payer: Self-pay | Admitting: Physician Assistant

## 2024-06-25 DIAGNOSIS — M1712 Unilateral primary osteoarthritis, left knee: Secondary | ICD-10-CM | POA: Diagnosis not present

## 2024-06-25 NOTE — Progress Notes (Signed)
 Results given by [facility]

## 2024-06-29 ENCOUNTER — Other Ambulatory Visit: Payer: Self-pay | Admitting: Physician Assistant

## 2024-06-29 DIAGNOSIS — R928 Other abnormal and inconclusive findings on diagnostic imaging of breast: Secondary | ICD-10-CM

## 2024-06-30 ENCOUNTER — Ambulatory Visit
Admission: RE | Admit: 2024-06-30 | Discharge: 2024-06-30 | Disposition: A | Discharge status: Home / Self Care | Source: Ambulatory Visit | Attending: Physician Assistant | Admitting: Physician Assistant

## 2024-06-30 ENCOUNTER — Ambulatory Visit: Payer: Self-pay | Admitting: Physician Assistant

## 2024-06-30 DIAGNOSIS — R928 Other abnormal and inconclusive findings on diagnostic imaging of breast: Secondary | ICD-10-CM | POA: Insufficient documentation

## 2024-06-30 NOTE — Progress Notes (Signed)
 Spoke with pt and made aware. Stated has an appt with radiology at 10:20 today 06/30/24

## 2024-07-09 ENCOUNTER — Encounter: Payer: Self-pay | Admitting: Physician Assistant

## 2024-07-09 ENCOUNTER — Ambulatory Visit (INDEPENDENT_AMBULATORY_CARE_PROVIDER_SITE_OTHER): Admitting: Physician Assistant

## 2024-07-09 VITALS — BP 131/59 | HR 56 | Resp 14 | Ht 65.0 in | Wt 142.6 lb

## 2024-07-09 DIAGNOSIS — Z789 Other specified health status: Secondary | ICD-10-CM

## 2024-07-09 DIAGNOSIS — I152 Hypertension secondary to endocrine disorders: Secondary | ICD-10-CM

## 2024-07-09 DIAGNOSIS — E1169 Type 2 diabetes mellitus with other specified complication: Secondary | ICD-10-CM

## 2024-07-09 DIAGNOSIS — E1159 Type 2 diabetes mellitus with other circulatory complications: Secondary | ICD-10-CM

## 2024-07-09 DIAGNOSIS — Z794 Long term (current) use of insulin: Secondary | ICD-10-CM | POA: Diagnosis not present

## 2024-07-09 DIAGNOSIS — I83893 Varicose veins of bilateral lower extremities with other complications: Secondary | ICD-10-CM | POA: Diagnosis not present

## 2024-07-09 DIAGNOSIS — N1832 Chronic kidney disease, stage 3b: Secondary | ICD-10-CM | POA: Diagnosis not present

## 2024-07-09 DIAGNOSIS — E785 Hyperlipidemia, unspecified: Secondary | ICD-10-CM | POA: Diagnosis not present

## 2024-07-09 DIAGNOSIS — I5022 Chronic systolic (congestive) heart failure: Secondary | ICD-10-CM | POA: Diagnosis not present

## 2024-07-09 DIAGNOSIS — E1122 Type 2 diabetes mellitus with diabetic chronic kidney disease: Secondary | ICD-10-CM | POA: Diagnosis not present

## 2024-07-10 LAB — COMPREHENSIVE METABOLIC PANEL WITH GFR
ALT: 21 IU/L (ref 0–32)
AST: 22 IU/L (ref 0–40)
Albumin: 4.3 g/dL (ref 3.8–4.8)
Alkaline Phosphatase: 54 IU/L (ref 49–135)
BUN/Creatinine Ratio: 21 (ref 12–28)
BUN: 26 mg/dL (ref 8–27)
Bilirubin Total: 0.3 mg/dL (ref 0.0–1.2)
CO2: 24 mmol/L (ref 20–29)
Calcium: 10.3 mg/dL (ref 8.7–10.3)
Chloride: 104 mmol/L (ref 96–106)
Creatinine, Ser: 1.21 mg/dL — ABNORMAL HIGH (ref 0.57–1.00)
Globulin, Total: 2.6 g/dL (ref 1.5–4.5)
Glucose: 111 mg/dL — ABNORMAL HIGH (ref 70–99)
Potassium: 4.2 mmol/L (ref 3.5–5.2)
Sodium: 139 mmol/L (ref 134–144)
Total Protein: 6.9 g/dL (ref 6.0–8.5)
eGFR: 46 mL/min/1.73 — ABNORMAL LOW (ref >59)

## 2024-07-10 LAB — MICROALBUMIN / CREATININE URINE RATIO
Creatinine, Urine: 88.8 mg/dL
Microalb/Creat Ratio: 5 mg/g{creat} (ref 0–29)
Microalbumin, Urine: 4.2 ug/mL

## 2024-07-10 LAB — LIPID PANEL WITH LDL/HDL RATIO
Cholesterol, Total: 124 mg/dL (ref 100–199)
HDL: 48 mg/dL (ref >39)
LDL Chol Calc (NIH): 60 mg/dL (ref 0–99)
LDL/HDL Ratio: 1.3 ratio (ref 0.0–3.2)
Triglycerides: 82 mg/dL (ref 0–149)
VLDL Cholesterol Cal: 16 mg/dL (ref 5–40)

## 2024-07-10 LAB — HEMOGLOBIN A1C
Est. average glucose Bld gHb Est-mCnc: 134 mg/dL
Hgb A1c MFr Bld: 6.3 % — ABNORMAL HIGH (ref 4.8–5.6)

## 2024-07-11 NOTE — Progress Notes (Signed)
 "    Established Patient  Patient: Dawn Roberts   DOB: [birth year]   78 y.o. Female  MRN: [id number] Visit Date: 07/09/2024  Today's healthcare provider: Jolynn Spencer, PA-C   Chief Complaint  Patient presents with   Transition of care Medical chronic conditions    Sleep pattern: 6-7 hours, wake up middle of the night to use the restroom Exercise: active, stays outside a lot No concerns. Pt is fasting   Subjective    Dawn Roberts is a 78 y.o. female who presents today for a complete physical exam.    Discussed the use of AI scribe software for clinical note transcription with the patient, who gave verbal consent to proceed.  History of Present Illness Dawn Roberts is a 78 year old female with diabetes, hypertension, and hyperlipidemia who presents for a transition of care visit.  She manages her diabetes with metformin  and Farxiga  but does not measure blood sugar at home. She is attempting dietary changes, including eating small meals and reducing carbohydrates, though she finds it challenging to avoid sweets.  Hypertension is managed with Coreg, losartan , and spironolactone. She attributes elevated blood pressure to nervousness during clinic visits. No anxiety or depression requiring medication.  She is not on medication for hyperlipidemia, managing it through dietary changes. She has a history of heart failure and cardiomyopathy and sees a cardiologist regularly.  Chronic kidney disease is managed with nephrology follow-up and a fluid restriction of 1000 mL. A renal ultrasound was performed. She was previously on Farxiga , which she ran out of, and manages her medications carefully.  No chest pain, shortness of breath, or palpitations. Occasional tingling in her legs due to varicose veins does not affect walking. She has a history of anemia, possibly related to chronic kidney disease or iron deficiency.  A bone density test was done about a month ago, and a recent  mammogram was performed. She saw an eye doctor two weeks ago and is awaiting new glasses. A burst blood vessel in her eye last week has improved.    Last depression screening scores    03/09/2024    3:16 PM 03/04/2023    2:31 PM 05/01/2022    9:48 AM  PHQ 2/9 Scores  PHQ - 2 Score 0 0 0  PHQ- 9 Score 0  1   Last fall risk screening    07/09/2024    9:53 AM  Fall Risk   Falls in the past year? 0  Number falls in past yr: 0  Injury with Fall? 0  Risk for fall due to : No Fall Risks  Follow up Falls evaluation completed   Last Audit-C alcohol use screening    03/09/2024    3:15 PM  Alcohol Use Disorder Test (AUDIT)  1. How often do you have a drink containing alcohol? 0  2. How many drinks containing alcohol do you have on a typical day when you are drinking? 0  3. How often do you have six or more drinks on one occasion? 0  AUDIT-C Score 0   A score of 3 or more in women, and 4 or more in men indicates increased risk for alcohol abuse, EXCEPT if all of the points are from question 1   Past Medical History:  Diagnosis Date   Allergy    Anemia    Arthritis    Cardiomegaly    CHF (congestive heart failure) (HCC)    Dysplastic nevus 07/16/2019   Right upper back.  Mild atypia, close to margin.   Hyperlipidemia    Hypertension    Sleep apnea    MILD   Past Surgical History:  Procedure Laterality Date   BREAST EXCISIONAL BIOPSY Right 1976   BREAST MASS EXCISION Right 1976   benign   CATARACT EXTRACTION W/PHACO Left 07/29/2017   Procedure: CATARACT EXTRACTION PHACO AND INTRAOCULAR LENS PLACEMENT (IOC)-LEFT;  Surgeon: Jaye Fallow, MD;  Location: [facility];  Service: Ophthalmology;  Laterality: Left;  US  00:49 AP% 15.6 CDE 7.78 Fluid pack lot # [id number]   CATARACT EXTRACTION W/PHACO Right 09/02/2017   Procedure: CATARACT EXTRACTION PHACO AND INTRAOCULAR LENS PLACEMENT (IOC);  Surgeon: Jaye Fallow, MD;  Location: [facility];  Service: Ophthalmology;  Laterality:  Right;  US  00:58.7 AP% 18.0 CDE 10.58 Fluid Pack lot # [id number]   COLONOSCOPY  03-29-05   Dr Dellie   COLONOSCOPY N/A 02/01/2015   Procedure: COLONOSCOPY;  Surgeon: Louanne KANDICE Dellie, MD;  Location: [facility];  Service: Endoscopy;  Laterality: N/A;   venous closure procedure Right 07-25-05   Dr. Dellie   Social History   Socioeconomic History   Marital status: Divorced    Spouse name: Not on file   Number of children: 4   Years of education: HS   Highest education level: 12th grade  Occupational History   Occupation: retired  Tobacco Use   Smoking status: Never   Smokeless tobacco: Never  Vaping Use   Vaping status: Never Used  Substance and Sexual Activity   Alcohol use: No    Alcohol/week: 0.0 standard drinks of alcohol   Drug use: No   Sexual activity: Yes    Birth control/protection: Post-menopausal    Comment: pt states, I have a friend  Other Topics Concern   Not on file  Social History Narrative   Not on file   Social Drivers of Health   Financial Resource Strain: Low Risk  (03/09/2024)   Overall Financial Resource Strain (CARDIA)    Difficulty of Paying Living Expenses: Not hard at all  Food Insecurity: No Food Insecurity (03/09/2024)   Hunger Vital Sign    Worried About Running Out of Food in the Last Year: Never true    Ran Out of Food in the Last Year: Never true  Transportation Needs: No Transportation Needs (03/09/2024)   PRAPARE - Administrator, Civil Service (Medical): No    Lack of Transportation (Non-Medical): No  Physical Activity: Sufficiently Active (03/09/2024)   Exercise Vital Sign    Days of Exercise per Week: 4 days    Minutes of Exercise per Session: 60 min  Stress: No Stress Concern Present (03/09/2024)   [employer] of Occupational Health - Occupational Stress Questionnaire    Feeling of Stress: Not at all  Social Connections: Moderately Isolated (03/09/2024)   Social Connection and Isolation Panel    Frequency  of Communication with Friends and Family: More than three times a week    Frequency of Social Gatherings with Friends and Family: More than three times a week    Attends Religious Services: More than 4 times per year    Active Member of [employer] or Organizations: No    Attends Banker Meetings: Never    Marital Status: Divorced  Catering Manager Violence: Not At Risk (03/09/2024)   Humiliation, Afraid, Rape, and Kick questionnaire    Fear of Current or Ex-Partner: No    Emotionally Abused: No    Physically Abused: No    Sexually  Abused: No   Family Status  Relation Name Status   Father  Deceased at age 39   Sister  Deceased at age 47       ovarian cancer   Mother  Deceased at age 51   Sister  Alive   Brother  Alive   Sister  Chemical Engineer  (Not Specified)  No partnership data on file   Family History  Problem Relation Age of Onset   Diabetes Father    Ovarian cancer Sister    Breast cancer Sister 64   Healthy Sister    Healthy Brother    Healthy Sister    Breast cancer Maternal Aunt    Allergies  Allergen Reactions   Empagliflozin Other (See Comments)    Yeast infection    Patient Care Team: Donnika Kucher, PA-C as PCP - General (Physician Assistant) Hester Wolm PARAS, MD as Consulting Physician (Cardiology) Myrna Adine Anes, MD as Consulting Physician (Ophthalmology) Jaye Fallow, MD as Referring Physician (Ophthalmology)   Medications: Outpatient Medications Prior to Visit  Medication Sig   aspirin EC 81 MG tablet Take 81 mg by mouth daily with supper.   Bioflavonoid Products (VITAMIN C) CHEW Chew 1 tablet by mouth 3 (three) times a week.   carvedilol (COREG) 6.25 MG tablet Take 6.25 mg by mouth 2 (two) times daily.   FARXIGA  5 MG TABS tablet Take 5 mg by mouth every morning.   losartan  (COZAAR ) 50 MG tablet Take 50 mg by mouth daily.    lovastatin (MEVACOR) 40 MG tablet TAKE 1 TABLET BY MOUTH EVERY DAY WITH DINNER   metFORMIN   (GLUCOPHAGE -XR) 750 MG 24 hr tablet Take 1 tablet (750 mg total) by mouth daily with breakfast.   spironolactone (ALDACTONE) 25 MG tablet Take 25 mg by mouth daily.   tretinoin  (RETIN-A ) 0.1 % cream APPLY 1 APPLICATION TO AFFECTED AREAS ON FACE AND CHEST AT BEDTIME EVERY OTHER NIGHT.   vitamin B-12 (CYANOCOBALAMIN) 1000 MCG tablet Take 1,000 mcg by mouth daily.   [DISCONTINUED] Azelastine -Fluticasone  137-50 MCG/ACT SUSP Place 1 spray into the nose every 12 (twelve) hours. (Patient not taking: Reported on 03/09/2024)   [DISCONTINUED] Misc Natural Products (OSTEO BI-FLEX ADV JOINT SHIELD PO) Take 1 tablet by mouth 2 (two) times daily.  (Patient not taking: Reported on 03/09/2024)   No facility-administered medications prior to visit.    Review of Systems  All other systems reviewed and are negative.  Except see HPI     Objective    BP (!) 131/59   Pulse (!) 56   Resp 14   Ht 5' 5 (1.651 m)   Wt 142 lb 9.6 oz (64.7 kg)   SpO2 100%   BMI 23.73 kg/m      Physical Exam Vitals reviewed.  Constitutional:      General: She is not in acute distress.    Appearance: Normal appearance. She is well-developed. She is not diaphoretic.  HENT:     Head: Normocephalic and atraumatic.  Eyes:     General: No scleral icterus.    Conjunctiva/sclera: Conjunctivae normal.  Neck:     Thyroid : No thyromegaly.  Cardiovascular:     Rate and Rhythm: Normal rate and regular rhythm.     Pulses: Normal pulses.     Heart sounds: Normal heart sounds. No murmur heard. Pulmonary:     Effort: Pulmonary effort is normal. No respiratory distress.     Breath sounds: Normal breath sounds. No wheezing, rhonchi or rales.  Musculoskeletal:     Cervical back: Neck supple.     Right lower leg: No edema.     Left lower leg: No edema.  Lymphadenopathy:     Cervical: No cervical adenopathy.  Skin:    General: Skin is warm and dry.     Findings: No rash.  Neurological:     Mental Status: She is alert and  oriented to person, place, and time. Mental status is at baseline.  Psychiatric:        Mood and Affect: Mood normal.        Behavior: Behavior normal.      Results for orders placed or performed in visit on 07/09/24  Hemoglobin A1c  Result Value Ref Range   Hgb A1c MFr Bld 6.3 (H) 4.8 - 5.6 %   Est. average glucose Bld gHb Est-mCnc 134 mg/dL  Comprehensive metabolic panel with GFR  Result Value Ref Range   Glucose 111 (H) 70 - 99 mg/dL   BUN 26 8 - 27 mg/dL   Creatinine, Ser 8.78 (H) 0.57 - 1.00 mg/dL   eGFR 46 (L) >40 fO/fpw/8.26   BUN/Creatinine Ratio 21 12 - 28   Sodium 139 134 - 144 mmol/L   Potassium 4.2 3.5 - 5.2 mmol/L   Chloride 104 96 - 106 mmol/L   CO2 24 20 - 29 mmol/L   Calcium 10.3 8.7 - 10.3 mg/dL   Total Protein 6.9 6.0 - 8.5 g/dL   Albumin 4.3 3.8 - 4.8 g/dL   Globulin, Total 2.6 1.5 - 4.5 g/dL   Bilirubin Total 0.3 0.0 - 1.2 mg/dL   Alkaline Phosphatase 54 49 - 135 IU/L   AST 22 0 - 40 IU/L   ALT 21 0 - 32 IU/L  Lipid Panel With LDL/HDL Ratio  Result Value Ref Range   Cholesterol, Total 124 100 - 199 mg/dL   Triglycerides 82 0 - 149 mg/dL   HDL 48 >60 mg/dL   VLDL Cholesterol Cal 16 5 - 40 mg/dL   LDL Chol Calc (NIH) 60 0 - 99 mg/dL   LDL/HDL Ratio 1.3 0.0 - 3.2 ratio  Urine Microalbumin w/creat. ratio  Result Value Ref Range   Creatinine, Urine 88.8 Not Estab. mg/dL   Microalbumin, Urine 4.2 Not Estab. ug/mL   Microalb/Creat Ratio 5 0 - 29 mg/g creat    Assessment & Plan    Routine Health Maintenance and Physical Exam  Exercise Activities and Dietary recommendations  Goals      DIET - EAT MORE FRUITS AND VEGETABLES     DIET - INCREASE WATER INTAKE     Recommend increasing water intake to 4-6 glasses a day.       HEMOGLOBIN A1C < 7.0        Immunization History  Administered Date(s) Administered   Fluad Quad(high Dose 65+) 08/06/2022   INFLUENZA, HIGH DOSE SEASONAL PF 07/14/2014, 06/12/2015, 06/28/2017, 07/25/2023, 07/07/2024    Influenza-Unspecified 07/06/2018, 07/24/2021   Pneumococcal Conjugate-13 12/07/2014   Pneumococcal Polysaccharide-23 09/26/2011   Tdap 02/10/2006   Zoster Recombinant(Shingrix) 09/11/2018, 12/08/2018    Health Maintenance  Topic Date Due   COVID-19 Vaccine (1) Never done   DTaP/Tdap/Td vaccine (2 - Td or Tdap) 02/11/2016   Complete foot exam   04/30/2022   Hemoglobin A1C  01/07/2025   Medicare Annual Wellness Visit  03/09/2025   Eye exam for diabetics  06/23/2025   Breast Cancer Screening  06/24/2025   Yearly kidney function blood test for diabetes  07/09/2025  Yearly kidney health urinalysis for diabetes  07/09/2025   DEXA scan (bone density measurement)  06/24/2029   Pneumococcal Vaccine for age over 65  Completed   Flu Shot  Completed   Hepatitis C Screening  Completed   Zoster (Shingles) Vaccine  Completed   Meningitis B Vaccine  Aged Out   Colon Cancer Screening  Discontinued    Discussed health benefits of physical activity, and encouraged her to engage in regular exercise appropriate for her age and condition.  Transition of care from [facility] Assessment & Plan Type 2 diabetes mellitus Chronic and stable previously Managed with metformin  and Farxiga . She is making dietary changes but finds it challenging to avoid sweets. Needs to coordinate Farxiga  5 refills with nephrologist. - Continue metformin  750 and Farxiga . - Coordinate Farxiga  refills with nephrologist. - Encourage dietary changes with focus on small meals and reduced carbohydrate intake. - Discuss alternative beverages to sweet tea to prevent kidney stones. Will follow-up  Hypertension Chronic and stable Managed with Coreg 6.25 bid, losartan  50, and spironolactone25 . Blood pressure slightly elevated systolic and low diastolic, possibly due to anxiety. - Continue current antihypertensive medications. Continue lifestyle modifications Will follow-up  Heart failure with  cardiomyopathy Chronic Well-managed with Coreg, losartan , and spironolactone. Recent cardiology evaluation indicates stability. - Continue current heart failure management regimen. - Monitor for symptoms such as chest pain, shortness of breath, or rapid heart beating. 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 Managed with spironolactone and fluid restriction. - Continue spironolactone 25. - Adhere to 1000 mL fluid restriction. - Monitor kidney function and proteinuria. - Follow up with nephrology as needed.  Hyperlipidemia Chronic and stable Managed with dietary changes and lovastatin 40. Recent cardiology evaluation indicates good lipid control. - Continue dietary management for lipid control. - Periodic lipid level checks. Will follow-up  Varicose veins of lower extremities Presence of varicose veins causing occasional tingling but not pain. No significant impact on mobility.  General Health Maintenance Includes recent normal bone density test and mammogram. No history of smoking or alcohol use. Recent eye examination with new glasses pending. - Perform blood work. - Conduct foot exam. - Consider tetanus and COVID vaccinations. - Encourage regular exercise and dietary management. - Discuss alternative beverage options to sweet tea. Type 2 diabetes mellitus with stage 3b chronic kidney disease, with long-term current use of insulin (HCC) (Primary)  - Hemoglobin A1c - Comprehensive metabolic panel with GFR - Lipid Panel With LDL/HDL Ratio - Urine Microalbumin w/creat. ratio  Hypertension associated with diabetes (HCC)  - Hemoglobin A1c - Comprehensive metabolic panel with GFR - Lipid Panel With LDL/HDL Ratio - Urine Microalbumin w/creat. ratio  Hyperlipidemia associated with type 2 diabetes  mellitus (HCC)  - Hemoglobin A1c - Comprehensive metabolic panel with GFR - Lipid Panel With LDL/HDL Ratio - Urine Microalbumin w/creat. ratio  Stage 3b chronic kidney disease (HCC)  - Hemoglobin A1c - Comprehensive metabolic panel with GFR - Lipid Panel With LDL/HDL Ratio - Urine Microalbumin w/creat. ratio   No follow-ups on file.    The patient was advised to call back or seek an in-person evaluation if the symptoms worsen or if the condition fails to improve as anticipated.  I discussed the assessment and treatment plan with the patient. The patient was provided an opportunity to ask questions and all were answered. The patient agreed with the  plan and demonstrated an understanding of the instructions.  I, Tekoa Amon, PA-C have reviewed all documentation for this visit. The documentation on 07/09/2024  for the exam, diagnosis, procedures, and orders are all accurate and complete.  Jolynn Spencer, [facility], MMS [facility] [phone] (phone) [phone] (fax)  [facility] "

## 2024-07-12 ENCOUNTER — Ambulatory Visit: Payer: Self-pay | Admitting: Physician Assistant

## 2024-07-12 DIAGNOSIS — I83893 Varicose veins of bilateral lower extremities with other complications: Secondary | ICD-10-CM | POA: Insufficient documentation

## 2024-07-12 NOTE — Progress Notes (Signed)
 All labs are stable for except -Decreased kidney function with GFR of 46. Advised to drink up to 1.5 L/day, of water every day, guided by thirst avoid NSAIDs, and have renal functions check avery 6-12 months to ensure stability.     Increased A1c of 6.3, continue with current diabetic regimen and low-carb diet

## 2024-08-04 DIAGNOSIS — N1832 Chronic kidney disease, stage 3b: Secondary | ICD-10-CM | POA: Diagnosis not present

## 2024-08-04 DIAGNOSIS — I255 Ischemic cardiomyopathy: Secondary | ICD-10-CM | POA: Diagnosis not present

## 2024-08-04 DIAGNOSIS — I10 Essential (primary) hypertension: Secondary | ICD-10-CM | POA: Diagnosis not present

## 2024-08-04 DIAGNOSIS — E785 Hyperlipidemia, unspecified: Secondary | ICD-10-CM | POA: Diagnosis not present

## 2024-08-04 DIAGNOSIS — G4730 Sleep apnea, unspecified: Secondary | ICD-10-CM | POA: Diagnosis not present

## 2024-08-04 DIAGNOSIS — I509 Heart failure, unspecified: Secondary | ICD-10-CM | POA: Diagnosis not present

## 2024-08-10 DIAGNOSIS — G4730 Sleep apnea, unspecified: Secondary | ICD-10-CM | POA: Diagnosis not present

## 2024-08-10 DIAGNOSIS — I255 Ischemic cardiomyopathy: Secondary | ICD-10-CM | POA: Diagnosis not present

## 2024-08-10 DIAGNOSIS — I509 Heart failure, unspecified: Secondary | ICD-10-CM | POA: Diagnosis not present

## 2024-08-10 DIAGNOSIS — N1832 Chronic kidney disease, stage 3b: Secondary | ICD-10-CM | POA: Diagnosis not present

## 2024-08-10 DIAGNOSIS — E785 Hyperlipidemia, unspecified: Secondary | ICD-10-CM | POA: Diagnosis not present

## 2024-08-10 DIAGNOSIS — I10 Essential (primary) hypertension: Secondary | ICD-10-CM | POA: Diagnosis not present

## 2024-08-10 DIAGNOSIS — R8290 Unspecified abnormal findings in urine: Secondary | ICD-10-CM | POA: Diagnosis not present

## 2024-08-10 DIAGNOSIS — E1122 Type 2 diabetes mellitus with diabetic chronic kidney disease: Secondary | ICD-10-CM | POA: Diagnosis not present

## 2024-08-17 ENCOUNTER — Ambulatory Visit: Payer: PPO | Admitting: Dermatology

## 2024-08-31 ENCOUNTER — Ambulatory Visit: Admitting: Physician Assistant

## 2024-09-02 ENCOUNTER — Encounter: Payer: Self-pay | Admitting: Physician Assistant

## 2024-09-02 ENCOUNTER — Ambulatory Visit (INDEPENDENT_AMBULATORY_CARE_PROVIDER_SITE_OTHER): Admitting: Physician Assistant

## 2024-09-02 VITALS — BP 131/51 | HR 60 | Resp 14 | Ht 65.0 in | Wt 149.3 lb

## 2024-09-02 DIAGNOSIS — E1159 Type 2 diabetes mellitus with other circulatory complications: Secondary | ICD-10-CM | POA: Diagnosis not present

## 2024-09-02 DIAGNOSIS — Z794 Long term (current) use of insulin: Secondary | ICD-10-CM

## 2024-09-02 DIAGNOSIS — I5022 Chronic systolic (congestive) heart failure: Secondary | ICD-10-CM

## 2024-09-02 DIAGNOSIS — D649 Anemia, unspecified: Secondary | ICD-10-CM

## 2024-09-02 DIAGNOSIS — N1832 Chronic kidney disease, stage 3b: Secondary | ICD-10-CM | POA: Diagnosis not present

## 2024-09-02 DIAGNOSIS — E1169 Type 2 diabetes mellitus with other specified complication: Secondary | ICD-10-CM

## 2024-09-02 DIAGNOSIS — E1122 Type 2 diabetes mellitus with diabetic chronic kidney disease: Secondary | ICD-10-CM | POA: Diagnosis not present

## 2024-09-02 DIAGNOSIS — E785 Hyperlipidemia, unspecified: Secondary | ICD-10-CM

## 2024-09-02 DIAGNOSIS — I152 Hypertension secondary to endocrine disorders: Secondary | ICD-10-CM

## 2024-09-02 MED ORDER — LOSARTAN POTASSIUM 50 MG PO TABS: 50.0000 mg | ORAL_TABLET | Freq: Every day | ORAL | 2 refills | Status: AC

## 2024-09-02 NOTE — Progress Notes (Unsigned)
 Established patient visit  Patient: Dawn Roberts   DOB: 1945-10-10   78 y.o. Female  MRN: [id number] Visit Date: 09/02/2024  Today's healthcare provider: Jolynn Spencer, PA-C   Chief Complaint  Patient presents with   Hypertension    Reports bp at home is good. Has a wrist monitor   Subjective      Discussed the use of AI scribe software for clinical note transcription with the patient, who gave verbal consent to proceed.  History of Present Illness Dawn Roberts is a 78 year old female with diabetes, hypertension, and heart failure who presents for routine follow-up.  Her diabetes is managed with metformin  750 mg and Farxiga . She does not use a glucometer and focuses on diet, though she overate at Thanksgiving. Her last A1c was 6.4, consistent with prior results.  She takes carvedilol 6.25 mg twice daily, losartan 50 mg, and spironolactone 25 mg for hypertension and heart failure. She notes variable blood pressures with high systolic and low diastolic values but has no chest pain, shortness of breath, or palpitations.  She is followed by nephrology for kidney disease with proteinuria and anemia. Her kidney function is about 45.  She takes lovastatin for cholesterol. She has intermittent shoulder pain with reaching and occasional foot pain. She uses a spacer between her toes and her daughter helps trim her nails.       09/02/2024    2:57 PM 03/09/2024    3:16 PM 03/04/2023    2:31 PM  Depression screen PHQ 2/9  Decreased Interest 0 0 0  Down, Depressed, Hopeless 0 0 0  PHQ - 2 Score 0 0 0  Altered sleeping 0 0   Tired, decreased energy 0 0   Change in appetite 0 0   Feeling bad or failure about yourself  0 0   Trouble concentrating 0 0   Moving slowly or fidgety/restless 0 0   Suicidal thoughts 0 0   PHQ-9 Score 0 0    Difficult doing work/chores Not difficult at all Not difficult at all      Data saved with a previous flowsheet row definition      09/02/2024     2:57 PM  GAD 7 : Generalized Anxiety Score  Nervous, Anxious, on Edge 0  Control/stop worrying 0  Worry too much - different things 0  Trouble relaxing 0  Restless 0  Easily annoyed or irritable 0  Afraid - awful might happen 0  Total GAD 7 Score 0  Anxiety Difficulty Not difficult at all    Medications: Show/hide medication list[1]  Review of Systems All negative Except see HPI   {Insert previous labs (optional):23779} {See past labs  Heme  Chem  Endocrine  Serology  Results Review (optional):1}   Objective    BP (!) 131/51   Pulse 60   Resp 14   Ht 5' 5 (1.651 m)   Wt 149 lb 4.8 oz (67.7 kg)   SpO2 99%   BMI 24.84 kg/m  {Insert last BP/Wt (optional):23777}{See vitals history (optional):1}   Physical Exam Vitals reviewed.  Constitutional:      General: She is not in acute distress.    Appearance: Normal appearance. She is well-developed. She is not diaphoretic.  HENT:     Head: Normocephalic and atraumatic.  Eyes:     General: No scleral icterus.    Conjunctiva/sclera: Conjunctivae normal.  Neck:     Thyroid : No thyromegaly.  Cardiovascular:     Rate and  Rhythm: Normal rate and regular rhythm.     Pulses: Normal pulses.     Heart sounds: Normal heart sounds. No murmur heard. Pulmonary:     Effort: Pulmonary effort is normal. No respiratory distress.     Breath sounds: Normal breath sounds. No wheezing, rhonchi or rales.  Musculoskeletal:     Cervical back: Neck supple.     Right lower leg: No edema.     Left lower leg: No edema.  Lymphadenopathy:     Cervical: No cervical adenopathy.  Skin:    General: Skin is warm and dry.     Findings: No rash.  Neurological:     Mental Status: She is alert and oriented to person, place, and time. Mental status is at baseline.  Psychiatric:        Mood and Affect: Mood normal.        Behavior: Behavior normal.      No results found for any visits on 09/02/24.      Assessment & Plan Type 2 diabetes  mellitus Chronic and stable previously Well-controlled with A1c of 6.4%. Recent weight gain may affect glucose control. - Continue metformin  750 mg and Farxiga  as prescribed by nephrologist. - Encouraged dietary modifications to manage weight and glucose levels. - Advised against consuming sweetened teas and sugary drinks. Will follow-up  Heart failure Chronic and stable Managed with carvedilol, losartan, and spironolactone. Blood pressure variable with systolic hypertension and diastolic hypotension, likely due to anemia. No symptoms reported. - Continue carvedilol, losartan, and spironolactone. - Monitor blood pressure regularly. - Follow up with cardiology in February.  Chronic kidney disease, stage 3 Stage 3 with kidney function at 45%. Fluid intake restricted due to heart failure. - Continue fluid restriction to 1 liter per day. - Encouraged adequate hydration when thirsty, avoiding excessive fluid intake.  Anemia Chronic Hemoglobin level of 11 - Continue to monitor hemoglobin levels. ?hematology  Hyperlipidemia Chronic and stable Managed with lovastatin. - Continue lovastatin as prescribed. Continue low cholesterol diet and regular exercise Will follow-up  General Health Maintenance Recent mammogram normal. No retinopathy noted. - Continue routine health maintenance and screenings.  Hypertension associated with diabetes (HCC) Chronic and stable Continue current BP regimen: carvedilol 6.25 mg BID, spirinolactone 25mg , losartan 50 mg Continue low sodium diet and regular exercise Will follow-up   No orders of the defined types were placed in this encounter.   Return in about 3 months (around 12/01/2024) for chronic disease f/u, labs?.   The patient was advised to call back or seek an in-person evaluation if the symptoms worsen or if the condition fails to improve as anticipated.  I discussed the assessment and treatment plan with the patient. The patient was  provided an opportunity to ask questions and all were answered. The patient agreed with the plan and demonstrated an understanding of the instructions.  I, Etienne Mowers, PA-C have reviewed all documentation for this visit. The documentation on 09/02/2024  for the exam, diagnosis, procedures, and orders are all accurate and complete.  Jolynn Spencer, [facility], MMS [facility] [phone] (phone) [phone] (fax)  [facility]     [1]  Outpatient Medications Prior to Visit  Medication Sig   aspirin EC 81 MG tablet Take 81 mg by mouth daily with supper.   Bioflavonoid Products (VITAMIN C) CHEW Chew 1 tablet by mouth 3 (three) times a week.   carvedilol (COREG) 6.25 MG tablet Take 6.25 mg by mouth 2 (two) times daily.  FARXIGA  5 MG TABS tablet Take 5 mg by mouth every morning.   losartan (COZAAR) 50 MG tablet Take 50 mg by mouth daily.    lovastatin (MEVACOR) 40 MG tablet TAKE 1 TABLET BY MOUTH EVERY DAY WITH DINNER   metFORMIN  (GLUCOPHAGE -XR) 750 MG 24 hr tablet Take 1 tablet (750 mg total) by mouth daily with breakfast.   spironolactone (ALDACTONE) 25 MG tablet Take 25 mg by mouth daily.   tretinoin  (RETIN-A ) 0.1 % cream APPLY 1 APPLICATION TO AFFECTED AREAS ON FACE AND CHEST AT BEDTIME EVERY OTHER NIGHT.   vitamin B-12 (CYANOCOBALAMIN) 1000 MCG tablet Take 1,000 mcg by mouth daily.   No facility-administered medications prior to visit.

## 2024-10-05 ENCOUNTER — Ambulatory Visit: Admitting: Dermatology

## 2024-11-01 ENCOUNTER — Encounter: Admitting: Dermatology

## 2024-11-16 ENCOUNTER — Encounter: Admitting: Dermatology

## 2024-12-03 ENCOUNTER — Ambulatory Visit: Admitting: Physician Assistant

## 2025-03-16 ENCOUNTER — Ambulatory Visit
